# Patient Record
Sex: Female | Born: 1985
Health system: Southern US, Community
[De-identification: ages and names within clinical notes are randomized; demographics above are authoritative.]

## PROBLEM LIST (undated history)

## (undated) ENCOUNTER — Emergency Department (HOSPITAL_BASED_OUTPATIENT_CLINIC_OR_DEPARTMENT_OTHER): Admission: EM | Payer: Self-pay | Source: Home / Self Care

## (undated) DIAGNOSIS — R87619 Unspecified abnormal cytological findings in specimens from cervix uteri: Secondary | ICD-10-CM

## (undated) DIAGNOSIS — Z8759 Personal history of other complications of pregnancy, childbirth and the puerperium: Secondary | ICD-10-CM

## (undated) HISTORY — PX: DILATION AND CURETTAGE OF UTERUS: SHX78

## (undated) HISTORY — DX: Personal history of other complications of pregnancy, childbirth and the puerperium: Z87.59

## (undated) HISTORY — PX: COLPOSCOPY: SHX161

## (undated) HISTORY — PX: SIGMOIDOSCOPY: SUR1295

---

## 2011-10-29 ENCOUNTER — Encounter (HOSPITAL_COMMUNITY): Payer: Self-pay | Admitting: Emergency Medicine

## 2011-10-29 ENCOUNTER — Emergency Department (HOSPITAL_COMMUNITY)
Admission: EM | Admit: 2011-10-29 | Discharge: 2011-10-29 | Disposition: A | Discharge status: Home / Self Care | Payer: No Typology Code available for payment source | Attending: Emergency Medicine | Admitting: Emergency Medicine

## 2011-10-29 DIAGNOSIS — R51 Headache: Secondary | ICD-10-CM

## 2011-10-29 DIAGNOSIS — S335XXA Sprain of ligaments of lumbar spine, initial encounter: Secondary | ICD-10-CM

## 2011-10-29 MED ORDER — IBUPROFEN 600 MG PO TABS: 600.0000 mg | ORAL_TABLET | Freq: Four times a day (QID) | ORAL | Status: DC | PRN

## 2011-10-29 MED ORDER — CYCLOBENZAPRINE HCL 10 MG PO TABS: 10.0000 mg | ORAL_TABLET | Freq: Two times a day (BID) | ORAL | Status: DC | PRN
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
## 2011-10-29 NOTE — ED Notes (Signed)
Patient is alert and oriented x3.  She was given DC instructions and follow up visit instructions.  Patient gave verbal understanding. She was DC ambulatory under his own power to home.  V/S stable.  He was not showing any signs of distress on DC 

## 2011-10-29 NOTE — ED Notes (Signed)
Patient is alert and oriented x3.  She is complaining of headache rated 7 of 10 with  Dizziness and lower back pain.

## 2011-11-08 NOTE — ED Provider Notes (Signed)
Medical screening examination/treatment/procedure(s) were performed by non-physician practitioner and as supervising physician I was immediately available for consultation/collaboration.  John-Adam Harshita Bernales, M.D.     John-Adam Shayn Madole, MD 11/08/11 1422 

## 2013-09-01 DIAGNOSIS — K625 Hemorrhage of anus and rectum: Secondary | ICD-10-CM | POA: Insufficient documentation

## 2013-10-08 DIAGNOSIS — IMO0002 Reserved for concepts with insufficient information to code with codable children: Secondary | ICD-10-CM | POA: Insufficient documentation

## 2016-08-30 DIAGNOSIS — Z1321 Encounter for screening for nutritional disorder: Secondary | ICD-10-CM | POA: Diagnosis not present

## 2016-08-30 DIAGNOSIS — Z309 Encounter for contraceptive management, unspecified: Secondary | ICD-10-CM | POA: Diagnosis not present

## 2016-08-30 DIAGNOSIS — Z0000 Encounter for general adult medical examination without abnormal findings: Secondary | ICD-10-CM | POA: Diagnosis not present

## 2016-08-30 DIAGNOSIS — Z6820 Body mass index (BMI) 20.0-20.9, adult: Secondary | ICD-10-CM | POA: Diagnosis not present

## 2016-08-30 DIAGNOSIS — Z01419 Encounter for gynecological examination (general) (routine) without abnormal findings: Secondary | ICD-10-CM | POA: Diagnosis not present

## 2016-08-30 DIAGNOSIS — Z113 Encounter for screening for infections with a predominantly sexual mode of transmission: Secondary | ICD-10-CM | POA: Diagnosis not present

## 2016-09-26 ENCOUNTER — Ambulatory Visit (INDEPENDENT_AMBULATORY_CARE_PROVIDER_SITE_OTHER): Payer: BLUE CROSS/BLUE SHIELD

## 2016-09-26 DIAGNOSIS — N912 Amenorrhea, unspecified: Secondary | ICD-10-CM

## 2016-09-26 DIAGNOSIS — Z3201 Encounter for pregnancy test, result positive: Secondary | ICD-10-CM | POA: Diagnosis not present

## 2016-09-26 LAB — POCT URINE PREGNANCY: PREG TEST UR: POSITIVE — AB

## 2016-09-26 NOTE — Progress Notes (Signed)
Pt here for a pregnancy test. Test today is positive. Last LMP 08/24/16. Pt is [redacted]w[redacted]d. EDD 05/31/17

## 2016-10-16 ENCOUNTER — Encounter: Payer: Self-pay | Admitting: Obstetrics

## 2016-10-16 ENCOUNTER — Ambulatory Visit (INDEPENDENT_AMBULATORY_CARE_PROVIDER_SITE_OTHER): Payer: BLUE CROSS/BLUE SHIELD | Admitting: Obstetrics

## 2016-10-16 ENCOUNTER — Telehealth: Payer: Self-pay | Admitting: *Deleted

## 2016-10-16 VITALS — BP 115/76 | HR 80 | Ht 65.5 in | Wt 130.4 lb

## 2016-10-16 DIAGNOSIS — Z113 Encounter for screening for infections with a predominantly sexual mode of transmission: Secondary | ICD-10-CM | POA: Diagnosis not present

## 2016-10-16 DIAGNOSIS — N898 Other specified noninflammatory disorders of vagina: Secondary | ICD-10-CM

## 2016-10-16 NOTE — Progress Notes (Signed)
Patient ID: Kristi [name], female   DOB: 1985/09/05, 31 y.o.   MRN: [id number]  Chief Complaint  Patient presents with  . GYN    HPI Kristi [name] is a 31 y.o. female.  Vaginal discharge and irritation.  Denies vaginal odor.  Denies pelvic pain. HPI  History reviewed. No pertinent past medical history.  History reviewed. No pertinent surgical history.  History reviewed. No pertinent family history.  Social History Social History  Substance Use Topics  . Smoking status: Never Smoker  . Smokeless tobacco: Never Used  . Alcohol use No     Comment: Occasionally     No Known Allergies  Current Outpatient Prescriptions  Medication Sig Dispense Refill  . Prenatal Vit-Fe Fumarate-FA (PRENATAL MULTIVITAMIN) TABS tablet Take 1 tablet by mouth daily at 12 noon.    . cyclobenzaprine (FLEXERIL) 10 MG tablet Take 1 tablet (10 mg total) by mouth 2 (two) times daily as needed for muscle spasms. (Patient not taking: Reported on 10/16/2016) 20 tablet 0  . ibuprofen (ADVIL,MOTRIN) 600 MG tablet Take 1 tablet (600 mg total) by mouth every 6 (six) hours as needed for pain. (Patient not taking: Reported on 10/16/2016) 30 tablet 0   No current facility-administered medications for this visit.     Review of Systems Review of Systems Constitutional: negative for fatigue and weight loss Respiratory: negative for cough and wheezing Cardiovascular: negative for chest pain, fatigue and palpitations Gastrointestinal: negative for abdominal pain and change in bowel habits Genitourinary:positive for vaginal discharge and irritation Integument/breast: negative for nipple discharge Musculoskeletal:negative for myalgias Neurological: negative for gait problems and tremors Behavioral/Psych: negative for abusive relationship, depression Endocrine: negative for temperature intolerance      Blood pressure 115/76, pulse 80, height 5' 5.5" (1.664 m), weight 130 lb 6.4 oz (59.1 kg).  Physical Exam Physical  Exam           General:  Alert and no distress Abdomen:  normal findings: no organomegaly, soft, non-tender and no hernia  Pelvis:  External genitalia: normal general appearance Urinary system: urethral meatus normal and bladder without fullness, nontender Vaginal: normal without tenderness, induration or masses Cervix: normal appearance Adnexa: normal bimanual exam Uterus: anteverted and non-tender, normal size    50% of 15 min visit spent on counseling and coordination of care.    Data Reviewed Wet prep and Cultures  Assessment        1. Vaginal discharge Rx: - Cervicovaginal ancillary only  2. Vaginal irritation Rx: - Cervicovaginal ancillary only   Plan    No orders of the defined types were placed in this encounter.  Meds ordered this encounter  Medications  . Prenatal Vit-Fe Fumarate-FA (PRENATAL MULTIVITAMIN) TABS tablet    Sig: Take 1 tablet by mouth daily at 12 noon.

## 2016-10-16 NOTE — Telephone Encounter (Signed)
Patient is having BV/yeast- she has a NOB appointment on 10/10.   Appointment to address the discharge today.

## 2016-10-16 NOTE — Progress Notes (Signed)
Patient is in the office and reports vaginal discharge and discomfort. Pt has NOB appt scheduled for 11-08-16.

## 2016-10-17 ENCOUNTER — Other Ambulatory Visit: Payer: Self-pay | Admitting: Obstetrics

## 2016-10-17 DIAGNOSIS — B3731 Acute candidiasis of vulva and vagina: Secondary | ICD-10-CM

## 2016-10-17 DIAGNOSIS — B373 Candidiasis of vulva and vagina: Secondary | ICD-10-CM

## 2016-10-17 LAB — CERVICOVAGINAL ANCILLARY ONLY
BACTERIAL VAGINITIS: NEGATIVE
CANDIDA VAGINITIS: POSITIVE — AB
CHLAMYDIA, DNA PROBE: NEGATIVE
NEISSERIA GONORRHEA: NEGATIVE
TRICH (WINDOWPATH): NEGATIVE

## 2016-10-17 MED ORDER — FLUCONAZOLE 150 MG PO TABS: 150.0000 mg | ORAL_TABLET | Freq: Once | ORAL | 0 refills | Status: AC

## 2016-11-08 ENCOUNTER — Encounter: Payer: Self-pay | Admitting: Certified Nurse Midwife

## 2016-11-08 ENCOUNTER — Ambulatory Visit (INDEPENDENT_AMBULATORY_CARE_PROVIDER_SITE_OTHER): Payer: BLUE CROSS/BLUE SHIELD | Admitting: Certified Nurse Midwife

## 2016-11-08 VITALS — BP 111/74 | HR 97 | Wt 134.0 lb

## 2016-11-08 DIAGNOSIS — N760 Acute vaginitis: Secondary | ICD-10-CM

## 2016-11-08 DIAGNOSIS — Z3400 Encounter for supervision of normal first pregnancy, unspecified trimester: Secondary | ICD-10-CM | POA: Diagnosis not present

## 2016-11-08 DIAGNOSIS — Z124 Encounter for screening for malignant neoplasm of cervix: Secondary | ICD-10-CM

## 2016-11-08 DIAGNOSIS — Z3490 Encounter for supervision of normal pregnancy, unspecified, unspecified trimester: Secondary | ICD-10-CM

## 2016-11-08 DIAGNOSIS — Z113 Encounter for screening for infections with a predominantly sexual mode of transmission: Secondary | ICD-10-CM

## 2016-11-08 DIAGNOSIS — Z3401 Encounter for supervision of normal first pregnancy, first trimester: Secondary | ICD-10-CM | POA: Diagnosis not present

## 2016-11-08 DIAGNOSIS — B9689 Other specified bacterial agents as the cause of diseases classified elsewhere: Secondary | ICD-10-CM

## 2016-11-08 DIAGNOSIS — R87629 Unspecified abnormal cytological findings in specimens from vagina: Secondary | ICD-10-CM | POA: Insufficient documentation

## 2016-11-08 NOTE — Progress Notes (Signed)
Subjective:    Kristi [name] is being seen today for her first obstetrical visit.  This is a planned pregnancy. She is at [redacted]w[redacted]d gestation. Her obstetrical history is significant for none. Relationship with FOB: significant other, living together. Patient does intend to breast feed. Pregnancy history fully reviewed.  The information documented in the HPI was reviewed and verified.  Menstrual History: OB History    Gravida Para Term Preterm AB Living   1             SAB TAB Ectopic Multiple Live Births                   Patient's last menstrual period was 08/24/2016. Period Pattern: Regular  Past Medical History:  Diagnosis Date  . Vaginal Pap smear, abnormal     Past Surgical History:  Procedure Laterality Date  . COLPOSCOPY  2013-2014     (Not in a hospital admission) No Known Allergies  Social History  Substance Use Topics  . Smoking status: Never Smoker  . Smokeless tobacco: Never Used  . Alcohol use No     Comment: Occasionally     Family History  Problem Relation Age of Onset  . Hypertension Mother   . Stroke Mother   . Hypertension Father   . Cancer Maternal Grandfather   . Cancer Paternal Grandmother        ovarian  . Diabetes Paternal Grandmother      Review of Systems Constitutional: negative for weight loss Gastrointestinal: negative for vomiting Genitourinary:negative for genital lesions and vaginal discharge and dysuria Musculoskeletal:negative for back pain Behavioral/Psych: negative for abusive relationship, depression, illegal drug usage and tobacco use    Objective:    BP 111/74   Pulse 97   Wt 134 lb (60.8 kg)   LMP 08/24/2016   BMI 21.96 kg/m  General Appearance:    Alert, cooperative, no distress, appears stated age  Head:    Normocephalic, without obvious abnormality, atraumatic  Eyes:    PERRL, conjunctiva/corneas clear, EOM's intact, fundi    benign, both eyes  Ears:    Normal TM's and external ear canals, both ears  Nose:    Nares normal, septum midline, mucosa normal, no drainage    or sinus tenderness  Throat:   Lips, mucosa, and tongue normal; teeth and gums normal  Neck:   Supple, symmetrical, trachea midline, no adenopathy;    thyroid:  no enlargement/tenderness/nodules; no carotid   bruit or JVD  Back:     Symmetric, no curvature, ROM normal, no CVA tenderness  Lungs:     Clear to auscultation bilaterally, respirations unlabored  Chest Wall:    No tenderness or deformity   Heart:    Regular rate and rhythm, S1 and S2 normal, no murmur, rub   or gallop  Breast Exam:    No tenderness, masses, or nipple abnormality  Abdomen:     Soft, non-tender, bowel sounds active all four quadrants,    no masses, no organomegaly  Genitalia:    Normal female without lesion, discharge or tenderness  Extremities:   Extremities normal, atraumatic, no cyanosis or edema  Pulses:   2+ and symmetric all extremities  Skin:   Skin color, texture, turgor normal, no rashes or lesions  Lymph nodes:   Cervical, supraclavicular, and axillary nodes normal  Neurologic:   CNII-XII intact, normal strength, sensation and reflexes    throughout  Cervix:  Long, thick, closed and posterior.  FHR: 175 by doppler.  Size c/w LMP.     Lab Review Urine pregnancy test Labs reviewed yes Radiologic studies reviewed no  Assessment & Plan    Pregnancy at [redacted]w[redacted]d weeks    1. Supervision of normal first pregnancy, antepartum    - Hemoglobinopathy evaluation - Varicella zoster antibody, IgG - MaterniT21 PLUS Core+SCA - Hemoglobin A1c - Culture, OB Urine - Cytology - PAP - Cervicovaginal ancillary only  2. Prenatal care, antepartum    - Obstetric Panel, Including HIV - Inheritest Society Guided     Prenatal vitamins.  Counseling provided regarding continued use of seat belts, cessation of alcohol consumption, smoking or use of illicit drugs; infection precautions i.e., influenza/TDAP immunizations, toxoplasmosis,CMV,  parvovirus, listeria and varicella; workplace safety, exercise during pregnancy; routine dental care, safe medications, sexual activity, hot tubs, saunas, pools, travel, caffeine use, fish and methlymercury, potential toxins, hair treatments, varicose veins Weight gain recommendations per IOM guidelines reviewed: underweight/BMI< 18.5--> gain 28 - 40 lbs; normal weight/BMI 18.5 - 24.9--> gain 25 - 35 lbs; overweight/BMI 25 - 29.9--> gain 15 - 25 lbs; obese/BMI >30->gain  11 - 20 lbs Problem list reviewed and updated. FIRST/CF mutation testing/NIPT/QUAD SCREEN/fragile X/Ashkenazi Jewish population testing/Spinal muscular atrophy discussed: ordered. Role of ultrasound in pregnancy discussed; fetal survey: requested. Amniocentesis discussed: not indicated.   Orders Placed This Encounter  Procedures  . Culture, OB Urine  . Obstetric Panel, Including HIV  . Inheritest Society Guided  . Hemoglobinopathy evaluation  . Varicella zoster antibody, IgG  . MaterniT21 PLUS Core+SCA    Order Specific Question:   Is the patient insulin dependent?    Answer:   No    Order Specific Question:   Please enter gestational age. This should be expressed as weeks AND days, i.e. 16w 6d. Enter weeks here. Enter days in next question.    Answer:   20    Order Specific Question:   Please enter gestational age. This should be expressed as weeks AND days, i.e. 16w 6d. Enter days here. Enter weeks in previous question.    Answer:   6    Order Specific Question:   How was gestational age calculated?    Answer:   LMP    Order Specific Question:   Please give the date of LMP OR Ultrasound OR Estimated date of delivery.    Answer:   05/31/2017    Order Specific Question:   Number of Fetuses (Type of Pregnancy):    Answer:   1    Order Specific Question:   Indications for performing the test? (please choose all that apply):    Answer:   Routine screening    Order Specific Question:   Other Indications? (Y=Yes, N=No)     Answer:   N    Order Specific Question:   If this is a repeat specimen, please indicate the reason:    Answer:   Not indicated    Order Specific Question:   Please specify the patient's race: (C=White/Caucasion, B=Black, I=Native American, A=Asian, H=Hispanic, O=Other, U=Unknown)    Answer:   B    Order Specific Question:   Donor Egg - indicate if the egg was obtained from in vitro fertilization.    Answer:   N    Order Specific Question:   Age of Egg Donor.    Answer:   28    Order Specific Question:   Prior Down Syndrome/ONTD screening during current pregnancy.  Answer:   N    Order Specific Question:   Prior First Trimester Testing    Answer:   N    Order Specific Question:   Prior Second Trimester Testing    Answer:   N    Order Specific Question:   Family History of Neural Tube Defects    Answer:   N    Order Specific Question:   Prior Pregnancy with Down Syndrome    Answer:   N    Order Specific Question:   Please give the patient's weight (in pounds)    Answer:   134  . Hemoglobin A1c    Follow up in 4 weeks. 50% of 30 min visit spent on counseling and coordination of care.

## 2016-11-09 ENCOUNTER — Other Ambulatory Visit: Payer: Self-pay

## 2016-11-09 LAB — CERVICOVAGINAL ANCILLARY ONLY
Bacterial vaginitis: POSITIVE — AB
CANDIDA VAGINITIS: NEGATIVE
CHLAMYDIA, DNA PROBE: NEGATIVE
NEISSERIA GONORRHEA: NEGATIVE

## 2016-11-09 MED ORDER — METRONIDAZOLE 0.75 % VA GEL: 1.0000 | Freq: Two times a day (BID) | VAGINAL | 0 refills | Status: DC

## 2016-11-09 NOTE — Addendum Note (Signed)
Addended by: Orvilla Cornwall A on: 11/09/2016 01:17 PM   Modules accepted: Orders

## 2016-11-10 LAB — HEMOGLOBINOPATHY EVALUATION
HEMOGLOBIN A2 QUANTITATION: 2.3 % (ref 1.8–3.2)
HEMOGLOBIN F QUANTITATION: 0 % (ref 0.0–2.0)
HGB C: 0 %
HGB S: 0 %
HGB VARIANT: 0 %
Hgb A: 97.7 % (ref 96.4–98.8)

## 2016-11-10 LAB — CULTURE, OB URINE

## 2016-11-10 LAB — OBSTETRIC PANEL, INCLUDING HIV
ANTIBODY SCREEN: NEGATIVE
BASOS ABS: 0 10*3/uL (ref 0.0–0.2)
BASOS: 0 %
EOS (ABSOLUTE): 0.3 10*3/uL (ref 0.0–0.4)
Eos: 4 %
HEMATOCRIT: 33.5 % — AB (ref 34.0–46.6)
HIV SCREEN 4TH GENERATION: NONREACTIVE
Hemoglobin: 11.8 g/dL (ref 11.1–15.9)
Hepatitis B Surface Ag: NEGATIVE
Immature Grans (Abs): 0.1 10*3/uL (ref 0.0–0.1)
Immature Granulocytes: 1 %
LYMPHS ABS: 1.3 10*3/uL (ref 0.7–3.1)
Lymphs: 19 %
MCH: 31.4 pg (ref 26.6–33.0)
MCHC: 35.2 g/dL (ref 31.5–35.7)
MCV: 89 fL (ref 79–97)
MONOCYTES: 5 %
MONOS ABS: 0.3 10*3/uL (ref 0.1–0.9)
NEUTROS ABS: 5.2 10*3/uL (ref 1.4–7.0)
Neutrophils: 71 %
PLATELETS: 220 10*3/uL (ref 150–379)
RBC: 3.76 x10E6/uL — ABNORMAL LOW (ref 3.77–5.28)
RDW: 13.6 % (ref 12.3–15.4)
RPR Ser Ql: NONREACTIVE
RUBELLA: 1.89 {index} (ref >0.99)
Rh Factor: NEGATIVE
WBC: 7.2 10*3/uL (ref 3.4–10.8)

## 2016-11-10 LAB — URINE CULTURE, OB REFLEX

## 2016-11-10 LAB — CYTOLOGY - PAP: Diagnosis: NEGATIVE

## 2016-11-10 LAB — VARICELLA ZOSTER ANTIBODY, IGG: VARICELLA: 1498 {index} (ref >165)

## 2016-11-10 LAB — HEMOGLOBIN A1C
ESTIMATED AVERAGE GLUCOSE: 94 mg/dL
Hgb A1c MFr Bld: 4.9 % (ref 4.8–5.6)

## 2016-11-13 ENCOUNTER — Other Ambulatory Visit: Payer: Self-pay | Admitting: Certified Nurse Midwife

## 2016-11-13 DIAGNOSIS — Z3400 Encounter for supervision of normal first pregnancy, unspecified trimester: Secondary | ICD-10-CM

## 2016-11-13 DIAGNOSIS — O26899 Other specified pregnancy related conditions, unspecified trimester: Secondary | ICD-10-CM

## 2016-11-13 DIAGNOSIS — Z6791 Unspecified blood type, Rh negative: Secondary | ICD-10-CM | POA: Insufficient documentation

## 2016-11-13 LAB — MATERNIT21 PLUS CORE+SCA
CHROMOSOME 18: NEGATIVE
Chromosome 13: NEGATIVE
Chromosome 21: NEGATIVE
Y CHROMOSOME: DETECTED

## 2016-11-20 LAB — INHERITEST SOCIETY GUIDED

## 2016-11-21 ENCOUNTER — Other Ambulatory Visit: Payer: Self-pay | Admitting: Certified Nurse Midwife

## 2016-11-22 ENCOUNTER — Other Ambulatory Visit: Payer: Self-pay | Admitting: Certified Nurse Midwife

## 2016-11-22 DIAGNOSIS — Z3400 Encounter for supervision of normal first pregnancy, unspecified trimester: Secondary | ICD-10-CM

## 2016-11-28 ENCOUNTER — Telehealth: Payer: Self-pay | Admitting: Pediatrics

## 2016-11-28 MED ORDER — TERCONAZOLE 0.8 % VA CREA: 1.0000 | TOPICAL_CREAM | Freq: Every day | VAGINAL | 0 refills | Status: DC

## 2016-11-28 NOTE — Telephone Encounter (Signed)
Pt states she has yeast infection. Terazol sent in per standing order.

## 2016-12-05 ENCOUNTER — Encounter: Payer: Self-pay | Admitting: Certified Nurse Midwife

## 2016-12-05 ENCOUNTER — Ambulatory Visit (INDEPENDENT_AMBULATORY_CARE_PROVIDER_SITE_OTHER): Payer: BLUE CROSS/BLUE SHIELD | Admitting: Certified Nurse Midwife

## 2016-12-05 VITALS — BP 106/68 | HR 85 | Wt 136.4 lb

## 2016-12-05 DIAGNOSIS — O26899 Other specified pregnancy related conditions, unspecified trimester: Secondary | ICD-10-CM

## 2016-12-05 DIAGNOSIS — Z6791 Unspecified blood type, Rh negative: Secondary | ICD-10-CM

## 2016-12-05 DIAGNOSIS — O09899 Supervision of other high risk pregnancies, unspecified trimester: Secondary | ICD-10-CM

## 2016-12-05 DIAGNOSIS — Z3400 Encounter for supervision of normal first pregnancy, unspecified trimester: Secondary | ICD-10-CM

## 2016-12-05 NOTE — Progress Notes (Signed)
Patient is in the office for ob visit, denies pain.

## 2016-12-05 NOTE — Progress Notes (Signed)
   PRENATAL VISIT NOTE  Subjective:  Kristi [name] is a 31 y.o. G1P0 at 269w5d being seen today for ongoing prenatal care.  She is currently monitored for the following issues for this low-risk pregnancy and has Supervision of normal first pregnancy, antepartum and Rh negative state in antepartum period on their problem list.  Patient reports no complaints.  Contractions: Not present. Vag. Bleeding: None.   . Denies leaking of fluid.   The following portions of the patient's history were reviewed and updated as appropriate: allergies, current medications, past family history, past medical history, past social history, past surgical history and problem list. Problem list updated.  Objective:   Vitals:   12/05/16 1018  BP: 106/68  Pulse: 85  Weight: 136 lb 6.4 oz (61.9 kg)    Fetal Status: Fetal Heart Rate (bpm): 155; doppler         General:  Alert, oriented and cooperative. Patient is in no acute distress.  Skin: Skin is warm and dry. No rash noted.   Cardiovascular: Normal heart rate noted  Respiratory: Normal respiratory effort, no problems with respiration noted  Abdomen: Soft, gravid, appropriate for gestational age.  Pain/Pressure: Absent     Pelvic: Cervical exam deferred        Extremities: Normal range of motion.  Edema: None  Mental Status:  Normal mood and affect. Normal behavior. Normal judgment and thought content.   Assessment and Plan:  Pregnancy: G1P0 at 6369w5d  1. Supervision of normal first pregnancy, antepartum     Doing well.    2. Rh negative state in antepartum period     Rhogam at 28 wks.   Preterm labor symptoms and general obstetric precautions including but not limited to vaginal bleeding, contractions, leaking of fluid and fetal movement were reviewed in detail with the patient. Please refer to After Visit Summary for other counseling recommendations.  Return in about 4 weeks (around 01/02/2017) for ROB.   Roe Coombsachelle A Denney, CNM

## 2016-12-06 ENCOUNTER — Encounter: Payer: BLUE CROSS/BLUE SHIELD | Admitting: Certified Nurse Midwife

## 2016-12-26 ENCOUNTER — Other Ambulatory Visit: Payer: Self-pay | Admitting: Certified Nurse Midwife

## 2016-12-26 ENCOUNTER — Other Ambulatory Visit: Payer: Self-pay

## 2016-12-26 MED ORDER — TERCONAZOLE 0.4 % VA CREA: 1.0000 | TOPICAL_CREAM | Freq: Every day | VAGINAL | 0 refills | Status: DC

## 2016-12-27 ENCOUNTER — Other Ambulatory Visit: Payer: Self-pay | Admitting: Certified Nurse Midwife

## 2016-12-27 DIAGNOSIS — B9689 Other specified bacterial agents as the cause of diseases classified elsewhere: Secondary | ICD-10-CM

## 2016-12-27 DIAGNOSIS — N760 Acute vaginitis: Principal | ICD-10-CM

## 2016-12-27 MED ORDER — METRONIDAZOLE 0.75 % VA GEL: 1.0000 | Freq: Two times a day (BID) | VAGINAL | 0 refills | Status: DC

## 2016-12-28 ENCOUNTER — Encounter (HOSPITAL_COMMUNITY): Payer: Self-pay | Admitting: Certified Nurse Midwife

## 2017-01-04 ENCOUNTER — Encounter: Payer: Self-pay | Admitting: Certified Nurse Midwife

## 2017-01-04 ENCOUNTER — Ambulatory Visit (INDEPENDENT_AMBULATORY_CARE_PROVIDER_SITE_OTHER): Payer: BLUE CROSS/BLUE SHIELD | Admitting: Certified Nurse Midwife

## 2017-01-04 VITALS — BP 119/67 | HR 106 | Wt 144.0 lb

## 2017-01-04 DIAGNOSIS — O26899 Other specified pregnancy related conditions, unspecified trimester: Secondary | ICD-10-CM

## 2017-01-04 DIAGNOSIS — O09892 Supervision of other high risk pregnancies, second trimester: Secondary | ICD-10-CM

## 2017-01-04 DIAGNOSIS — Z6791 Unspecified blood type, Rh negative: Secondary | ICD-10-CM

## 2017-01-04 DIAGNOSIS — Z3402 Encounter for supervision of normal first pregnancy, second trimester: Secondary | ICD-10-CM

## 2017-01-04 DIAGNOSIS — Z3400 Encounter for supervision of normal first pregnancy, unspecified trimester: Secondary | ICD-10-CM

## 2017-01-04 NOTE — Progress Notes (Signed)
   PRENATAL VISIT NOTE  Subjective:  Kristi [name] is a 31 y.o. G1P0 at 2548w0d being seen today for ongoing prenatal care.  She is currently monitored for the following issues for this low-risk pregnancy and has Supervision of normal first pregnancy, antepartum and Rh negative state in antepartum period on their problem list.  Patient reports no complaints.  Contractions: Not present. Vag. Bleeding: None.  Movement: Present. Denies leaking of fluid.   The following portions of the patient's history were reviewed and updated as appropriate: allergies, current medications, past family history, past medical history, past social history, past surgical history and problem list. Problem list updated.  Objective:   Vitals:   01/04/17 1014  BP: 119/67  Pulse: (!) 106  Weight: 144 lb (65.3 kg)    Fetal Status: Fetal Heart Rate (bpm): 152; doppler Fundal Height: 19 cm Movement: Present     General:  Alert, oriented and cooperative. Patient is in no acute distress.  Skin: Skin is warm and dry. No rash noted.   Cardiovascular: Normal heart rate noted  Respiratory: Normal respiratory effort, no problems with respiration noted  Abdomen: Soft, gravid, appropriate for gestational age.  Pain/Pressure: Absent     Pelvic: Cervical exam deferred        Extremities: Normal range of motion.     Mental Status:  Normal mood and affect. Normal behavior. Normal judgment and thought content.   Assessment and Plan:  Pregnancy: G1P0 at 2948w0d  1. Supervision of normal first pregnancy, antepartum      Doing well  2. Rh negative state in antepartum period     Rhogam @28  wks  Preterm labor symptoms and general obstetric precautions including but not limited to vaginal bleeding, contractions, leaking of fluid and fetal movement were reviewed in detail with the patient. Please refer to After Visit Summary for other counseling recommendations.  Return in about 4 weeks (around 02/01/2017) for ROB.   Roe Coombsachelle A  Lurline Caver, CNM

## 2017-01-04 NOTE — Patient Instructions (Signed)
AREA PEDIATRIC/FAMILY PRACTICE PHYSICIANS  [facility] [address], [address] [place], [place]  [address] Phone - [phone]   Fax - [phone]  [facility] [address] [place], [place] [address] Phone - [phone]   Fax - [phone]  [facility] [address] [place], [place]  [address] Phone - [phone]   Fax - [phone]  [facility] [address] [place], [place]  [address] Phone - [phone]   Fax - [phone]  [facility] [address] [place], [place]  [address] Phone - [phone]   Fax - [phone]  [facility] [address], [address] [place], [place]  [address] Phone - [phone]   Fax - [phone]  [facility] [address] [place], [place]  [address] Phone - [phone]   Fax - [phone]  [facility] [address], [address] [place], [place]  [address] Phone - [phone]   Fax - [phone]  [facility] [address] [place], [place]  [address] Phone - [phone]   Fax - [phone] [facility] [address] [place], [place]  [address] Phone - [phone]   Fax - [phone]  [facility] [address] [place], [place]  [address] Phone - [phone]   Fax - [phone]  [facility] [address] [place], [place]  [address] Phone - [phone]   Fax - [phone]  [facility] [address], [address] [place], [place]  [address] Phone - [phone]   Fax - [phone]  [facility] [address] [place], [place]  [address] Phone - [phone]  [facility] [address] [place], [place]  [address] Phone - [phone]   Fax - [phone]  [facility] [address] [place], [place]  [address] Phone - [phone]   Fax - [phone]  [facility] [address] [place], [place]  [address] Phone - [phone]   Fax - [phone]  [facility] [address] [place], [place]  [address] Phone - [phone]   Fax - [phone]   [facility] [address] [place], [place]  [address] Phone - [phone]   Fax - [phone]  [facility] [address] [place], [place]  [address] Phone - [phone]   Fax - [phone]  [facility] [address] [place], [place]  [address] Phone - [phone]   Fax - [phone]  [facility] [address] [place], [place]  [address] Phone - [phone]   Fax - [phone]  [facility] [address] , [place]  [address] Phone - [phone]   Fax - [phone] [facility] [address] [place], [place]  [address] Phone - [phone]   Fax - [phone]  [facility] [address] [place], [place]  [address] Phone - [phone]   Fax - [phone]  [facility] [address] [place],   [address] Phone - [phone]   Fax - [phone]  [facility] [place] Charlene Flemming MD [address]   [address] Phone [phone]  Fax [phone]   

## 2017-01-05 ENCOUNTER — Other Ambulatory Visit: Payer: Self-pay | Admitting: Certified Nurse Midwife

## 2017-01-05 ENCOUNTER — Ambulatory Visit (HOSPITAL_COMMUNITY)
Admission: RE | Admit: 2017-01-05 | Discharge: 2017-01-05 | Disposition: A | Discharge status: Home / Self Care | Payer: BLUE CROSS/BLUE SHIELD | Source: Ambulatory Visit | Attending: Certified Nurse Midwife | Admitting: Certified Nurse Midwife

## 2017-01-05 DIAGNOSIS — O360120 Maternal care for anti-D [Rh] antibodies, second trimester, not applicable or unspecified: Secondary | ICD-10-CM

## 2017-01-05 DIAGNOSIS — Z3400 Encounter for supervision of normal first pregnancy, unspecified trimester: Secondary | ICD-10-CM

## 2017-01-05 DIAGNOSIS — Z369 Encounter for antenatal screening, unspecified: Secondary | ICD-10-CM

## 2017-01-05 DIAGNOSIS — Z363 Encounter for antenatal screening for malformations: Secondary | ICD-10-CM | POA: Insufficient documentation

## 2017-01-05 DIAGNOSIS — Z6791 Unspecified blood type, Rh negative: Secondary | ICD-10-CM | POA: Diagnosis not present

## 2017-01-05 DIAGNOSIS — Z3A19 19 weeks gestation of pregnancy: Secondary | ICD-10-CM | POA: Diagnosis not present

## 2017-01-05 DIAGNOSIS — O26892 Other specified pregnancy related conditions, second trimester: Secondary | ICD-10-CM | POA: Diagnosis not present

## 2017-01-05 DIAGNOSIS — Z3A39 39 weeks gestation of pregnancy: Secondary | ICD-10-CM | POA: Diagnosis not present

## 2017-01-05 DIAGNOSIS — Z3689 Encounter for other specified antenatal screening: Secondary | ICD-10-CM | POA: Diagnosis not present

## 2017-01-09 ENCOUNTER — Ambulatory Visit (HOSPITAL_COMMUNITY): Payer: No Typology Code available for payment source

## 2017-01-23 DIAGNOSIS — B373 Candidiasis of vulva and vagina: Secondary | ICD-10-CM | POA: Diagnosis not present

## 2017-01-23 DIAGNOSIS — Z3A21 21 weeks gestation of pregnancy: Secondary | ICD-10-CM | POA: Diagnosis not present

## 2017-01-23 DIAGNOSIS — N760 Acute vaginitis: Secondary | ICD-10-CM | POA: Diagnosis not present

## 2017-01-23 DIAGNOSIS — B9689 Other specified bacterial agents as the cause of diseases classified elsewhere: Secondary | ICD-10-CM | POA: Diagnosis not present

## 2017-01-30 NOTE — L&D Delivery Note (Signed)
32 y.o. G1P0 at 294w1d delivered a viable female infant in cephalic, ROA position. There was no nuchal cord. The anterior shoulder delivered with ease. A 3-4 minute delayed cord clamping was performed, per mother's wishes. Cord clamped x2 and cut. Placenta delivered spontaneously intact, with 3VC. Fundus firm on exam with massage and pitocin. Initially there was some brisk bleeding from a left sulcal tear, that resolved with suturing. Cytotec was also administered to help with uterine bleeding. Good hemostasis was then noted.  Anesthesia: Epidural Laceration: Left sulcal  Suture: 2-0 Vicryl Good hemostasis noted. EBL: 1200cc  Mom and baby recovering in LDR.    Apgars: APGAR (1 MIN): 9 APGAR (5 MINS): 9  Weight: Pending skin to skin  Gorden HarmsMegan Campbell, MD PGY-3 05/25/2017, 9:21 PM  OB FELLOW DELIVERY ATTESTATION  I was gloved and present for the delivery in its entirety, and I agree with the above resident's note.    -Mother with postpartum temperature shortly after delivery and baby warm. Will give single dose of antibiotics for prophylaxis.   Caryl AdaJazma Keyonda Bickle, DO OB Fellow 9:42 PM

## 2017-02-05 ENCOUNTER — Encounter: Payer: BLUE CROSS/BLUE SHIELD | Admitting: Certified Nurse Midwife

## 2017-02-07 ENCOUNTER — Encounter: Payer: Self-pay | Admitting: Certified Nurse Midwife

## 2017-02-07 ENCOUNTER — Ambulatory Visit (INDEPENDENT_AMBULATORY_CARE_PROVIDER_SITE_OTHER): Payer: BLUE CROSS/BLUE SHIELD | Admitting: Certified Nurse Midwife

## 2017-02-07 VITALS — BP 97/67 | HR 82 | Wt 153.4 lb

## 2017-02-07 DIAGNOSIS — Z3400 Encounter for supervision of normal first pregnancy, unspecified trimester: Secondary | ICD-10-CM

## 2017-02-07 DIAGNOSIS — B373 Candidiasis of vulva and vagina: Secondary | ICD-10-CM

## 2017-02-07 DIAGNOSIS — Z3402 Encounter for supervision of normal first pregnancy, second trimester: Secondary | ICD-10-CM

## 2017-02-07 DIAGNOSIS — B3731 Acute candidiasis of vulva and vagina: Secondary | ICD-10-CM

## 2017-02-07 MED ORDER — FLUCONAZOLE 150 MG PO TABS: 150.0000 mg | ORAL_TABLET | Freq: Once | ORAL | 0 refills | Status: AC

## 2017-02-07 MED ORDER — TERCONAZOLE 0.8 % VA CREA: 1.0000 | TOPICAL_CREAM | Freq: Every day | VAGINAL | 0 refills | Status: DC

## 2017-02-07 NOTE — Patient Instructions (Addendum)
Second Trimester of Pregnancy The second trimester is from week 13 through week 28, month 4 through 6. This is often the time in pregnancy that you feel your best. Often times, morning sickness has lessened or quit. You may have more energy, and you may get hungry more often. Your unborn baby (fetus) is growing rapidly. At the end of the sixth month, he or she is about 9 inches long and weighs about 1 pounds. You will likely feel the baby move (quickening) between 18 and 20 weeks of pregnancy. Follow these instructions at home:  Avoid all smoking, herbs, and alcohol. Avoid drugs not approved by your doctor.  Do not use any tobacco products, including cigarettes, chewing tobacco, and electronic cigarettes. If you need help quitting, ask your doctor. You may get counseling or other support to help you quit.  Only take medicine as told by your doctor. Some medicines are safe and some are not during pregnancy.  Exercise only as told by your doctor. Stop exercising if you start having cramps.  Eat regular, healthy meals.  Wear a good support bra if your breasts are tender.  Do not use hot tubs, steam rooms, or saunas.  Wear your seat belt when driving.  Avoid raw meat, uncooked cheese, and liter boxes and soil used by cats.  Take your prenatal vitamins.  Take 1500-2000 milligrams of calcium daily starting at the 20th week of pregnancy until you deliver your baby.  Try taking medicine that helps you poop (stool softener) as needed, and if your doctor approves. Eat more fiber by eating fresh fruit, vegetables, and whole grains. Drink enough fluids to keep your pee (urine) clear or pale yellow.  Take warm water baths (sitz baths) to soothe pain or discomfort caused by hemorrhoids. Use hemorrhoid cream if your doctor approves.  If you have puffy, bulging veins (varicose veins), wear support hose. Raise (elevate) your feet for 15 minutes, 3-4 times a day. Limit salt in your diet.  Avoid heavy  lifting, wear low heals, and sit up straight.  Rest with your legs raised if you have leg cramps or low back pain.  Visit your dentist if you have not gone during your pregnancy. Use a soft toothbrush to brush your teeth. Be gentle when you floss.  You can have sex (intercourse) unless your doctor tells you not to.  Go to your doctor visits. Get help if:  You feel dizzy.  You have mild cramps or pressure in your lower belly (abdomen).  You have a nagging pain in your belly area.  You continue to feel sick to your stomach (nauseous), throw up (vomit), or have watery poop (diarrhea).  You have bad smelling fluid coming from your vagina.  You have pain with peeing (urination). Get help right away if:  You have a fever.  You are leaking fluid from your vagina.  You have spotting or bleeding from your vagina.  You have severe belly cramping or pain.  You lose or gain weight rapidly.  You have trouble catching your breath and have chest pain.  You notice sudden or extreme puffiness (swelling) of your face, hands, ankles, feet, or legs.  You have not felt the baby move in over an hour.  You have severe headaches that do not go away with medicine.  You have vision changes. This information is not intended to replace advice given to you by your health care provider. Make sure you discuss any questions you have with your health care   provider. Document Released: 04/12/2009 Document Revised: 06/24/2015 Document Reviewed: 03/19/2012 Elsevier Interactive Patient Education  2017 Else[employer] Inc. Probiotics What are probiotics? Probiotics are the good bacteria and yeasts that live in your body and keep you and your digestive system healthy. Probiotics also help your body's defense (immune) system and protect your body against bad bacterial growth. Certain foods contain probiotics, such as yogurt. Probiotics can also be purchased as a supplement. As with any supplement or drug, it is  important to discuss its use with your health care provider. What affects the balance of bacteria in my body? The balance of bacteria in your body can be affected by:  Antibiotic medicines. Antibiotics are sometimes necessary to treat infection. Unfortunately, they may kill good or friendly bacteria in your body as well as the bad bacteria. This may lead to stomach problems like diarrhea, gas, and cramping.  Disease. Some conditions are the result of an overgrowth of bad bacteria, yeasts, parasites, or fungi. These conditions include: ? Infectious diarrhea. ? Stomach and respiratory infections. ? Skin infections. ? Irritable bowel syndrome (IBS). ? Inflammatory bowel diseases. ? Ulcer due to Helicobacter pylori (H. pylori) infection. ? Tooth decay and periodontal disease. ? Vaginal infections.  Stress and poor diet may also lower the good bacteria in your body. What type of probiotic is right for me? Probiotics are available over the counter at your local pharmacy, health food, or grocery store. They come in many different forms, combinations of strains, and dosing strengths. Some may need to be refrigerated. Always read the label for storage and usage instructions. Specific strains have been shown to be more effective for certain conditions. Ask your health care provider what option is best for you. Why would I need probiotics? There are many reasons your health care provider might recommend a probiotic supplement, including:  Diarrhea.  Constipation.  IBS.  Respiratory infections.  Yeast infections.  Acne, eczema, and other skin conditions.  Frequent urinary tract infections (UTIs).  Are there side effects of probiotics? Some people experience mild side effects when taking probiotics. Side effects are usually temporary and may include:  Gas.  Bloating.  Cramping.  Rarely, serious side effects, such as infection or immune system changes, may occur. What else do I need  to know about probiotics?  There are many different strains of probiotics. Certain strains may be more effective depending on your condition. Probiotics are available in varying doses. Ask your health care provider which probiotic you should use and how often.  If you are taking probiotics along with antibiotics, it is generally recommended to wait at least 2 hours between taking the antibiotic and taking the probiotic. For more information: [facility] http://potts.com/ This information is not intended to replace advice given to you by your health care provider. Make sure you discuss any questions you have with your health care provider. Document Released: 08/13/2013 Document Revised: 12/14/2015 Document Reviewed: 04/15/2013 Elsevier Interactive Patient Education  2017 [employer].  Third Trimester of Pregnancy The third trimester is from week 29 through week 42, months 7 through 9. This trimester is when your unborn baby (fetus) is growing very fast. At the end of the ninth month, the unborn baby is about 20 inches in length. It weighs about 6-10 pounds. Follow these instructions at home:  Avoid all smoking, herbs, and alcohol. Avoid drugs not approved by your doctor.  Do not use any tobacco products, including cigarettes, chewing tobacco, and electronic cigarettes. If  you need help quitting, ask your doctor. You may get counseling or other support to help you quit.  Only take medicine as told by your doctor. Some medicines are safe and some are not during pregnancy.  Exercise only as told by your doctor. Stop exercising if you start having cramps.  Eat regular, healthy meals.  Wear a good support bra if your breasts are tender.  Do not use hot tubs, steam rooms, or saunas.  Wear your seat belt when driving.  Avoid raw meat, uncooked cheese, and liter boxes and soil used by cats.  Take your prenatal vitamins.  Take 1500-2000  milligrams of calcium daily starting at the 20th week of pregnancy until you deliver your baby.  Try taking medicine that helps you poop (stool softener) as needed, and if your doctor approves. Eat more fiber by eating fresh fruit, vegetables, and whole grains. Drink enough fluids to keep your pee (urine) clear or pale yellow.  Take warm water baths (sitz baths) to soothe pain or discomfort caused by hemorrhoids. Use hemorrhoid cream if your doctor approves.  If you have puffy, bulging veins (varicose veins), wear support hose. Raise (elevate) your feet for 15 minutes, 3-4 times a day. Limit salt in your diet.  Avoid heavy lifting, wear low heels, and sit up straight.  Rest with your legs raised if you have leg cramps or low back pain.  Visit your dentist if you have not gone during your pregnancy. Use a soft toothbrush to brush your teeth. Be gentle when you floss.  You can have sex (intercourse) unless your doctor tells you not to.  Do not travel far distances unless you must. Only do so with your doctor's approval.  Take prenatal classes.  Practice driving to the hospital.  Pack your hospital bag.  Prepare the baby's room.  Go to your doctor visits. Get help if:  You are not sure if you are in labor or if your water has broken.  You are dizzy.  You have mild cramps or pressure in your lower belly (abdominal).  You have a nagging pain in your belly area.  You continue to feel sick to your stomach (nauseous), throw up (vomit), or have watery poop (diarrhea).  You have bad smelling fluid coming from your vagina.  You have pain with peeing (urination). Get help right away if:  You have a fever.  You are leaking fluid from your vagina.  You are spotting or bleeding from your vagina.  You have severe belly cramping or pain.  You lose or gain weight rapidly.  You have trouble catching your breath and have chest pain.  You notice sudden or extreme puffiness  (swelling) of your face, hands, ankles, feet, or legs.  You have not felt the baby move in over an hour.  You have severe headaches that do not go away with medicine.  You have vision changes. This information is not intended to replace advice given to you by your health care provider. Make sure you discuss any questions you have with your health care provider. Document Released: 04/12/2009 Document Revised: 06/24/2015 Document Reviewed: 03/19/2012 Elsevier Interactive Patient Education  2017 Elsevier Inc.  Vaginal Yeast infection, Adult Vaginal yeast infection is a condition that causes soreness, swelling, and redness (inflammation) of the vagina. It also causes vaginal discharge. This is a common condition. Some women get this infection frequently. What are the causes? This condition is caused by a change in the normal balance of the yeast (  candida) and bacteria that live in the vagina. This change causes an overgrowth of yeast, which causes the inflammation. What increases the risk? This condition is more likely to develop in:  Women who take antibiotic medicines.  Women who have diabetes.  Women who take birth control pills.  Women who are pregnant.  Women who douche often.  Women who have a weak defense (immune) system.  Women who have been taking steroid medicines for a long time.  Women who frequently wear tight clothing.  What are the signs or symptoms? Symptoms of this condition include:  White, thick vaginal discharge.  Swelling, itching, redness, and irritation of the vagina. The lips of the vagina (vulva) may be affected as well.  Pain or a burning feeling while urinating.  Pain during sex.  How is this diagnosed? This condition is diagnosed with a medical history and physical exam. This will include a pelvic exam. Your health care provider will examine a sample of your vaginal discharge under a microscope. Your health care provider may send this sample for  testing to confirm the diagnosis. How is this treated? This condition is treated with medicine. Medicines may be over-the-counter or prescription. You may be told to use one or more of the following:  Medicine that is taken orally.  Medicine that is applied as a cream.  Medicine that is inserted directly into the vagina (suppository).  Follow these instructions at home:  Take or apply over-the-counter and prescription medicines only as told by your health care provider.  Do not have sex until your health care provider has approved. Tell your sex partner that you have a yeast infection. That person should go to his or her health care provider if he or she develops symptoms.  Do not wear tight clothes, such as pantyhose or tight pants.  Avoid using tampons until your health care provider approves.  Eat more yogurt. This may help to keep your yeast infection from returning.  Try taking a sitz bath to help with discomfort. This is a warm water bath that is taken while you are sitting down. The water should only come up to your hips and should cover your buttocks. Do this 3-4 times per day or as told by your health care provider.  Do not douche.  Wear breathable, cotton underwear.  If you have diabetes, keep your blood sugar levels under control. Contact a health care provider if:  You have a fever.  Your symptoms go away and then return.  Your symptoms do not get better with treatment.  Your symptoms get worse.  You have new symptoms.  You develop blisters in or around your vagina.  You have blood coming from your vagina and it is not your menstrual period.  You develop pain in your abdomen. This information is not intended to replace advice given to you by your health care provider. Make sure you discuss any questions you have with your health care provider. Document Released: 10/26/2004 Document Revised: 06/30/2015 Document Reviewed: 07/20/2014 Elsevier Interactive  Patient Education  2018 Elsevier Inc. Glucose Tolerance Test During Pregnancy The glucose tolerance test (GTT) is a blood test used to determine if you have developed a type of diabetes during pregnancy (gestational diabetes). This is when your body does not properly process sugar (glucose) in the food you eat, resulting in high blood glucose levels. Typically, a GTT is done after you have had a 1-hour glucose test with results that indicate you possibly have gestational diabetes. It  may also be done if:  You have a history of giving birth to very large babies or have experienced repeated fetal loss (stillbirth).  You have signs and symptoms of diabetes, such as: ? Changes in your vision. ? Tingling or numbness in your hands or feet. ? Changes in hunger, thirst, and urination not otherwise explained by your pregnancy.  The GTT lasts about 3 hours. You will be given a sugar-water solution to drink at the beginning of the test. You will have blood drawn before you drink the solution and then again 1, 2, and 3 hours after you drink it. You will not be allowed to eat or drink anything else during the test. You must remain at the testing location to make sure that your blood is drawn on time. You should also avoid exercising during the test, because exercise can alter test results. How do I prepare for this test? Eat normally for 3 days prior to the GTT test, including having plenty of carbohydrate-rich foods. Do not eat or drink anything except water during the final 12 hours before the test. In addition, your health care provider may ask you to stop taking certain medicines before the test. What do the results mean? It is your responsibility to obtain your test results. Ask the lab or department performing the test when and how you will get your results. Contact your health care provider to discuss any questions you have about your results. Range of Normal Values Ranges for normal values may vary  among different labs and hospitals. You should always check with your health care provider after having lab work or other tests done to discuss whether your values are considered within normal limits. Normal levels of blood glucose are as follows:  Fasting: less than 105 mg/dL.  1 hour after drinking the solution: less than 190 mg/dL.  2 hours after drinking the solution: less than 165 mg/dL.  3 hours after drinking the solution: less than 145 mg/dL.  Some substances can interfere with GTT results. These may include:  Blood pressure and heart failure medicines, including beta blockers, furosemide, and thiazides.  Anti-inflammatory medicines, including aspirin.  Nicotine.  Some psychiatric medicines.  Meaning of Results Outside Normal Value Ranges GTT test results that are above normal values may indicate a number of health problems, such as:  Gestational diabetes.  Acute stress response.  Cushing syndrome.  Tumors such as pheochromocytoma or glucagonoma.  Long-term kidney problems.  Pancreatitis.  Hyperthyroidism.  Current infection.  Discuss your test results with your health care provider. He or she will use the results to make a diagnosis and determine a treatment plan that is right for you. This information is not intended to replace advice given to you by your health care provider. Make sure you discuss any questions you have with your health care provider. Document Released: 07/18/2011 Document Revised: 06/24/2015 Document Reviewed: 05/23/2013 Elsevier Interactive Patient Education  [employer]2018 Elsevier Inc.

## 2017-02-07 NOTE — Progress Notes (Signed)
Patient reports fetal movement, denies pain.Patient complains of vaginal irritation.

## 2017-02-07 NOTE — Progress Notes (Signed)
   PRENATAL VISIT NOTE  Subjective:  Kristi [name] is a 32 y.o. G1P0 at 3655w6d being seen today for ongoing prenatal care.  She is currently monitored for the following issues for this low-risk pregnancy and has Supervision of normal first pregnancy, antepartum and Rh negative state in antepartum period on their problem list.  Patient reports no bleeding, no contractions, no cramping, no leaking, vaginal irritation and round ligament type pains, homeopathic remedies discussed.  Contractions: Not present. Vag. Bleeding: None.  Movement: Present. Denies leaking of fluid.   The following portions of the patient's history were reviewed and updated as appropriate: allergies, current medications, past family history, past medical history, past social history, past surgical history and problem list. Problem list updated.  Objective:   Vitals:   02/07/17 0945  BP: 97/67  Pulse: 82  Weight: 153 lb 6.4 oz (69.6 kg)    Fetal Status: Fetal Heart Rate (bpm): 155; doppler Fundal Height: 24 cm Movement: Present     General:  Alert, oriented and cooperative. Patient is in no acute distress.  Skin: Skin is warm and dry. No rash noted.   Cardiovascular: Normal heart rate noted  Respiratory: Normal respiratory effort, no problems with respiration noted  Abdomen: Soft, gravid, appropriate for gestational age.  Pain/Pressure: Absent     Pelvic: Cervical exam deferred        Extremities: Normal range of motion.  Edema: None  Mental Status:  Normal mood and affect. Normal behavior. Normal judgment and thought content.   Assessment and Plan:  Pregnancy: G1P0 at 2455w6d  1. Supervision of normal first pregnancy, antepartum     Doing well  2. Yeast vaginitis      - fluconazole (DIFLUCAN) 150 MG tablet; Take 1 tablet (150 mg total) by mouth once for 1 dose.  Dispense: 1 tablet; Refill: 0 - terconazole (TERAZOL 3) 0.8 % vaginal cream; Place 1 applicator vaginally at bedtime.  Dispense: 20 g; Refill:  0  Preterm labor symptoms and general obstetric precautions including but not limited to vaginal bleeding, contractions, leaking of fluid and fetal movement were reviewed in detail with the patient. Please refer to After Visit Summary for other counseling recommendations.  Return in about 4 weeks (around 03/07/2017) for ROB, 2 hr OGTT/Rhogam.   Roe Coombsachelle A Petra Dumler, CNM

## 2017-02-07 NOTE — Addendum Note (Signed)
Addended by: Natale MilchSTALLING, Grayton Lobo D on: 02/07/2017 10:31 AM   Modules accepted: Orders

## 2017-02-08 LAB — CERVICOVAGINAL ANCILLARY ONLY
BACTERIAL VAGINITIS: NEGATIVE
Candida vaginitis: POSITIVE — AB

## 2017-02-12 ENCOUNTER — Other Ambulatory Visit: Payer: Self-pay | Admitting: Certified Nurse Midwife

## 2017-02-19 ENCOUNTER — Ambulatory Visit (HOSPITAL_COMMUNITY)
Admission: RE | Admit: 2017-02-19 | Discharge: 2017-02-19 | Disposition: A | Discharge status: Home / Self Care | Payer: BLUE CROSS/BLUE SHIELD | Source: Ambulatory Visit | Attending: Certified Nurse Midwife | Admitting: Certified Nurse Midwife

## 2017-02-19 ENCOUNTER — Other Ambulatory Visit: Payer: Self-pay | Admitting: Certified Nurse Midwife

## 2017-02-19 ENCOUNTER — Ambulatory Visit (HOSPITAL_COMMUNITY): Payer: BLUE CROSS/BLUE SHIELD

## 2017-02-19 DIAGNOSIS — O359XX0 Maternal care for (suspected) fetal abnormality and damage, unspecified, not applicable or unspecified: Secondary | ICD-10-CM

## 2017-02-19 DIAGNOSIS — O358XX0 Maternal care for other (suspected) fetal abnormality and damage, not applicable or unspecified: Secondary | ICD-10-CM | POA: Diagnosis not present

## 2017-02-19 DIAGNOSIS — Z3400 Encounter for supervision of normal first pregnancy, unspecified trimester: Secondary | ICD-10-CM

## 2017-02-19 DIAGNOSIS — O402XX0 Polyhydramnios, second trimester, not applicable or unspecified: Secondary | ICD-10-CM | POA: Diagnosis not present

## 2017-02-19 DIAGNOSIS — Z3A25 25 weeks gestation of pregnancy: Secondary | ICD-10-CM

## 2017-02-19 DIAGNOSIS — Z362 Encounter for other antenatal screening follow-up: Secondary | ICD-10-CM | POA: Insufficient documentation

## 2017-02-21 ENCOUNTER — Other Ambulatory Visit: Payer: Self-pay | Admitting: Certified Nurse Midwife

## 2017-02-21 DIAGNOSIS — Z3400 Encounter for supervision of normal first pregnancy, unspecified trimester: Secondary | ICD-10-CM

## 2017-02-27 ENCOUNTER — Telehealth: Payer: Self-pay | Admitting: Pediatrics

## 2017-02-27 NOTE — Telephone Encounter (Signed)
Pt called with questions about US results. And sch f/u scan.  She requested to see provider at 2/5 2 hr gtt appt.  I made appt w/ Dr Clearance CootsHarper and assured her the f/u scan would be ordered a that time. She voiced understanding and gratitude.

## 2017-03-06 ENCOUNTER — Ambulatory Visit (INDEPENDENT_AMBULATORY_CARE_PROVIDER_SITE_OTHER): Payer: BLUE CROSS/BLUE SHIELD | Admitting: Obstetrics

## 2017-03-06 ENCOUNTER — Encounter: Payer: Self-pay | Admitting: Obstetrics

## 2017-03-06 ENCOUNTER — Other Ambulatory Visit: Payer: BLUE CROSS/BLUE SHIELD

## 2017-03-06 VITALS — BP 110/73 | HR 88 | Wt 160.4 lb

## 2017-03-06 DIAGNOSIS — Z3400 Encounter for supervision of normal first pregnancy, unspecified trimester: Secondary | ICD-10-CM | POA: Diagnosis not present

## 2017-03-06 DIAGNOSIS — Z6791 Unspecified blood type, Rh negative: Secondary | ICD-10-CM

## 2017-03-06 DIAGNOSIS — Z3402 Encounter for supervision of normal first pregnancy, second trimester: Secondary | ICD-10-CM

## 2017-03-06 DIAGNOSIS — O26899 Other specified pregnancy related conditions, unspecified trimester: Secondary | ICD-10-CM

## 2017-03-06 MED ORDER — RHO D IMMUNE GLOBULIN 1500 UNIT/2ML IJ SOSY: 300.0000 ug | PREFILLED_SYRINGE | Freq: Once | INTRAMUSCULAR | 0 refills | Status: DC

## 2017-03-06 MED ORDER — RHO D IMMUNE GLOBULIN 1500 UNIT/2ML IJ SOSY
300.0000 ug | PREFILLED_SYRINGE | Freq: Once | INTRAMUSCULAR | Status: AC
  Administered 2017-03-06: 300 ug via INTRAMUSCULAR

## 2017-03-06 NOTE — Addendum Note (Signed)
Addended by: Natale MilchSTALLING, BRITTANY D on: 03/06/2017 11:36 AM   Modules accepted: Orders

## 2017-03-06 NOTE — Addendum Note (Signed)
Addended by: Natale MilchSTALLING, Atul Delucia D on: 03/06/2017 10:57 AM   Modules accepted: Orders

## 2017-03-06 NOTE — Progress Notes (Signed)
Subjective:  Kristi [name] is a 32 y.o. G1P0 at 519w5d being seen today for ongoing prenatal care.  She is currently monitored for the following issues for this low-risk pregnancy and has Supervision of normal first pregnancy, antepartum and Rh negative state in antepartum period on their problem list.  Patient reports no complaints.  Contractions: Not present. Vag. Bleeding: None.  Movement: Present. Denies leaking of fluid.   The following portions of the patient's history were reviewed and updated as appropriate: allergies, current medications, past family history, past medical history, past social history, past surgical history and problem list. Problem list updated.  Objective:   Vitals:   03/06/17 0903  BP: 110/73  Pulse: 88  Weight: 160 lb 6.4 oz (72.8 kg)    Fetal Status: Fetal Heart Rate (bpm): 150   Movement: Present     General:  Alert, oriented and cooperative. Patient is in no acute distress.  Skin: Skin is warm and dry. No rash noted.   Cardiovascular: Normal heart rate noted  Respiratory: Normal respiratory effort, no problems with respiration noted  Abdomen: Soft, gravid, appropriate for gestational age. Pain/Pressure: Present     Pelvic:  Cervical exam deferred        Extremities: Normal range of motion.  Edema: None  Mental Status: Normal mood and affect. Normal behavior. Normal judgment and thought content.   Urinalysis:      Assessment and Plan:  Pregnancy: G1P0 at 509w5d  1. Supervision of normal first pregnancy, antepartum Rx: - Glucose Tolerance, 2 Hours w/1 Hour - CBC - RPR - HIV antibody  2. Rh negative state in antepartum period - Rhogam at 28 weeks and postpartum  Preterm labor symptoms and general obstetric precautions including but not limited to vaginal bleeding, contractions, leaking of fluid and fetal movement were reviewed in detail with the patient. Please refer to After Visit Summary for other counseling recommendations.  Return in about 2  weeks (around 03/20/2017) for ROB.   Brock BadHarper, Charles A, MD

## 2017-03-07 LAB — CBC
HEMATOCRIT: 32.7 % — AB (ref 34.0–46.6)
Hemoglobin: 10.7 g/dL — ABNORMAL LOW (ref 11.1–15.9)
MCH: 31.4 pg (ref 26.6–33.0)
MCHC: 32.7 g/dL (ref 31.5–35.7)
MCV: 96 fL (ref 79–97)
PLATELETS: 187 10*3/uL (ref 150–379)
RBC: 3.41 x10E6/uL — AB (ref 3.77–5.28)
RDW: 14.3 % (ref 12.3–15.4)
WBC: 8.6 10*3/uL (ref 3.4–10.8)

## 2017-03-07 LAB — HIV ANTIBODY (ROUTINE TESTING W REFLEX): HIV Screen 4th Generation wRfx: NONREACTIVE

## 2017-03-07 LAB — RPR: RPR Ser Ql: NONREACTIVE

## 2017-03-07 LAB — GLUCOSE TOLERANCE, 2 HOURS W/ 1HR
GLUCOSE, 2 HOUR: 78 mg/dL (ref 65–152)
GLUCOSE, FASTING: 76 mg/dL (ref 65–91)
Glucose, 1 hour: 117 mg/dL (ref 65–179)

## 2017-03-20 ENCOUNTER — Encounter: Payer: Self-pay | Admitting: Obstetrics

## 2017-03-20 ENCOUNTER — Ambulatory Visit (INDEPENDENT_AMBULATORY_CARE_PROVIDER_SITE_OTHER): Payer: BLUE CROSS/BLUE SHIELD | Admitting: Obstetrics

## 2017-03-20 VITALS — BP 109/71 | HR 79 | Wt 166.5 lb

## 2017-03-20 DIAGNOSIS — Z6791 Unspecified blood type, Rh negative: Secondary | ICD-10-CM

## 2017-03-20 DIAGNOSIS — Z3403 Encounter for supervision of normal first pregnancy, third trimester: Secondary | ICD-10-CM

## 2017-03-20 DIAGNOSIS — Z3400 Encounter for supervision of normal first pregnancy, unspecified trimester: Secondary | ICD-10-CM

## 2017-03-20 DIAGNOSIS — O26899 Other specified pregnancy related conditions, unspecified trimester: Secondary | ICD-10-CM

## 2017-03-20 NOTE — Progress Notes (Signed)
Subjective:  Kristi [name] is a 32 y.o. G1P0 at 9340w5d being seen today for ongoing prenatal care.  She is currently monitored for the following issues for this low-risk pregnancy and has Supervision of normal first pregnancy, antepartum and Rh negative state in antepartum period on their problem list.  Patient reports no complaints.  Contractions: Not present. Vag. Bleeding: None.  Movement: Present. Denies leaking of fluid.   The following portions of the patient's history were reviewed and updated as appropriate: allergies, current medications, past family history, past medical history, past social history, past surgical history and problem list. Problem list updated.  Objective:   Vitals:   03/20/17 1009  BP: 109/71  Pulse: 79  Weight: 166 lb 8 oz (75.5 kg)    Fetal Status: Fetal Heart Rate (bpm): 150   Movement: Present     General:  Alert, oriented and cooperative. Patient is in no acute distress.  Skin: Skin is warm and dry. No rash noted.   Cardiovascular: Normal heart rate noted  Respiratory: Normal respiratory effort, no problems with respiration noted  Abdomen: Soft, gravid, appropriate for gestational age. Pain/Pressure: Absent     Pelvic:  Cervical exam deferred        Extremities: Normal range of motion.  Edema: Trace  Mental Status: Normal mood and affect. Normal behavior. Normal judgment and thought content.   Urinalysis:      Assessment and Plan:  Pregnancy: G1P0 at 540w5d  1. Supervision of normal first pregnancy, antepartum   2. Rh negative state in antepartum period - received Rhogam at 28 weeks - Rhogam to be given postpartum  Preterm labor symptoms and general obstetric precautions including but not limited to vaginal bleeding, contractions, leaking of fluid and fetal movement were reviewed in detail with the patient. Please refer to After Visit Summary for other counseling recommendations.  Return in about 2 weeks (around 04/03/2017) for ROB.   Brock BadHarper,  Charles A, MD

## 2017-03-20 NOTE — Progress Notes (Signed)
Patient reports good fetal movement, denies pain. 

## 2017-03-23 ENCOUNTER — Ambulatory Visit (HOSPITAL_COMMUNITY): Payer: BLUE CROSS/BLUE SHIELD

## 2017-03-27 ENCOUNTER — Ambulatory Visit (HOSPITAL_COMMUNITY)
Admission: RE | Admit: 2017-03-27 | Discharge: 2017-03-27 | Disposition: A | Discharge status: Home / Self Care | Payer: BLUE CROSS/BLUE SHIELD | Source: Ambulatory Visit | Attending: Certified Nurse Midwife | Admitting: Certified Nurse Midwife

## 2017-03-27 ENCOUNTER — Other Ambulatory Visit: Payer: Self-pay | Admitting: Certified Nurse Midwife

## 2017-03-27 DIAGNOSIS — Z3A30 30 weeks gestation of pregnancy: Secondary | ICD-10-CM

## 2017-03-27 DIAGNOSIS — O09893 Supervision of other high risk pregnancies, third trimester: Secondary | ICD-10-CM | POA: Diagnosis not present

## 2017-03-27 DIAGNOSIS — O403XX0 Polyhydramnios, third trimester, not applicable or unspecified: Secondary | ICD-10-CM | POA: Insufficient documentation

## 2017-03-27 DIAGNOSIS — Z3689 Encounter for other specified antenatal screening: Secondary | ICD-10-CM | POA: Diagnosis not present

## 2017-03-27 DIAGNOSIS — Z3400 Encounter for supervision of normal first pregnancy, unspecified trimester: Secondary | ICD-10-CM

## 2017-03-29 ENCOUNTER — Other Ambulatory Visit: Payer: Self-pay | Admitting: Certified Nurse Midwife

## 2017-03-29 ENCOUNTER — Telehealth: Payer: Self-pay

## 2017-03-29 NOTE — Telephone Encounter (Signed)
Left VM message to call office.

## 2017-03-29 NOTE — Telephone Encounter (Signed)
-----   Message from Roe Coombsachelle A Denney, CNM sent at 03/29/2017 10:20 AM EST ----- Patient needs bi-weekly NSTs with AFI weekly.  Starting today.  Thank you. Rachelle

## 2017-03-30 ENCOUNTER — Encounter: Payer: Self-pay | Admitting: Certified Nurse Midwife

## 2017-03-30 ENCOUNTER — Ambulatory Visit (INDEPENDENT_AMBULATORY_CARE_PROVIDER_SITE_OTHER): Payer: BLUE CROSS/BLUE SHIELD | Admitting: Certified Nurse Midwife

## 2017-03-30 VITALS — BP 116/75 | HR 92 | Wt 165.0 lb

## 2017-03-30 DIAGNOSIS — O09893 Supervision of other high risk pregnancies, third trimester: Secondary | ICD-10-CM

## 2017-03-30 DIAGNOSIS — O26899 Other specified pregnancy related conditions, unspecified trimester: Secondary | ICD-10-CM

## 2017-03-30 DIAGNOSIS — O403XX0 Polyhydramnios, third trimester, not applicable or unspecified: Secondary | ICD-10-CM

## 2017-03-30 DIAGNOSIS — Z23 Encounter for immunization: Secondary | ICD-10-CM

## 2017-03-30 DIAGNOSIS — Z6791 Unspecified blood type, Rh negative: Secondary | ICD-10-CM

## 2017-03-30 DIAGNOSIS — Z3403 Encounter for supervision of normal first pregnancy, third trimester: Secondary | ICD-10-CM

## 2017-03-30 DIAGNOSIS — Z3400 Encounter for supervision of normal first pregnancy, unspecified trimester: Secondary | ICD-10-CM

## 2017-03-30 NOTE — Progress Notes (Signed)
   PRENATAL VISIT NOTE  Subjective:  Kristi [name] is a 32 y.o. G1P0 at 1643w1d being seen today for ongoing prenatal care.  She is currently monitored for the following issues for this low-risk pregnancy and has Supervision of normal first pregnancy, antepartum; Rh negative state in antepartum period; and Polyhydramnios affecting pregnancy in third trimester on their problem list.  Patient reports no complaints.  Contractions: Not present. Vag. Bleeding: None.  Movement: Present. Denies leaking of fluid.   The following portions of the patient's history were reviewed and updated as appropriate: allergies, current medications, past family history, past medical history, past social history, past surgical history and problem list. Problem list updated.  Objective:   Vitals:   03/30/17 0847  BP: 116/75  Pulse: 92  Weight: 165 lb (74.8 kg)    Fetal Status: Fetal Heart Rate (bpm): NST; reactive Fundal Height: 32 cm Movement: Present     General:  Alert, oriented and cooperative. Patient is in no acute distress.  Skin: Skin is warm and dry. No rash noted.   Cardiovascular: Normal heart rate noted  Respiratory: Normal respiratory effort, no problems with respiration noted  Abdomen: Soft, gravid, appropriate for gestational age.  Pain/Pressure: Present     Pelvic: Cervical exam deferred        Extremities: Normal range of motion.  Edema: Trace  Mental Status:  Normal mood and affect. Normal behavior. Normal judgment and thought content.  NST: + 10X10 accels, no decels, moderate variability, Cat. 1 tracing. No contractions on toco.   Assessment and Plan:  Pregnancy: G1P0 at 7343w1d  1. Supervision of normal first pregnancy, antepartum     Doing well    - Fetal nonstress test; Future  2. Rh negative state in antepartum period     Rhogam given 03/06/17  3. Polyhydramnios affecting pregnancy in third trimester     Reactive NST, no further NSTs recommended for mild polyhydramnios.  Discussed  POC with Dr. Alysia PennaErvin.  F/U US for growth and AFI in one month.   - US MFM OB FOLLOW UP; Future  Preterm labor symptoms and general obstetric precautions including but not limited to vaginal bleeding, contractions, leaking of fluid and fetal movement were reviewed in detail with the patient. Please refer to After Visit Summary for other counseling recommendations.  Return in about 2 weeks (around 04/13/2017) for ROB.   Roe Coombsachelle A Denney, CNM

## 2017-04-02 ENCOUNTER — Encounter: Payer: BLUE CROSS/BLUE SHIELD | Admitting: Certified Nurse Midwife

## 2017-04-02 NOTE — Addendum Note (Signed)
Addended by: Hamilton CapriBURCH, ARIEL J on: 04/02/2017 08:05 AM   Modules accepted: Orders

## 2017-04-09 ENCOUNTER — Encounter: Payer: Self-pay | Admitting: Certified Nurse Midwife

## 2017-04-09 ENCOUNTER — Ambulatory Visit (INDEPENDENT_AMBULATORY_CARE_PROVIDER_SITE_OTHER): Payer: BLUE CROSS/BLUE SHIELD | Admitting: Certified Nurse Midwife

## 2017-04-09 VITALS — BP 108/72 | HR 58 | Wt 168.8 lb

## 2017-04-09 DIAGNOSIS — O4693 Antepartum hemorrhage, unspecified, third trimester: Secondary | ICD-10-CM | POA: Diagnosis not present

## 2017-04-09 DIAGNOSIS — Z3400 Encounter for supervision of normal first pregnancy, unspecified trimester: Secondary | ICD-10-CM

## 2017-04-09 DIAGNOSIS — O09899 Supervision of other high risk pregnancies, unspecified trimester: Secondary | ICD-10-CM

## 2017-04-09 DIAGNOSIS — Z6791 Unspecified blood type, Rh negative: Secondary | ICD-10-CM

## 2017-04-09 DIAGNOSIS — O403XX0 Polyhydramnios, third trimester, not applicable or unspecified: Secondary | ICD-10-CM

## 2017-04-09 DIAGNOSIS — Z113 Encounter for screening for infections with a predominantly sexual mode of transmission: Secondary | ICD-10-CM | POA: Diagnosis not present

## 2017-04-09 DIAGNOSIS — O26899 Other specified pregnancy related conditions, unspecified trimester: Secondary | ICD-10-CM

## 2017-04-09 NOTE — Addendum Note (Signed)
Addended by: Natale MilchSTALLING, BRITTANY D on: 04/09/2017 04:45 PM   Modules accepted: Orders

## 2017-04-09 NOTE — Progress Notes (Signed)
Patient reports good fetal movement, complains of some vaginal bleeding Sunday morning after intercourse. Pt stated that she noticed light blood 3 times when she went to urinate, bleeding has since stopped.

## 2017-04-09 NOTE — Progress Notes (Signed)
   PRENATAL VISIT NOTE  Subjective:  Kristi [name] is a 32 y.o. G1P0 at 4146w4d being seen today for ongoing prenatal care.  She is currently monitored for the following issues for this low-risk pregnancy and has Supervision of normal first pregnancy, antepartum; Rh negative state in antepartum period; and Polyhydramnios affecting pregnancy in third trimester on their problem list.  Patient reports no leaking, occasional contractions and reports vaginal bleeding after intercourse yesturday, denies any strong contractions.  Contractions: Not present. Vag. Bleeding: None.  Movement: Present. Denies leaking of fluid.   The following portions of the patient's history were reviewed and updated as appropriate: allergies, current medications, past family history, past medical history, past social history, past surgical history and problem list. Problem list updated.  Objective:   Vitals:   04/09/17 1543  BP: 108/72  Pulse: (!) 58  Weight: 168 lb 12.8 oz (76.6 kg)    Fetal Status: Fetal Heart Rate (bpm): 158; doppler Fundal Height: 31 cm Movement: Present     General:  Alert, oriented and cooperative. Patient is in no acute distress.  Skin: Skin is warm and dry. No rash noted.   Cardiovascular: Normal heart rate noted  Respiratory: Normal respiratory effort, no problems with respiration noted  Abdomen: Soft, gravid, appropriate for gestational age.  Pain/Pressure: Present     Pelvic: Cervical exam performed Dilation: Closed Effacement (%): 0 Station: Ballotable, outer os 1 cm, soft, posterior  Extremities: Normal range of motion.  Edema: Trace  Mental Status:  Normal mood and affect. Normal behavior. Normal judgment and thought content.   Assessment and Plan:  Pregnancy: G1P0 at 3546w4d  1. Supervision of normal first pregnancy, antepartum       2. Rh negative state in antepartum period     Rhogam given 03/06/17  3. Polyhydramnios affecting pregnancy in third trimester     S=D today, f/u US  scheduled 04/30/17  4. Vaginal bleeding in pregnancy, third trimester    - Fetal fibronectin  Preterm labor symptoms and general obstetric precautions including but not limited to vaginal bleeding, contractions, leaking of fluid and fetal movement were reviewed in detail with the patient. Please refer to After Visit Summary for other counseling recommendations.  Return in about 1 week (around 04/16/2017) for ROB, GBS.   Roe Coombsachelle A Stela Iwasaki, CNM

## 2017-04-10 LAB — CERVICOVAGINAL ANCILLARY ONLY
BACTERIAL VAGINITIS: NEGATIVE
CANDIDA VAGINITIS: NEGATIVE
CHLAMYDIA, DNA PROBE: NEGATIVE
NEISSERIA GONORRHEA: NEGATIVE
Trichomonas: NEGATIVE

## 2017-04-10 LAB — FETAL FIBRONECTIN: Fetal Fibronectin: NEGATIVE

## 2017-04-16 ENCOUNTER — Encounter: Payer: Self-pay | Admitting: Certified Nurse Midwife

## 2017-04-16 ENCOUNTER — Ambulatory Visit (INDEPENDENT_AMBULATORY_CARE_PROVIDER_SITE_OTHER): Payer: BLUE CROSS/BLUE SHIELD | Admitting: Certified Nurse Midwife

## 2017-04-16 VITALS — BP 112/69 | HR 69 | Wt 171.2 lb

## 2017-04-16 DIAGNOSIS — Z6791 Unspecified blood type, Rh negative: Secondary | ICD-10-CM

## 2017-04-16 DIAGNOSIS — O403XX0 Polyhydramnios, third trimester, not applicable or unspecified: Secondary | ICD-10-CM

## 2017-04-16 DIAGNOSIS — Z3400 Encounter for supervision of normal first pregnancy, unspecified trimester: Secondary | ICD-10-CM

## 2017-04-16 DIAGNOSIS — O26899 Other specified pregnancy related conditions, unspecified trimester: Secondary | ICD-10-CM

## 2017-04-16 DIAGNOSIS — O09899 Supervision of other high risk pregnancies, unspecified trimester: Secondary | ICD-10-CM

## 2017-04-16 NOTE — Progress Notes (Signed)
   PRENATAL VISIT NOTE  Subjective:  Kristi [name] is a 32 y.o. G1P0 at 619w4d being seen today for ongoing prenatal care.  She is currently monitored for the following issues for this low-risk pregnancy and has Supervision of normal first pregnancy, antepartum; Rh negative state in antepartum period; and Polyhydramnios affecting pregnancy in third trimester on their problem list.  Patient reports no complaints.  Contractions: Not present. Vag. Bleeding: None.  Movement: Present. Denies leaking of fluid.   The following portions of the patient's history were reviewed and updated as appropriate: allergies, current medications, past family history, past medical history, past social history, past surgical history and problem list. Problem list updated.  Objective:   Vitals:   04/16/17 0907  BP: 112/69  Pulse: 69  Weight: 171 lb 3.2 oz (77.7 kg)    Fetal Status: Fetal Heart Rate (bpm): 150; doppler Fundal Height: 34 cm Movement: Present     General:  Alert, oriented and cooperative. Patient is in no acute distress.  Skin: Skin is warm and dry. No rash noted.   Cardiovascular: Normal heart rate noted  Respiratory: Normal respiratory effort, no problems with respiration noted  Abdomen: Soft, gravid, appropriate for gestational age.  Pain/Pressure: Absent     Pelvic: Cervical exam deferred        Extremities: Normal range of motion.  Edema: Trace  Mental Status:  Normal mood and affect. Normal behavior. Normal judgment and thought content.   Assessment and Plan:  Pregnancy: G1P0 at 809w4d  1. Supervision of normal first pregnancy, antepartum     Doing well  2. Rh negative state in antepartum period     Rhogam given previously  3. Polyhydramnios affecting pregnancy in third trimester     Has f/u US scheduled for 05/02/17, negative fFN last week.   Preterm labor symptoms and general obstetric precautions including but not limited to vaginal bleeding, contractions, leaking of fluid and  fetal movement were reviewed in detail with the patient. Please refer to After Visit Summary for other counseling recommendations.  Return in about 2 weeks (around 04/30/2017) for ROB, GBS.   Roe Coombsachelle A Samariah Hokenson, CNM

## 2017-04-24 ENCOUNTER — Encounter (HOSPITAL_COMMUNITY): Payer: Self-pay

## 2017-04-24 ENCOUNTER — Other Ambulatory Visit: Payer: Self-pay | Admitting: Certified Nurse Midwife

## 2017-04-24 ENCOUNTER — Ambulatory Visit (HOSPITAL_COMMUNITY)
Admission: RE | Admit: 2017-04-24 | Discharge: 2017-04-24 | Disposition: A | Discharge status: Home / Self Care | Payer: BLUE CROSS/BLUE SHIELD | Source: Ambulatory Visit | Attending: Certified Nurse Midwife | Admitting: Certified Nurse Midwife

## 2017-04-24 DIAGNOSIS — O403XX0 Polyhydramnios, third trimester, not applicable or unspecified: Secondary | ICD-10-CM | POA: Diagnosis not present

## 2017-04-24 DIAGNOSIS — Z362 Encounter for other antenatal screening follow-up: Secondary | ICD-10-CM | POA: Insufficient documentation

## 2017-04-24 DIAGNOSIS — Z3A34 34 weeks gestation of pregnancy: Secondary | ICD-10-CM | POA: Diagnosis not present

## 2017-04-30 ENCOUNTER — Ambulatory Visit (INDEPENDENT_AMBULATORY_CARE_PROVIDER_SITE_OTHER): Payer: BLUE CROSS/BLUE SHIELD | Admitting: Obstetrics

## 2017-04-30 ENCOUNTER — Encounter: Payer: Self-pay | Admitting: Obstetrics

## 2017-04-30 VITALS — BP 123/74 | HR 109 | Wt 177.2 lb

## 2017-04-30 DIAGNOSIS — O26893 Other specified pregnancy related conditions, third trimester: Secondary | ICD-10-CM

## 2017-04-30 DIAGNOSIS — O3663X0 Maternal care for excessive fetal growth, third trimester, not applicable or unspecified: Secondary | ICD-10-CM

## 2017-04-30 DIAGNOSIS — O26899 Other specified pregnancy related conditions, unspecified trimester: Secondary | ICD-10-CM

## 2017-04-30 DIAGNOSIS — Z3400 Encounter for supervision of normal first pregnancy, unspecified trimester: Secondary | ICD-10-CM

## 2017-04-30 DIAGNOSIS — O403XX0 Polyhydramnios, third trimester, not applicable or unspecified: Secondary | ICD-10-CM

## 2017-04-30 DIAGNOSIS — Z6791 Unspecified blood type, Rh negative: Secondary | ICD-10-CM

## 2017-04-30 DIAGNOSIS — Z3403 Encounter for supervision of normal first pregnancy, third trimester: Secondary | ICD-10-CM

## 2017-04-30 NOTE — Progress Notes (Signed)
Subjective:  Kristi [name] is a 32 y.o. G1P0 at 1918w4d being seen today for ongoing prenatal care.  She is currently monitored for the following issues for this high-risk pregnancy and has Supervision of normal first pregnancy, antepartum; Rh negative state in antepartum period; and Polyhydramnios affecting pregnancy in third trimester on their problem list.  Patient reports no complaints.  Contractions: Not present. Vag. Bleeding: None.  Movement: Present. Denies leaking of fluid.   The following portions of the patient's history were reviewed and updated as appropriate: allergies, current medications, past family history, past medical history, past social history, past surgical history and problem list. Problem list updated.  Objective:   Vitals:   04/30/17 1052  BP: 123/74  Pulse: (!) 109  Weight: 177 lb 3.2 oz (80.4 kg)    Fetal Status: Fetal Heart Rate (bpm): 150   Movement: Present     General:  Alert, oriented and cooperative. Patient is in no acute distress.  Skin: Skin is warm and dry. No rash noted.   Cardiovascular: Normal heart rate noted  Respiratory: Normal respiratory effort, no problems with respiration noted  Abdomen: Soft, gravid, appropriate for gestational age. Pain/Pressure: Absent     Pelvic:  Cervical exam deferred        Extremities: Normal range of motion.  Edema: Trace  Mental Status: Normal mood and affect. Normal behavior. Normal judgment and thought content.   Urinalysis:      Assessment and Plan:  Pregnancy: G1P0 at 7318w4d  1. Supervision of normal first pregnancy, antepartum   2. Rh negative state in antepartum period - Rhogam postpartum  3. Excessive fetal growth affecting management of pregnancy in third trimester, single or unspecified fetus - repeat ultrasound for growth at 38 weeks  4. Mild polyhydramnios - twice a  week fetal testing - consider delivery at 39 weeks  Preterm labor symptoms and general obstetric precautions including but  not limited to vaginal bleeding, contractions, leaking of fluid and fetal movement were reviewed in detail with the patient. Please refer to After Visit Summary for other counseling recommendations.  Return in about 1 week (around 05/07/2017) for ROB.   Brock BadHarper, Charles A, MD

## 2017-05-01 ENCOUNTER — Other Ambulatory Visit: Payer: Self-pay | Admitting: Certified Nurse Midwife

## 2017-05-01 ENCOUNTER — Ambulatory Visit (INDEPENDENT_AMBULATORY_CARE_PROVIDER_SITE_OTHER): Payer: BLUE CROSS/BLUE SHIELD

## 2017-05-01 ENCOUNTER — Encounter: Payer: Self-pay | Admitting: *Deleted

## 2017-05-01 DIAGNOSIS — O403XX0 Polyhydramnios, third trimester, not applicable or unspecified: Secondary | ICD-10-CM | POA: Diagnosis not present

## 2017-05-01 NOTE — Progress Notes (Signed)
Pt presents for NST today for polyhydramnios. Reactive per Dr.Constant. Pt has additional questions for provider.

## 2017-05-01 NOTE — Progress Notes (Signed)
Patient with mild polyhydramnios here for NST NST reviewed and reactive with baseline 140, mod variability, +accels, no decels

## 2017-05-02 ENCOUNTER — Ambulatory Visit (HOSPITAL_COMMUNITY): Payer: BLUE CROSS/BLUE SHIELD

## 2017-05-03 ENCOUNTER — Ambulatory Visit (HOSPITAL_COMMUNITY)
Admission: RE | Admit: 2017-05-03 | Discharge: 2017-05-03 | Disposition: A | Discharge status: Home / Self Care | Payer: BLUE CROSS/BLUE SHIELD | Source: Ambulatory Visit | Attending: Obstetrics | Admitting: Obstetrics

## 2017-05-03 ENCOUNTER — Other Ambulatory Visit: Payer: Self-pay | Admitting: Obstetrics

## 2017-05-03 ENCOUNTER — Other Ambulatory Visit: Payer: BLUE CROSS/BLUE SHIELD

## 2017-05-03 DIAGNOSIS — Z3A36 36 weeks gestation of pregnancy: Secondary | ICD-10-CM

## 2017-05-03 DIAGNOSIS — O3663X0 Maternal care for excessive fetal growth, third trimester, not applicable or unspecified: Secondary | ICD-10-CM

## 2017-05-03 DIAGNOSIS — O403XX0 Polyhydramnios, third trimester, not applicable or unspecified: Secondary | ICD-10-CM | POA: Diagnosis not present

## 2017-05-07 ENCOUNTER — Encounter: Payer: Self-pay | Admitting: Obstetrics

## 2017-05-07 ENCOUNTER — Ambulatory Visit (INDEPENDENT_AMBULATORY_CARE_PROVIDER_SITE_OTHER): Payer: BLUE CROSS/BLUE SHIELD | Admitting: Obstetrics

## 2017-05-07 ENCOUNTER — Ambulatory Visit: Payer: BLUE CROSS/BLUE SHIELD

## 2017-05-07 VITALS — BP 116/72 | HR 89 | Wt 182.3 lb

## 2017-05-07 DIAGNOSIS — Z113 Encounter for screening for infections with a predominantly sexual mode of transmission: Secondary | ICD-10-CM

## 2017-05-07 DIAGNOSIS — Z3400 Encounter for supervision of normal first pregnancy, unspecified trimester: Secondary | ICD-10-CM

## 2017-05-07 DIAGNOSIS — O26893 Other specified pregnancy related conditions, third trimester: Secondary | ICD-10-CM

## 2017-05-07 DIAGNOSIS — O26899 Other specified pregnancy related conditions, unspecified trimester: Secondary | ICD-10-CM

## 2017-05-07 DIAGNOSIS — O403XX0 Polyhydramnios, third trimester, not applicable or unspecified: Secondary | ICD-10-CM

## 2017-05-07 DIAGNOSIS — Z3483 Encounter for supervision of other normal pregnancy, third trimester: Secondary | ICD-10-CM

## 2017-05-07 DIAGNOSIS — Z3403 Encounter for supervision of normal first pregnancy, third trimester: Secondary | ICD-10-CM

## 2017-05-07 DIAGNOSIS — Z6791 Unspecified blood type, Rh negative: Secondary | ICD-10-CM

## 2017-05-07 LAB — OB RESULTS CONSOLE GC/CHLAMYDIA: GC PROBE AMP, GENITAL: NEGATIVE

## 2017-05-07 LAB — OB RESULTS CONSOLE GBS: STREP GROUP B AG: NEGATIVE

## 2017-05-07 NOTE — Addendum Note (Signed)
Addended by: Tim LairLARK, Jayr Lupercio on: 05/07/2017 04:57 PM   Modules accepted: Orders

## 2017-05-07 NOTE — Progress Notes (Addendum)
.   Subjective:  Kristi Kristi [name] is Kristi [name] 32 y.o. G1P0 at 3697w4d being seen today for ongoing prenatal care.  She is currently monitored for the following issues for this high-risk pregnancy and has Supervision of normal first pregnancy, antepartum; Rh negative state in antepartum period; and Polyhydramnios affecting pregnancy in third trimester on their problem list.  Patient reports no complaints.  Contractions: Irregular. Vag. Bleeding: None.  Movement: Present. Denies leaking of fluid.   The following portions of the patient's history were reviewed and updated as appropriate: allergies, current medications, past family history, past medical history, past social history, past surgical history and problem list. Problem list updated.  Objective:   Vitals:   05/07/17 1057  BP: 116/72  Pulse: 89  Weight: 182 lb 4.8 oz (82.7 kg)    Fetal Status: Fetal Heart Rate (bpm): 150   Movement: Present     General:  Alert, oriented and cooperative. Patient is in no acute distress.  Skin: Skin is warm and dry. No rash noted.   Cardiovascular: Normal heart rate noted  Respiratory: Normal respiratory effort, no problems with respiration noted  Abdomen: Soft, gravid, appropriate for gestational age. Pain/Pressure: Present     Pelvic:  Cervical exam deferred        Extremities: Normal range of motion.  Edema: Trace  Mental Status: Normal mood and affect. Normal behavior. Normal judgment and thought content.   Urinalysis:      Assessment and Plan:  Pregnancy: G1P0 at 2997w4d  1. Supervision of normal first pregnancy, antepartum Rx: - Culture, beta strep (group b only) - Fetal nonstress test  2. Polyhydramnios in third trimester complication, single or unspecified fetus Rx: - US MFM OB FOLLOW UP; Future  3. Rh negative state in antepartum period - Rhogam postpartum   Preterm labor symptoms and general obstetric precautions including but not limited to vaginal bleeding, contractions, leaking of fluid  and fetal movement were reviewed in detail with the patient. Please refer to After Visit Summary for other counseling recommendations.  Return in about 1 week (around 05/14/2017) for ROB.   Kristi Kristi [name], Kristi Shivley A, MD  05-07-2016   I reviewed the NST and agree with the nursing assessment.  EFM: Baseline: 150's bpm, Variability: Good {> 6 bpm), Accelerations: Reactive and Decelerations: Absent Toco: none  Kristi Kristi [name], Kristi Tallman A, MD 05/07/2017 4:37 PM

## 2017-05-08 ENCOUNTER — Encounter: Payer: BLUE CROSS/BLUE SHIELD | Admitting: Certified Nurse Midwife

## 2017-05-08 LAB — CERVICOVAGINAL ANCILLARY ONLY
Bacterial vaginitis: NEGATIVE
CANDIDA VAGINITIS: POSITIVE — AB
CHLAMYDIA, DNA PROBE: NEGATIVE
NEISSERIA GONORRHEA: NEGATIVE
TRICH (WINDOWPATH): NEGATIVE

## 2017-05-08 NOTE — Progress Notes (Signed)
errror

## 2017-05-09 ENCOUNTER — Other Ambulatory Visit: Payer: Self-pay | Admitting: Obstetrics

## 2017-05-09 ENCOUNTER — Telehealth: Payer: Self-pay

## 2017-05-09 DIAGNOSIS — B373 Candidiasis of vulva and vagina: Secondary | ICD-10-CM

## 2017-05-09 DIAGNOSIS — B3731 Acute candidiasis of vulva and vagina: Secondary | ICD-10-CM

## 2017-05-09 MED ORDER — FLUCONAZOLE 150 MG PO TABS: 150.0000 mg | ORAL_TABLET | Freq: Once | ORAL | 0 refills | Status: AC

## 2017-05-09 NOTE — Telephone Encounter (Signed)
-----   Message from Brock Badharles A Harper, MD sent at 05/09/2017  4:45 AM EDT ----- Diflucan Rx for yeast

## 2017-05-09 NOTE — Telephone Encounter (Signed)
Patient notified

## 2017-05-10 ENCOUNTER — Other Ambulatory Visit: Payer: Self-pay | Admitting: Obstetrics

## 2017-05-10 ENCOUNTER — Telehealth: Payer: Self-pay

## 2017-05-10 ENCOUNTER — Ambulatory Visit (HOSPITAL_COMMUNITY)
Admission: RE | Admit: 2017-05-10 | Discharge: 2017-05-10 | Disposition: A | Discharge status: Home / Self Care | Payer: BLUE CROSS/BLUE SHIELD | Source: Ambulatory Visit | Attending: Obstetrics | Admitting: Obstetrics

## 2017-05-10 DIAGNOSIS — Z3A37 37 weeks gestation of pregnancy: Secondary | ICD-10-CM | POA: Diagnosis not present

## 2017-05-10 DIAGNOSIS — Z362 Encounter for other antenatal screening follow-up: Secondary | ICD-10-CM

## 2017-05-10 DIAGNOSIS — O3663X0 Maternal care for excessive fetal growth, third trimester, not applicable or unspecified: Secondary | ICD-10-CM

## 2017-05-10 DIAGNOSIS — O403XX0 Polyhydramnios, third trimester, not applicable or unspecified: Secondary | ICD-10-CM

## 2017-05-10 NOTE — Telephone Encounter (Signed)
Patient notified, she verbalized understanding.

## 2017-05-10 NOTE — Telephone Encounter (Signed)
-----   Message from Brock Badharles A Harper, MD sent at 05/10/2017  1:39 PM EDT ----- Regarding: RE: refills Cannot refill Diflucan.  She is pregnant. ----- Message ----- From: Maretta BeesMcGlashan, Tyerra Loretto J, RMA Sent: 05/09/2017   9:51 AM To: Brock Badharles A Harper, MD Subject: refills                                        Patient is requesting 2 refills as back up.

## 2017-05-11 LAB — CULTURE, BETA STREP (GROUP B ONLY): STREP GP B CULTURE: NEGATIVE

## 2017-05-14 ENCOUNTER — Other Ambulatory Visit: Payer: BLUE CROSS/BLUE SHIELD

## 2017-05-14 ENCOUNTER — Ambulatory Visit (INDEPENDENT_AMBULATORY_CARE_PROVIDER_SITE_OTHER): Payer: BLUE CROSS/BLUE SHIELD | Admitting: Obstetrics

## 2017-05-14 ENCOUNTER — Encounter: Payer: Self-pay | Admitting: Obstetrics

## 2017-05-14 VITALS — BP 118/71 | HR 81 | Temp 98.4°F | Wt 180.2 lb

## 2017-05-14 DIAGNOSIS — O403XX0 Polyhydramnios, third trimester, not applicable or unspecified: Secondary | ICD-10-CM

## 2017-05-14 DIAGNOSIS — O0990 Supervision of high risk pregnancy, unspecified, unspecified trimester: Secondary | ICD-10-CM

## 2017-05-14 DIAGNOSIS — O3663X0 Maternal care for excessive fetal growth, third trimester, not applicable or unspecified: Secondary | ICD-10-CM

## 2017-05-14 DIAGNOSIS — O0993 Supervision of high risk pregnancy, unspecified, third trimester: Secondary | ICD-10-CM

## 2017-05-14 DIAGNOSIS — O26899 Other specified pregnancy related conditions, unspecified trimester: Secondary | ICD-10-CM

## 2017-05-14 DIAGNOSIS — Z6791 Unspecified blood type, Rh negative: Secondary | ICD-10-CM

## 2017-05-14 NOTE — Progress Notes (Signed)
Subjective:  Kristi [name] is a 32 y.o. G1P0 at 432w4d being seen today for ongoing prenatal care.  She is currently monitored for the following issues for this high-risk pregnancy and has Supervision of normal first pregnancy, antepartum; Rh negative state in antepartum period; and Polyhydramnios affecting pregnancy in third trimester on their problem list.  Patient reports no complaints.  Contractions: Irregular. Vag. Bleeding: None.  Movement: Present. Denies leaking of fluid.   The following portions of the patient's history were reviewed and updated as appropriate: allergies, current medications, past family history, past medical history, past social history, past surgical history and problem list. Problem list updated.  Objective:   Vitals:   05/14/17 1110  BP: 118/71  Pulse: 81  Temp: 98.4 F (36.9 C)  Weight: 180 lb 3.2 oz (81.7 kg)    Fetal Status: Fetal Heart Rate (bpm): NST-R   Movement: Present     General:  Alert, oriented and cooperative. Patient is in no acute distress.  Skin: Skin is warm and dry. No rash noted.   Cardiovascular: Normal heart rate noted  Respiratory: Normal respiratory effort, no problems with respiration noted  Abdomen: Soft, gravid, appropriate for gestational age. Pain/Pressure: Present     Pelvic:  Cervical exam deferred        Extremities: Normal range of motion.  Edema: Trace  Mental Status: Normal mood and affect. Normal behavior. Normal judgment and thought content.   Urinalysis:      Assessment and Plan:  Pregnancy: G1P0 at 1002w4d  1. Supervision of high risk pregnancy, antepartum  2. Polyhydramnios in third trimester complication, single or unspecified fetus - weekly antenatal fetal BPP / NST - delivery at 39 weeks  3. Excessive fetal growth affecting management of pregnancy in third trimester, single or unspecified fetus - serial ultrasounds monthly for interval growth  4. Rh negative state in antepartum period - Rhogam  postpartum  5. NST: I reviewed the NST and agree with the nursing assessment.  EFM: Baseline: 150's bpm, Variability: Good {> 6 bpm), Accelerations: Reactive and Decelerations: Absent Toco: none  Brock BadHarper, Charles A, MD 05/14/2017 12:16 PM  Term labor symptoms and general obstetric precautions including but not limited to vaginal bleeding, contractions, leaking of fluid and fetal movement were reviewed in detail with the patient. Please refer to After Visit Summary for other counseling recommendations.  Return in about 1 week (around 05/21/2017) for ROB.  NST.   Brock BadHarper, Charles A, MD  05-14-2017

## 2017-05-15 ENCOUNTER — Telehealth (HOSPITAL_COMMUNITY): Payer: Self-pay | Admitting: *Deleted

## 2017-05-15 ENCOUNTER — Encounter (HOSPITAL_COMMUNITY): Payer: Self-pay | Admitting: *Deleted

## 2017-05-15 NOTE — Telephone Encounter (Signed)
Preadmission screen  

## 2017-05-17 ENCOUNTER — Other Ambulatory Visit: Payer: Self-pay | Admitting: Obstetrics

## 2017-05-17 ENCOUNTER — Ambulatory Visit (HOSPITAL_COMMUNITY)
Admission: RE | Admit: 2017-05-17 | Discharge: 2017-05-17 | Disposition: A | Discharge status: Home / Self Care | Payer: BLUE CROSS/BLUE SHIELD | Source: Ambulatory Visit | Attending: Obstetrics | Admitting: Obstetrics

## 2017-05-17 DIAGNOSIS — O09893 Supervision of other high risk pregnancies, third trimester: Secondary | ICD-10-CM | POA: Diagnosis not present

## 2017-05-17 DIAGNOSIS — Z362 Encounter for other antenatal screening follow-up: Secondary | ICD-10-CM | POA: Insufficient documentation

## 2017-05-17 DIAGNOSIS — Z3A38 38 weeks gestation of pregnancy: Secondary | ICD-10-CM | POA: Insufficient documentation

## 2017-05-17 DIAGNOSIS — O403XX0 Polyhydramnios, third trimester, not applicable or unspecified: Secondary | ICD-10-CM | POA: Insufficient documentation

## 2017-05-17 DIAGNOSIS — O3663X0 Maternal care for excessive fetal growth, third trimester, not applicable or unspecified: Secondary | ICD-10-CM

## 2017-05-21 ENCOUNTER — Other Ambulatory Visit: Payer: BLUE CROSS/BLUE SHIELD

## 2017-05-21 ENCOUNTER — Ambulatory Visit (INDEPENDENT_AMBULATORY_CARE_PROVIDER_SITE_OTHER): Payer: BLUE CROSS/BLUE SHIELD | Admitting: Obstetrics

## 2017-05-21 ENCOUNTER — Encounter: Payer: BLUE CROSS/BLUE SHIELD | Admitting: Obstetrics

## 2017-05-21 VITALS — BP 111/77 | HR 92 | Wt 185.3 lb

## 2017-05-21 DIAGNOSIS — O0993 Supervision of high risk pregnancy, unspecified, third trimester: Secondary | ICD-10-CM

## 2017-05-21 DIAGNOSIS — O0990 Supervision of high risk pregnancy, unspecified, unspecified trimester: Secondary | ICD-10-CM

## 2017-05-21 DIAGNOSIS — N898 Other specified noninflammatory disorders of vagina: Secondary | ICD-10-CM

## 2017-05-21 DIAGNOSIS — O403XX0 Polyhydramnios, third trimester, not applicable or unspecified: Secondary | ICD-10-CM | POA: Diagnosis not present

## 2017-05-21 DIAGNOSIS — O3663X0 Maternal care for excessive fetal growth, third trimester, not applicable or unspecified: Secondary | ICD-10-CM

## 2017-05-21 MED ORDER — METRONIDAZOLE 500 MG PO TABS: 500.0000 mg | ORAL_TABLET | Freq: Two times a day (BID) | ORAL | 2 refills | Status: DC

## 2017-05-21 NOTE — Progress Notes (Signed)
Subjective:  Kristi [name] is a 32 y.o. G1P0 at 7644w4d being seen today for ongoing prenatal care.  She is currently monitored for the following issues for this high-risk pregnancy and has Supervision of normal first pregnancy, antepartum; Rh negative state in antepartum period; and Polyhydramnios affecting pregnancy in third trimester on their problem list.  Patient reports occasional contractions.  Contractions: Irregular. Vag. Bleeding: None.  Movement: Present. Denies leaking of fluid.   The following portions of the patient's history were reviewed and updated as appropriate: allergies, current medications, past family history, past medical history, past social history, past surgical history and problem list. Problem list updated.  Objective:   Vitals:   05/21/17 1049  BP: 111/77  Pulse: 92  Weight: 185 lb 4.8 oz (84.1 kg)    Fetal Status: Fetal Heart Rate (bpm): NST   Movement: Present     General:  Alert, oriented and cooperative. Patient is in no acute distress.  Skin: Skin is warm and dry. No rash noted.   Cardiovascular: Normal heart rate noted  Respiratory: Normal respiratory effort, no problems with respiration noted  Abdomen: Soft, gravid, appropriate for gestational age. Pain/Pressure: Present     Pelvic:  Cervical exam deferred        Extremities: Normal range of motion.  Edema: Trace  Mental Status: Normal mood and affect. Normal behavior. Normal judgment and thought content.   Urinalysis:      Assessment and Plan:  Pregnancy: G1P0 at 7144w4d  1. Supervision of high risk pregnancy, antepartum   2. Polyhydramnios in third trimester complication, single or unspecified fetus - IOL at 39 weeks  3. Excessive fetal growth affecting management of pregnancy in third trimester, single or unspecified fetus  4. Vaginal discharge Rx: - metroNIDAZOLE (FLAGYL) 500 MG tablet; Take 1 tablet (500 mg total) by mouth 2 (two) times daily.  Dispense: 14 tablet; Refill: 2  5.  Vaginal odor   Term labor symptoms and general obstetric precautions including but not limited to vaginal bleeding, contractions, leaking of fluid and fetal movement were reviewed in detail with the patient. Please refer to After Visit Summary for other counseling recommendations.  Return in about 1 month (around 06/18/2017) for Postpartum.   Brock BadHarper, Charles A, MD

## 2017-05-21 NOTE — Progress Notes (Signed)
Pt c/o  Vaginal odor and discharge wants eval after nst.

## 2017-05-24 ENCOUNTER — Ambulatory Visit (HOSPITAL_COMMUNITY): Payer: BLUE CROSS/BLUE SHIELD

## 2017-05-24 ENCOUNTER — Other Ambulatory Visit: Payer: Self-pay

## 2017-05-24 ENCOUNTER — Inpatient Hospital Stay (HOSPITAL_COMMUNITY)
Admission: RE | Admit: 2017-05-24 | Discharge: 2017-05-27 | DRG: 805 | Disposition: A | Discharge status: Home / Self Care | Payer: BLUE CROSS/BLUE SHIELD | Source: Ambulatory Visit | Attending: Obstetrics and Gynecology | Admitting: Obstetrics and Gynecology

## 2017-05-24 ENCOUNTER — Encounter (HOSPITAL_COMMUNITY): Payer: Self-pay

## 2017-05-24 DIAGNOSIS — O3663X0 Maternal care for excessive fetal growth, third trimester, not applicable or unspecified: Secondary | ICD-10-CM | POA: Diagnosis present

## 2017-05-24 DIAGNOSIS — O403XX0 Polyhydramnios, third trimester, not applicable or unspecified: Principal | ICD-10-CM | POA: Diagnosis present

## 2017-05-24 DIAGNOSIS — Z6791 Unspecified blood type, Rh negative: Secondary | ICD-10-CM | POA: Diagnosis not present

## 2017-05-24 DIAGNOSIS — O411230 Chorioamnionitis, third trimester, not applicable or unspecified: Secondary | ICD-10-CM | POA: Diagnosis not present

## 2017-05-24 DIAGNOSIS — O26893 Other specified pregnancy related conditions, third trimester: Secondary | ICD-10-CM | POA: Diagnosis present

## 2017-05-24 DIAGNOSIS — Z2882 Immunization not carried out because of caregiver refusal: Secondary | ICD-10-CM | POA: Diagnosis not present

## 2017-05-24 DIAGNOSIS — O409XX0 Polyhydramnios, unspecified trimester, not applicable or unspecified: Secondary | ICD-10-CM | POA: Diagnosis present

## 2017-05-24 DIAGNOSIS — O411290 Chorioamnionitis, unspecified trimester, not applicable or unspecified: Secondary | ICD-10-CM

## 2017-05-24 DIAGNOSIS — O26899 Other specified pregnancy related conditions, unspecified trimester: Secondary | ICD-10-CM

## 2017-05-24 DIAGNOSIS — Z3A39 39 weeks gestation of pregnancy: Secondary | ICD-10-CM

## 2017-05-24 LAB — CBC
HCT: 35.4 % — ABNORMAL LOW (ref 36.0–46.0)
Hemoglobin: 11.9 g/dL — ABNORMAL LOW (ref 12.0–15.0)
MCH: 31.5 pg (ref 26.0–34.0)
MCHC: 33.6 g/dL (ref 30.0–36.0)
MCV: 93.7 fL (ref 78.0–100.0)
PLATELETS: 136 10*3/uL — AB (ref 150–400)
RBC: 3.78 MIL/uL — AB (ref 3.87–5.11)
RDW: 14.6 % (ref 11.5–15.5)
WBC: 7 10*3/uL (ref 4.0–10.5)

## 2017-05-24 LAB — RPR: RPR: NONREACTIVE

## 2017-05-24 MED ORDER — ONDANSETRON HCL 4 MG/2ML IJ SOLN
4.0000 mg | Freq: Four times a day (QID) | INTRAMUSCULAR | Status: DC | PRN
  Administered 2017-05-24 – 2017-05-25 (×3): 4 mg via INTRAVENOUS
  Filled 2017-05-24 (×3): qty 2

## 2017-05-24 MED ORDER — OXYCODONE-ACETAMINOPHEN 5-325 MG PO TABS: 1.0000 | ORAL_TABLET | ORAL | Status: DC | PRN

## 2017-05-24 MED ORDER — ACETAMINOPHEN 325 MG PO TABS
650.0000 mg | ORAL_TABLET | ORAL | Status: DC | PRN
  Administered 2017-05-25: 650 mg via ORAL
  Filled 2017-05-24: qty 2

## 2017-05-24 MED ORDER — FENTANYL CITRATE (PF) 100 MCG/2ML IJ SOLN
50.0000 ug | INTRAMUSCULAR | Status: DC | PRN
  Administered 2017-05-24 – 2017-05-25 (×3): 100 ug via INTRAVENOUS
  Filled 2017-05-24 (×3): qty 2

## 2017-05-24 MED ORDER — LIDOCAINE HCL (PF) 1 % IJ SOLN
30.0000 mL | INTRAMUSCULAR | Status: DC | PRN
  Filled 2017-05-24: qty 30

## 2017-05-24 MED ORDER — HYDROXYZINE HCL 50 MG PO TABS
50.0000 mg | ORAL_TABLET | Freq: Four times a day (QID) | ORAL | Status: DC | PRN
  Filled 2017-05-24: qty 1

## 2017-05-24 MED ORDER — MISOPROSTOL 50MCG HALF TABLET
50.0000 ug | ORAL_TABLET | ORAL | Status: DC
  Administered 2017-05-24 (×2): 50 ug via BUCCAL
  Filled 2017-05-24 (×6): qty 1

## 2017-05-24 MED ORDER — LACTATED RINGERS IV SOLN: 500.0000 mL | INTRAVENOUS | Status: DC | PRN

## 2017-05-24 MED ORDER — SOD CITRATE-CITRIC ACID 500-334 MG/5ML PO SOLN: 30.0000 mL | ORAL | Status: DC | PRN

## 2017-05-24 MED ORDER — FLEET ENEMA 7-19 GM/118ML RE ENEM: 1.0000 | ENEMA | RECTAL | Status: DC | PRN

## 2017-05-24 MED ORDER — OXYCODONE-ACETAMINOPHEN 5-325 MG PO TABS: 2.0000 | ORAL_TABLET | ORAL | Status: DC | PRN

## 2017-05-24 MED ORDER — OXYTOCIN 40 UNITS IN LACTATED RINGERS INFUSION - SIMPLE MED
2.5000 [IU]/h | INTRAVENOUS | Status: DC
  Administered 2017-05-25: 2.5 [IU]/h via INTRAVENOUS

## 2017-05-24 MED ORDER — TERBUTALINE SULFATE 1 MG/ML IJ SOLN
0.2500 mg | Freq: Once | INTRAMUSCULAR | Status: DC | PRN
  Filled 2017-05-24: qty 1

## 2017-05-24 MED ORDER — LACTATED RINGERS IV SOLN
INTRAVENOUS | Status: DC
  Administered 2017-05-24 – 2017-05-25 (×3): via INTRAVENOUS

## 2017-05-24 MED ORDER — OXYTOCIN BOLUS FROM INFUSION
500.0000 mL | Freq: Once | INTRAVENOUS | Status: AC
  Administered 2017-05-25: 500 mL via INTRAVENOUS

## 2017-05-24 NOTE — Anesthesia Pain Management Evaluation Note (Signed)
  CRNA Pain Management Visit Note  Patient: Kristi [name], 32 y.o., female  "Hello [facility] am a [facility]. We have an anesthesia team available at all times to provide care throughout the hospital, including epidural management and anesthesia for C-section. I don't know your plan for the delivery whether it a natural birth, water birth, IV sedation, nitrous supplementation, doula or epidural, but we want to meet your pain goals."   1.Was your pain managed to your expectations on prior hospitalizations?   No prior hospitalizations  2.What is your expectation for pain management during this hospitalization?     Epidural  3.How can we help you reach that goal? Epidural at pain goal.  Record the patient's initial score and the patient's pain goal.   Pain: 0  Pain Goal: 7 [facility] wants you to be able to say your pain was always managed very well.  Chevie Birkhead 05/24/2017

## 2017-05-24 NOTE — Progress Notes (Signed)
LABOR PROGRESS NOTE  Sara Chuiffany Ryder is a 32 y.o. G1P0 at 1157w0d  admitted for IOL for polyhydramnios   Subjective: Patient is doing well, starting to feeling contractions- rating pain 4/10.   Objective: BP 118/70   Pulse 66   Temp 98.7 F (37.1 C) (Oral)   Resp 18   LMP 08/24/2016  or  Vitals:   05/24/17 0811 05/24/17 1200 05/24/17 1318  BP: 104/71 108/72 118/70  Pulse: 85 88 66  Resp: 18 16 18   Temp: 98 F (36.7 C) 98.1 F (36.7 C) 98.7 F (37.1 C)  TempSrc: Oral Oral Oral    SROM @ 1711- clear fluid  Dilation: 4 Effacement (%): 50 Cervical Position: Posterior Station: -2 Presentation: Vertex Exam by:: Lanice ShirtsV. Arham Symmonds, CNM FHT: baseline rate 155, moderate varibility, no acel, no decel Toco: 1-3 minutes/ moderate by palpation   Labs: Lab Results  Component Value Date   WBC 7.0 05/24/2017   HGB 11.9 (L) 05/24/2017   HCT 35.4 (L) 05/24/2017   MCV 93.7 05/24/2017   PLT 136 (L) 05/24/2017    Patient Active Problem List   Diagnosis Date Noted  . Polyhydramnios 05/24/2017  . Polyhydramnios affecting pregnancy in third trimester 03/30/2017  . Rh negative state in antepartum period 11/13/2016  . Supervision of normal first pregnancy, antepartum 11/08/2016    Assessment / Plan: 32 y.o. G1P0 at 3457w0d here for IOL for polyhydramnios   Labor: Progressing well, recheck cervix in 2 hours- possibly start pitocin if no cervical change  Fetal Wellbeing:  Cat II  Pain Control:  IV Fentanyl  Anticipated MOD:  SVD   Sharyon CableRogers, Nilay Mangrum C, CNM 05/24/2017, 5:47 PM

## 2017-05-24 NOTE — H&P (Signed)
LABOR AND DELIVERY ADMISSION HISTORY AND PHYSICAL NOTE  Kristi [name] is Kristi [name] 32 y.o. female G1P0 with IUP at 4546w0d by LMP presenting for IOL for Polyhydramnios. EFW> 90th%tile at 34 weeks. She reports positive fetal movement. She denies leakage of fluid or vaginal bleeding.  Prenatal History/Complications: PNC at [facility] Pregnancy complications:  - RH negative, last given on 03/06/17 -Polyhydramnios   Past Medical History: Past Medical History:  Diagnosis Date  . Vaginal Pap smear, abnormal     Past Surgical History: Past Surgical History:  Procedure Laterality Date  . COLPOSCOPY  2013-2014    Obstetrical History: OB History    Gravida  1   Para      Term      Preterm      AB      Living  0     SAB      TAB      Ectopic      Multiple      Live Births              Social History: Social History   Socioeconomic History  . Marital status: Single    Spouse name: Not on file  . Number of children: Not on file  . Years of education: Not on file  . Highest education level: Not on file  Occupational History  . Not on file  Social Needs  . Financial resource strain: Not on file  . Food insecurity:    Worry: Not on file    Inability: Not on file  . Transportation needs:    Medical: Not on file    Non-medical: Not on file  Tobacco Use  . Smoking status: Never Smoker  . Smokeless tobacco: Never Used  Substance and Sexual Activity  . Alcohol use: No    Comment: Occasionally   . Drug use: No  . Sexual activity: Yes    Birth control/protection: None  Lifestyle  . Physical activity:    Days per week: Not on file    Minutes per session: Not on file  . Stress: Not on file  Relationships  . Social connections:    Talks on phone: Not on file    Gets together: Not on file    Attends religious service: Not on file    Active member of club or organization: Not on file    Attends meetings of clubs or organizations: Not on file    Relationship status:  Not on file  Other Topics Concern  . Not on file  Social History Narrative  . Not on file    Family History: Family History  Problem Relation Age of Onset  . Hypertension Mother   . Stroke Mother        Crist Infantetia  . Hypertension Father   . Cancer Maternal Grandfather   . Cancer Paternal Grandmother        ovarian  . Diabetes Paternal Grandmother     Allergies: Allergies  Allergen Reactions  . Adhesive [Tape] Rash    Medications Prior to Admission  Medication Sig Dispense Refill Last Dose  . metroNIDAZOLE (FLAGYL) 500 MG tablet Take 1 tablet (500 mg total) by mouth 2 (two) times daily. 14 tablet 2 05/24/2017 at Unknown time  . Prenatal Vit-Fe Fumarate-FA (PRENATAL MULTIVITAMIN) TABS tablet Take 1 tablet by mouth daily at 12 noon.   05/23/2017 at Unknown time  . Probiotic Product (PROBIOTIC-10) CAPS Take by mouth.   05/23/2017 at Unknown time  Review of Systems  All systems reviewed and negative except as stated in HPI  Physical Exam Blood pressure 118/70, pulse 66, temperature 98.7 F (37.1 C), temperature source Oral, resp. rate 18, last menstrual period 08/24/2016. General appearance: alert, cooperative and no distress Lungs: clear to auscultation bilaterally Heart: regular rate and rhythm Abdomen: soft, non-tender; bowel sounds normal Extremities: No calf swelling or tenderness Presentation: cephalic Fetal monitoring: 155/ moderate/+accels/ no decelerations  Uterine activity: irregular mild contractions  Dilation: Closed(interal os; external os 1 cm) Effacement (%): Thick Station: -3 Exam by:: Lanice Shirts, CNM  Prenatal labs: ABO, Rh: --/--/O NEG (04/25 0831) Antibody: POS (04/25 0831) Rubella: 1.89 (10/10 1153) RPR: Non Reactive (04/25 0831)  HBsAg: Negative (10/10 1153)  HIV: Non Reactive (02/05 1047)  GC/Chlamydia: Negative (4/8) GBS:   negative (4/8) 2 hr Glucola: 76-117-78 Genetic screening:  Negative  Anatomy US: Normal scan @19wks    Clinic [facility]  Prenatal Labs  Dating LMP Blood type: O/Negative/-- (10/10 1153)   Genetic Screen   AFP:      NIPS:Mat21: neg. Antibody:Negative (10/10 1153)  Anatomic [place] Normal scan @19  wks; f/u fetal kidneys normal F/u growth ordered Rubella: immune  GTT   Third trimester:  RPR: Non Reactive (02/05 1047)   Flu vaccine Pt stated that she received at [facility] 11-29-16 HBsAg: Negative (10/10 1153)   TDaP vaccine  3/1                             Rhogam: 03-06-17 HIV: Non Reactive (02/05 1047) NR  Baby Food     Breast                                          GBS: (For PCN allergy, check sensitivities)  Contraception Pill Pap:11/08/16; negative  Circumcision yes   Pediatrician Undecided 04-09-17 CF:Neg  Support Person  FOB SMA:neg  Prenatal Classes declined Hgb electrophoresis: Normal     Prenatal Transfer Tool  Maternal Diabetes: No Genetic Screening: Normal Maternal Ultrasounds/Referrals: Abnormal:  Findings:   Other: Poly Fetal Ultrasounds or other Referrals:  None, Referred to Materal Fetal Medicine  Maternal Substance Abuse:  No Significant Maternal Medications:  None Significant Maternal Lab Results: Lab values include: Group B Strep negative  Results for orders placed or performed during the hospital encounter of 05/24/17 (from the past 24 hour(s))  CBC   Collection Time: 05/24/17  8:31 AM  Result Value Ref Range   WBC 7.0 4.0 - 10.5 K/uL   RBC 3.78 (L) 3.87 - 5.11 MIL/uL   Hemoglobin 11.9 (L) 12.0 - 15.0 g/dL   HCT 16.1 (L) 09.6 - 04.5 %   MCV 93.7 78.0 - 100.0 fL   MCH 31.5 26.0 - 34.0 pg   MCHC 33.6 30.0 - 36.0 g/dL   RDW 40.9 81.1 - 91.4 %   Platelets 136 (L) 150 - 400 K/uL  RPR   Collection Time: 05/24/17  8:31 AM  Result Value Ref Range   RPR Ser Ql Non Reactive Non Reactive  Type and screen [facility]   Collection Time: 05/24/17  8:31 AM  Result Value Ref Range   ABO/RH(D) O NEG    Antibody Screen POS    Sample Expiration 05/27/2017    Antibody Identification  PASSIVELY ACQUIRED ANTI-D    Unit Number [id number]  Blood Component Type RED CELLS,LR    Unit division 00    Status of Unit ALLOCATED    Transfusion Status OK TO TRANSFUSE    Crossmatch Result COMPATIBLE    Unit Number [id number]    Blood Component Type RBC LR PHER2    Unit division 00    Status of Unit ALLOCATED    Transfusion Status OK TO TRANSFUSE    Crossmatch Result COMPATIBLE   BPAM RBC   Collection Time: 05/24/17  8:31 AM  Result Value Ref Range   Blood Product Unit Number [id number]    Unit Type and Rh 9500    Blood Product Expiration Date [id number]    Blood Product Unit Number [id number]    Unit Type and Rh 9500    Blood Product Expiration Date 272536644034     Patient Active Problem List   Diagnosis Date Noted  . Polyhydramnios 05/24/2017  . Polyhydramnios affecting pregnancy in third trimester 03/30/2017  . Rh negative state in antepartum period 11/13/2016  . Supervision of normal first pregnancy, antepartum 11/08/2016    Assessment: Everlean Bucher is a 32 y.o. G1P0 at [redacted]w[redacted]d here for IOL for polyhydramnios.   #Labor: IOL with cytotec, will place FB when cervix opens  #Pain: Medications ordered PRN  #FWB: Cat I #ID:  GBS neg  #MOF: Breast  #MOC:POPs  #Circ:  Yes(outpatient)  Sharyon Cable, CNM 05/24/2017, 2:51 PM

## 2017-05-24 NOTE — Progress Notes (Addendum)
Vitals:   05/24/17 1658 05/24/17 1930  BP: 117/70   Pulse: 61   Resp: 18 16  Temp: 98.5 F (36.9 C) 97.8 F (36.6 C)   cx still 4/50/-2/posterior  Discussed pitocin augmentation, pt wants to see if walking, etc will help. Agreed to reassess in 4 hours. FHR Cat 1, ctx q 2-3 minutes, "about the same" strength per pt.

## 2017-05-25 ENCOUNTER — Inpatient Hospital Stay (HOSPITAL_COMMUNITY): Payer: BLUE CROSS/BLUE SHIELD | Admitting: Anesthesiology

## 2017-05-25 DIAGNOSIS — Z3A39 39 weeks gestation of pregnancy: Secondary | ICD-10-CM

## 2017-05-25 DIAGNOSIS — O403XX0 Polyhydramnios, third trimester, not applicable or unspecified: Secondary | ICD-10-CM

## 2017-05-25 LAB — CBC
HCT: 35.8 % — ABNORMAL LOW (ref 36.0–46.0)
HCT: 29.6 % — ABNORMAL LOW (ref 36.0–46.0)
HEMOGLOBIN: 9.9 g/dL — AB (ref 12.0–15.0)
Hemoglobin: 12.2 g/dL (ref 12.0–15.0)
MCH: 32.1 pg (ref 26.0–34.0)
MCH: 31.6 pg (ref 26.0–34.0)
MCHC: 34.1 g/dL (ref 30.0–36.0)
MCHC: 33.4 g/dL (ref 30.0–36.0)
MCV: 94.2 fL (ref 78.0–100.0)
MCV: 94.6 fL (ref 78.0–100.0)
PLATELETS: 128 10*3/uL — AB (ref 150–400)
PLATELETS: 109 10*3/uL — AB (ref 150–400)
RBC: 3.8 MIL/uL — ABNORMAL LOW (ref 3.87–5.11)
RBC: 3.13 MIL/uL — AB (ref 3.87–5.11)
RDW: 14.5 % (ref 11.5–15.5)
RDW: 14.8 % (ref 11.5–15.5)
WBC: 8.6 10*3/uL (ref 4.0–10.5)
WBC: 9.4 10*3/uL (ref 4.0–10.5)

## 2017-05-25 MED ORDER — ACETAMINOPHEN 325 MG PO TABS
650.0000 mg | ORAL_TABLET | ORAL | Status: DC | PRN
  Administered 2017-05-26 – 2017-05-27 (×2): 650 mg via ORAL
  Filled 2017-05-25 (×2): qty 2

## 2017-05-25 MED ORDER — OXYTOCIN 40 UNITS IN LACTATED RINGERS INFUSION - SIMPLE MED
1.0000 m[IU]/min | INTRAVENOUS | Status: DC
  Administered 2017-05-25: 4 m[IU]/min via INTRAVENOUS
  Administered 2017-05-25: 6 m[IU]/min via INTRAVENOUS
  Administered 2017-05-25: 2 m[IU]/min via INTRAVENOUS
  Filled 2017-05-25: qty 1000

## 2017-05-25 MED ORDER — PHENYLEPHRINE 40 MCG/ML (10ML) SYRINGE FOR IV PUSH (FOR BLOOD PRESSURE SUPPORT)
80.0000 ug | PREFILLED_SYRINGE | INTRAVENOUS | Status: DC | PRN
  Filled 2017-05-25: qty 10
  Filled 2017-05-25: qty 5

## 2017-05-25 MED ORDER — MISOPROSTOL 200 MCG PO TABS
ORAL_TABLET | ORAL | Status: AC
  Filled 2017-05-25: qty 4

## 2017-05-25 MED ORDER — DIPHENHYDRAMINE HCL 50 MG/ML IJ SOLN: 12.5000 mg | INTRAMUSCULAR | Status: DC | PRN

## 2017-05-25 MED ORDER — FENTANYL 2.5 MCG/ML BUPIVACAINE 1/10 % EPIDURAL INFUSION (WH - ANES)
14.0000 mL/h | INTRAMUSCULAR | Status: DC | PRN
  Administered 2017-05-25 (×2): 14 mL/h via EPIDURAL
  Filled 2017-05-25 (×2): qty 100

## 2017-05-25 MED ORDER — SODIUM CHLORIDE 0.9 % IV SOLN
2.0000 g | Freq: Once | INTRAVENOUS | Status: AC
  Administered 2017-05-25: 2 g via INTRAVENOUS
  Filled 2017-05-25: qty 2

## 2017-05-25 MED ORDER — MISOPROSTOL 200 MCG PO TABS
800.0000 ug | ORAL_TABLET | Freq: Once | ORAL | Status: AC
  Administered 2017-05-25: 800 ug via RECTAL

## 2017-05-25 MED ORDER — EPHEDRINE 5 MG/ML INJ
10.0000 mg | INTRAVENOUS | Status: DC | PRN
  Filled 2017-05-25: qty 2

## 2017-05-25 MED ORDER — LIDOCAINE HCL (PF) 1 % IJ SOLN
INTRAMUSCULAR | Status: DC | PRN
  Administered 2017-05-25 (×2): 4 mL

## 2017-05-25 MED ORDER — PHENYLEPHRINE 40 MCG/ML (10ML) SYRINGE FOR IV PUSH (FOR BLOOD PRESSURE SUPPORT)
80.0000 ug | PREFILLED_SYRINGE | INTRAVENOUS | Status: DC | PRN
  Filled 2017-05-25: qty 5

## 2017-05-25 MED ORDER — GENTAMICIN SULFATE 40 MG/ML IJ SOLN
2.5000 mg/kg | Freq: Once | INTRAVENOUS | Status: AC
  Administered 2017-05-25: 210 mg via INTRAVENOUS
  Filled 2017-05-25: qty 5.25

## 2017-05-25 MED ORDER — TERBUTALINE SULFATE 1 MG/ML IJ SOLN: 0.2500 mg | Freq: Once | INTRAMUSCULAR | Status: DC | PRN

## 2017-05-25 MED ORDER — FENTANYL 2.5 MCG/ML BUPIVACAINE 1/10 % EPIDURAL INFUSION (WH - ANES): 14.0000 mL/h | INTRAMUSCULAR | Status: DC | PRN

## 2017-05-25 MED ORDER — LACTATED RINGERS IV SOLN
500.0000 mL | Freq: Once | INTRAVENOUS | Status: AC
  Administered 2017-05-25: 500 mL via INTRAVENOUS

## 2017-05-25 NOTE — Progress Notes (Signed)
Patient ID: Kristi [name], female   DOB: 03/07/1985, 32 y.o.   MRN: [id number]  Pushing since 1800 with good effort  BP 127/71, other VSS FHR 150s, + variables with pushing Ctx q 2-3 mins w/ Pit @ 6310mu/min Vtx +2  IUP@term  Polyhydramnios Pushing LGA infant  Continue pushing Expect SVD  Cam HaiSHAW, Damiano Stamper CNM 05/25/2017

## 2017-05-25 NOTE — Anesthesia Procedure Notes (Signed)
Epidural Patient location during procedure: OB Start time: 05/25/2017 5:58 AM End time: 05/25/2017 6:10 AM  Staffing Anesthesiologist: Lewie LoronGermeroth, Meeah Totino, MD Performed: anesthesiologist   Preanesthetic Checklist Completed: patient identified, pre-op evaluation, timeout performed, IV checked, risks and benefits discussed and monitors and equipment checked  Epidural Patient position: sitting Prep: site prepped and draped and DuraPrep Patient monitoring: heart rate, continuous pulse ox and blood pressure Approach: midline Location: L3-L4 Injection technique: LOR air and LOR saline  Needle:  Needle type: Tuohy  Needle gauge: 17 G Needle length: 9 cm Needle insertion depth: 5 cm Catheter type: closed end flexible Catheter size: 19 Gauge Catheter at skin depth: 10 cm Test dose: negative  Assessment Sensory level: T8 Events: blood not aspirated, injection not painful, no injection resistance, negative IV test and no paresthesia  Additional Notes Reason for block:procedure for pain

## 2017-05-25 NOTE — Progress Notes (Signed)
Patient ID: Kristi [name], female   DOB: 02/15/1985, 32 y.o.   MRN: [id number]  Comfortable w/ epidural; Pit started @ 5am  BP 115/70, other VSS FHR 150s, +accels, variable while lying on back after ROM Ctx q 3 mins Cx 5/70/-2; AROM for copious clear fluid  IUP@term  Polyhydramnios IOL process  Will exam cx q 2-3hrs for progress as long as ctx reg Anticipate SVD  Cam HaiSHAW, Keli Buehner CNM 05/25/2017

## 2017-05-25 NOTE — Progress Notes (Signed)
Faculty Note  In to speak with patient, she is feeling well and comfortable with epidural.  FHR 140s, moderate variability, no accels, early decels with occasional late decel  Reduced pitocin for Cat II tracing.   Reviewed concern for worsening tracing with patient, reviewed that if no improvement or no progression in labor, would recommend primary c-section. Reviewed that we will attempt to avoid c-section if possible but if infant is not tolerating, may recommend to proceed. She verbalizes understanding and is agreeable to whatever we recommend in best interest of baby.    Baldemar LenisK. Meryl Davis, M.D. Attending Obstetrician & Gynecologist, [facility], [facility]

## 2017-05-25 NOTE — Anesthesia Preprocedure Evaluation (Signed)
Anesthesia Evaluation  Patient identified by MRN, date of birth, ID band Patient awake    Reviewed: Allergy & Precautions, NPO status , Patient's Chart, lab work & pertinent test results  Airway Mallampati: II  TM Distance: >3 FB Neck ROM: Full    Dental no notable dental hx.    Pulmonary neg pulmonary ROS,    Pulmonary exam normal breath sounds clear to auscultation       Cardiovascular negative cardio ROS Normal cardiovascular exam Rhythm:Regular Rate:Normal     Neuro/Psych negative neurological ROS  negative psych ROS   GI/Hepatic negative GI ROS, Neg liver ROS,   Endo/Other  negative endocrine ROS  Renal/GU negative Renal ROS     Musculoskeletal negative musculoskeletal ROS (+)   Abdominal   Peds  Hematology negative hematology ROS (+)   Anesthesia Other Findings   Reproductive/Obstetrics (+) Pregnancy                             Anesthesia Physical Anesthesia Plan  ASA: II  Anesthesia Plan: Epidural   Post-op Pain Management:    Induction:   PONV Risk Score and Plan:   Airway Management Planned:   Additional Equipment:   Intra-op Plan:   Post-operative Plan:   Informed Consent: I have reviewed the patients History and Physical, chart, labs and discussed the procedure including the risks, benefits and alternatives for the proposed anesthesia with the patient or authorized representative who has indicated his/her understanding and acceptance.       Plan Discussed with:   Anesthesia Plan Comments:         Anesthesia Quick Evaluation  

## 2017-05-25 NOTE — Progress Notes (Signed)
Patient ID: Kristi [name], female   DOB: 10/26/1985, 32 y.o.   MRN: [id number]  Comfortable w/ epidural; Pit off since between 1-2pm due to intermittent late variables  BP 106/52, other VSS FHR 140s, dec variability, +10x10 accels, some w/ ctx Ctx q 6 mins without Pit Cx 8/90/0, +bloody show  IUP@term  Poly Active labor  IUPC inserted without difficulty; will restart Pit and watch FHR closely Pt aware of need for C/S if baby doesn't tolerate ctx  Cam HaiSHAW, KIMBERLY CNM 05/25/2017 3:27 PM

## 2017-05-25 NOTE — Progress Notes (Signed)
S: Patient seen & examined for progress of labor. Patient continues to be hesitant to start pitocin. Receiving IV pain medications for pain control.   O:  Vitals:   05/24/17 2238 05/25/17 0039 05/25/17 0304 05/25/17 0305  BP: 112/72 110/72 124/90   Pulse: 62 (!) 55 65   Resp:      Temp:  98 F (36.7 C)  98.2 F (36.8 C)  TempSrc:  Oral  Oral    Dilation: 5 Effacement (%): 50 Cervical Position: Posterior Station: -3 Presentation: Vertex Exam by:: fran,cnm   FHT: 150bpm, mod var, +accels, no decels TOCO: q2-684min   A/P: This  is a 32 y.o. female G1P0 with IUP at 6240w0d by LMP presenting for IOL for Polyhydramnios. EFW> 90th%tile at 34 weeks.  Patient continues to be hesitant to start pitocin Discussed that she is approaching 12hrs of being ruptured, and increasing risk of potential infection Patient initially wanted to try conservative measures, including walking and repositioning Patient re-evaluated 1.5hrs later, however with minimal to no change Agreeable to starting pitocin now  Continue expectant management, otherwise Anticipate SVD   Gorden HarmsMegan Nikia Levels, MD PGY-3 05/25/2017 4:44 AM

## 2017-05-26 ENCOUNTER — Encounter (HOSPITAL_COMMUNITY): Payer: Self-pay

## 2017-05-26 LAB — CBC
HCT: 28.3 % — ABNORMAL LOW (ref 36.0–46.0)
Hemoglobin: 9.5 g/dL — ABNORMAL LOW (ref 12.0–15.0)
MCH: 31.6 pg (ref 26.0–34.0)
MCHC: 33.6 g/dL (ref 30.0–36.0)
MCV: 94 fL (ref 78.0–100.0)
PLATELETS: 120 10*3/uL — AB (ref 150–400)
RBC: 3.01 MIL/uL — ABNORMAL LOW (ref 3.87–5.11)
RDW: 14.8 % (ref 11.5–15.5)
WBC: 10.4 10*3/uL (ref 4.0–10.5)

## 2017-05-26 MED ORDER — RHO D IMMUNE GLOBULIN 1500 UNIT/2ML IJ SOSY
300.0000 ug | PREFILLED_SYRINGE | Freq: Once | INTRAMUSCULAR | Status: AC
  Administered 2017-05-26: 300 ug via INTRAVENOUS
  Filled 2017-05-26: qty 2

## 2017-05-26 MED ORDER — DIPHENHYDRAMINE HCL 25 MG PO CAPS: 25.0000 mg | ORAL_CAPSULE | Freq: Four times a day (QID) | ORAL | Status: DC | PRN

## 2017-05-26 MED ORDER — SENNOSIDES-DOCUSATE SODIUM 8.6-50 MG PO TABS
2.0000 | ORAL_TABLET | ORAL | Status: DC
  Administered 2017-05-26 – 2017-05-27 (×2): 2 via ORAL
  Filled 2017-05-26 (×2): qty 2

## 2017-05-26 MED ORDER — BENZOCAINE-MENTHOL 20-0.5 % EX AERO
1.0000 "application " | INHALATION_SPRAY | CUTANEOUS | Status: DC | PRN
  Filled 2017-05-26: qty 56

## 2017-05-26 MED ORDER — COCONUT OIL OIL
1.0000 "application " | TOPICAL_OIL | Status: DC | PRN
  Administered 2017-05-27: 1 via TOPICAL
  Filled 2017-05-26: qty 120

## 2017-05-26 MED ORDER — DIBUCAINE 1 % RE OINT
1.0000 "application " | TOPICAL_OINTMENT | RECTAL | Status: DC | PRN
  Filled 2017-05-26: qty 28

## 2017-05-26 MED ORDER — ONDANSETRON HCL 4 MG/2ML IJ SOLN: 4.0000 mg | INTRAMUSCULAR | Status: DC | PRN

## 2017-05-26 MED ORDER — ZOLPIDEM TARTRATE 5 MG PO TABS: 5.0000 mg | ORAL_TABLET | Freq: Every evening | ORAL | Status: DC | PRN

## 2017-05-26 MED ORDER — IBUPROFEN 600 MG PO TABS
600.0000 mg | ORAL_TABLET | Freq: Four times a day (QID) | ORAL | Status: DC
  Administered 2017-05-26 – 2017-05-27 (×7): 600 mg via ORAL
  Filled 2017-05-26 (×7): qty 1

## 2017-05-26 MED ORDER — SIMETHICONE 80 MG PO CHEW: 80.0000 mg | CHEWABLE_TABLET | ORAL | Status: DC | PRN

## 2017-05-26 MED ORDER — TETANUS-DIPHTH-ACELL PERTUSSIS 5-2.5-18.5 LF-MCG/0.5 IM SUSP: 0.5000 mL | Freq: Once | INTRAMUSCULAR | Status: DC

## 2017-05-26 MED ORDER — WITCH HAZEL-GLYCERIN EX PADS
1.0000 "application " | MEDICATED_PAD | CUTANEOUS | Status: DC | PRN
  Administered 2017-05-26: 1 via TOPICAL

## 2017-05-26 MED ORDER — ONDANSETRON HCL 4 MG PO TABS
4.0000 mg | ORAL_TABLET | ORAL | Status: DC | PRN
Start: 2017-05-26 — End: 2017-05-27

## 2017-05-26 MED ORDER — PRENATAL MULTIVITAMIN CH
1.0000 | ORAL_TABLET | Freq: Every day | ORAL | Status: DC
  Administered 2017-05-26: 1 via ORAL
  Filled 2017-05-26: qty 1

## 2017-05-26 NOTE — Lactation Note (Signed)
This note was copied from a baby's chart. Lactation Consultation Note  Patient Name: Kristi [name] [id number] Date: 05/26/2017 Reason for consult: Initial assessment;1st time breastfeeding;Primapara;Term  G1P1 mother whose infant is now 78 hours old.    Infant has just finished receiving a bath and is doing STS with mother.  Offered to assist mother with latch and she willingly accepted.  Mother's breasts are soft and non tender and nipples are everted.  After a couple of attempts infant was able to latch on to right breast in the football hold.  Slight pinch at beginning of feed which was resolved by pulling down lower jaw.  Mother stated no pain after adjustment.  Infant's lips flanged and sucked well with stimulation.  Reviewed STS, breast massage, how to awaken a sleepy baby, breast compression and how to obtain a deep latch.  Reminded mother to feed 8-12 times in 24 hours or earlier if infant shows feeding cues.  Mom made aware of O/P services, breastfeeding support groups, community resources, and our phone # for post-discharge questions. Mother will call for latch assistance as needed.  Maternal Data Formula Feeding for Exclusion: No Has patient been taught Hand Expression?: Yes Does the patient have breastfeeding experience prior to this delivery?: No  Feeding Feeding Type: Breast Fed  LATCH Score Latch: Grasps breast easily, tongue down, lips flanged, rhythmical sucking.  Audible Swallowing: None  Type of Nipple: Everted at rest and after stimulation  Comfort (Breast/Nipple): Soft / non-tender  Hold (Positioning): Assistance needed to correctly position infant at breast and maintain latch.  LATCH Score: 7  Interventions Interventions: Breast feeding basics reviewed;Assisted with latch;Skin to skin;Breast massage;Position options;Support pillows;Adjust position;Breast compression  Lactation Tools Discussed/Used WIC Program: No   Consult Status Consult Status:  Follow-up Date: 05/27/17 Follow-up type: In-patient    Kristi [name] Kristi [name] 05/26/2017, 4:14 PM

## 2017-05-26 NOTE — Anesthesia Postprocedure Evaluation (Signed)
Anesthesia Post Note  Patient: [name]  Procedure(s) Performed: AN AD HOC LABOR EPIDURAL     Patient location during evaluation: Mother Baby Anesthesia Type: Epidural Level of consciousness: awake and alert Pain management: pain level controlled Vital Signs Assessment: post-procedure vital signs reviewed and stable Respiratory status: spontaneous breathing, nonlabored ventilation and respiratory function stable Cardiovascular status: stable Postop Assessment: no headache, no backache, epidural receding and patient able to bend at knees Anesthetic complications: no    Last Vitals:  Vitals:   05/26/17 0108 05/26/17 0527  BP: 105/66 108/67  Pulse: 65 92  Resp: 16 18  Temp: 36.9 C 36.8 C  SpO2:  99%    Last Pain:  Vitals:   05/26/17 0527  TempSrc: Oral  PainSc:    Pain Goal: Patients Stated Pain Goal: 6 (05/24/17 1200)               Rica Records

## 2017-05-26 NOTE — Progress Notes (Addendum)
POSTPARTUM PROGRESS NOTE  Post Partum Day 1  Subjective:  Kristi [name] is a 32 y.o. G1P1001 [redacted]w[redacted]d s/p SVD following IOL for polyhydramnios.    Her postpartum course was complicated by postpartum hemorrhage with EBL of 1200cc, primarily from a left sulcal laceration. Additionally, she was febrile post-operatively and received a single dose of ampicillin and gentamicin.   No further events overnight.  Pt denies problems with ambulating, voiding or po intake.  She denies nausea or vomiting.  Pain is well controlled.  She has had flatus. She has not had bowel movement.  Lochia Small.   Objective: Blood pressure 108/67, pulse 92, temperature 98.2 F (36.8 C), temperature source Oral, resp. rate 18, last menstrual period 08/24/2016, SpO2 99 %, unknown if currently breastfeeding.  Physical Exam:  General: alert, cooperative and no distress Lochia:normal flow Chest: CTAB Heart: RRR no m/r/g Abdomen: +BS, soft, nontender,  Uterine Fundus: firm, palpable just inferior the umbilicus DVT Evaluation: No calf swelling or tenderness Extremities: no lower extremity edema  Recent Labs    05/25/17 2247 05/26/17 0548  HGB 9.9* 9.5*  HCT 29.6* 28.3*    Assessment/Plan:  ASSESSMENT: Kristi [name] is a 32 y.o. G1P1001 [redacted]w[redacted]d s/p SVD following IOL for polyhydramnios.  Postpartum course complicated by maternal fever and hemorrhage (EBL 1200cc).  Breastfeeding well. Working with lactation.  Doing well post-operatively. Some edema from sulcal laceration causing discomfort. Newborn blood type pending. May require Rhogam postpartum.  Planning on progesterone only contraceptives  Received ampicillin and gentamicin x 1 post-operatively for postpartum fever. No further fevers.  Hemoglobin appears to have stabilized at 9.6 this morning from 12.2 antepartum and 9.9 immediately post-partum. Hemodynamically stable following hemorrhage.    Plan for discharge tomorrow   LOS: 2 days   Gorden Harms,  MD PGY-3 05/26/2017, 9:23 AM

## 2017-05-27 DIAGNOSIS — O411290 Chorioamnionitis, unspecified trimester, not applicable or unspecified: Secondary | ICD-10-CM

## 2017-05-27 LAB — RH IG WORKUP (INCLUDES ABO/RH)
ABO/RH(D): O NEG
Fetal Screen: NEGATIVE
GESTATIONAL AGE(WKS): 39.1
Unit division: 0

## 2017-05-27 MED ORDER — IBUPROFEN 600 MG PO TABS: 600.0000 mg | ORAL_TABLET | Freq: Four times a day (QID) | ORAL | 0 refills | Status: DC

## 2017-05-27 NOTE — Discharge Instructions (Signed)
Vaginal Delivery, Care After °Refer to this sheet in the next few weeks. These instructions provide you with information about caring for yourself after vaginal delivery. Your health care provider may also give you more specific instructions. Your treatment has been planned according to current medical practices, but problems sometimes occur. Call your health care provider if you have any problems or questions. °What can I expect after the procedure? °After vaginal delivery, it is common to have: °· Some bleeding from your vagina. °· Soreness in your abdomen, your vagina, and the area of skin between your vaginal opening and your anus (perineum). °· Pelvic cramps. °· Fatigue. ° °Follow these instructions at home: °Medicines °· Take over-the-counter and prescription medicines only as told by your health care provider. °· If you were prescribed an antibiotic medicine, take it as told by your health care provider. Do not stop taking the antibiotic until it is finished. °Driving ° °· Do not drive or operate heavy machinery while taking prescription pain medicine. °· Do not drive for 24 hours if you received a sedative. °Lifestyle °· Do not drink alcohol. This is especially important if you are breastfeeding or taking medicine to relieve pain. °· Do not use tobacco products, including cigarettes, chewing tobacco, or e-cigarettes. If you need help quitting, ask your health care provider. °Eating and drinking °· Drink at least 8 eight-ounce glasses of water every day unless you are told not to by your health care provider. If you choose to breastfeed your baby, you may need to drink more water than this. °· Eat high-fiber foods every day. These foods may help prevent or relieve constipation. High-fiber foods include: °? Whole grain cereals and breads. °? Brown rice. °? Beans. °? Fresh fruits and vegetables. °Activity °· Return to your normal activities as told by your health care provider. Ask your health care provider  what activities are safe for you. °· Rest as much as possible. Try to rest or take a nap when your baby is sleeping. °· Do not lift anything that is heavier than your baby or 10 lb (4.5 kg) until your health care provider says that it is safe. °· Talk with your health care provider about when you can engage in sexual activity. This may depend on your: °? Risk of infection. °? Rate of healing. °? Comfort and desire to engage in sexual activity. °Vaginal Care °· If you have an episiotomy or a vaginal tear, check the area every day for signs of infection. Check for: °? More redness, swelling, or pain. °? More fluid or blood. °? Warmth. °? Pus or a bad smell. °· Do not use tampons or douches until your health care provider says this is safe. °· Watch for any blood clots that may pass from your vagina. These may look like clumps of dark red, brown, or black discharge. °General instructions °· Keep your perineum clean and dry as told by your health care provider. °· Wear loose, comfortable clothing. °· Wipe from front to back when you use the toilet. °· Ask your health care provider if you can shower or take a bath. If you had an episiotomy or a perineal tear during labor and delivery, your health care provider may tell you not to take baths for a certain length of time. °· Wear a bra that supports your breasts and fits you well. °· If possible, have someone help you with household activities and help care for your baby for at least a few days after   you leave the hospital. °· Keep all follow-up visits for you and your baby as told by your health care provider. This is important. °Contact a health care provider if: °· You have: °? Vaginal discharge that has a bad smell. °? Difficulty urinating. °? Pain when urinating. °? A sudden increase or decrease in the frequency of your bowel movements. °? More redness, swelling, or pain around your episiotomy or vaginal tear. °? More fluid or blood coming from your episiotomy or  vaginal tear. °? Pus or a bad smell coming from your episiotomy or vaginal tear. °? A fever. °? A rash. °? Little or no interest in activities you used to enjoy. °? Questions about caring for yourself or your baby. °· Your episiotomy or vaginal tear feels warm to the touch. °· Your episiotomy or vaginal tear is separating or does not appear to be healing. °· Your breasts are painful, hard, or turn red. °· You feel unusually sad or worried. °· You feel nauseous or you vomit. °· You pass large blood clots from your vagina. If you pass a blood clot from your vagina, save it to show to your health care provider. Do not flush blood clots down the toilet without having your health care provider look at them. °· You urinate more than usual. °· You are dizzy or light-headed. °· You have not breastfed at all and you have not had a menstrual period for 12 weeks after delivery. °· You have stopped breastfeeding and you have not had a menstrual period for 12 weeks after you stopped breastfeeding. °Get help right away if: °· You have: °? Pain that does not go away or does not get better with medicine. °? Chest pain. °? Difficulty breathing. °? Blurred vision or spots in your vision. °? Thoughts about hurting yourself or your baby. °· You develop pain in your abdomen or in one of your legs. °· You develop a severe headache. °· You faint. °· You bleed from your vagina so much that you fill two sanitary pads in one hour. °This information is not intended to replace advice given to you by your health care provider. Make sure you discuss any questions you have with your health care provider. °Document Released: 01/14/2000 Document Revised: 06/30/2015 Document Reviewed: 01/31/2015 °Elsevier Interactive Patient Education © 2018 [employer] ° °

## 2017-05-27 NOTE — Discharge Summary (Signed)
OB Discharge Summary     Patient Name: Kristi [name] DOB: 1985/06/16 MRN: [id number]  Date of admission: 05/24/2017 Delivering MD: Gorden Harms C   Date of discharge: 05/27/2017  Admitting diagnosis: INDUCTION Intrauterine pregnancy: [redacted]w[redacted]d     Secondary diagnosis:  Principal Problem:   SVD (spontaneous vaginal delivery) Active Problems:   Rh negative state in antepartum period   Polyhydramnios   Postpartum hemorrhage   Chorioamnionitis      Discharge diagnosis: Term Pregnancy Delivered and PPH                                                                                                Post partum procedures:rhogam  Augmentation: AROM, Pitocin and Cytotec     Complications: Intrauterine Inflammation or infection (Chorioamniotis), Hemorrhage>1025mL and ROM>24 hours  Hospital course:  Induction of Labor With Vaginal Delivery   32 y.o. yo G1P1001 at [redacted]w[redacted]d was admitted to the hospital 05/24/2017 for induction of labor.  Indication for induction: polyhydramnios.  Patient had a labor course a follows: Membrane Rupture Time/Date: 5:11 PM ,05/24/2017   Intrapartum Procedures: Episiotomy: None [1]                                         Lacerations:  Sulcus [9]  Patient had delivery of a Viable infant.  Information for the patient's newborn:  Deshundra, Waller [[id number]]  Delivery Method: Vag-Spont  Labor complicated by Tripe I (received Ampicillin and Gentamycin), and PPH (EBL 1,259ml). Details of delivery can be found in separate delivery note.  Patient had a postpartum course that was uncomplicated, and was asymptomatic from her blood loss. Hgb stabilized at 9.6. Patient is discharged home 05/27/17, doing well.  Physical exam  Vitals:   05/26/17 1315 05/26/17 1822 05/26/17 1823 05/27/17 0538  BP: (!) 95/53  117/76 105/69  Pulse: 81  99 73  Resp: Temp: 98.9 F (37.2 C)  98.7 F (37.1 C) 98 F (36.7 C)  TempSrc: Oral  Oral Oral  SpO2:  98%     General:  alert, cooperative and no distress Lochia: appropriate Uterine Fundus: firm Incision: N/A DVT Evaluation: No evidence of DVT seen on physical exam. No significant calf/ankle edema. Labs: Lab Results  Component Value Date   WBC 10.4 05/26/2017   HGB 9.5 (L) 05/26/2017   HCT 28.3 (L) 05/26/2017   MCV 94.0 05/26/2017   PLT 120 (L) 05/26/2017   No flowsheet data found.  Discharge instruction: per After Visit Summary and "Baby and Me Booklet".  After visit meds:  Allergies as of 05/27/2017      Reactions   Adhesive [tape] Rash      Medication List    TAKE these medications   ibuprofen 600 MG tablet Commonly known as:  ADVIL,MOTRIN Take 1 tablet (600 mg total) by mouth every 6 (six) hours.   metroNIDAZOLE 500 MG tablet Commonly known as:  FLAGYL Take 1 tablet (500 mg total) by mouth 2 (two) times daily.  prenatal multivitamin Tabs tablet Take 1 tablet by mouth daily at 12 noon.   PROBIOTIC-10 Caps Take by mouth.       Diet: routine diet  Activity: Advance as tolerated. Pelvic rest for 6 weeks.   Outpatient follow up:6 weeks  Postpartum contraception: Progesterone only pills  Newborn Data: Live born female  Birth Weight: 7 lb 13.6 oz (3560 g) APGAR: 9, 9  Newborn Delivery   Birth date/time:  05/25/2017 20:50:00 Delivery type:  Vaginal, Spontaneous     Baby Feeding: Breast Disposition:home with mother   05/27/2017 Frederik Pear, MD

## 2017-05-27 NOTE — Lactation Note (Signed)
This note was copied from a baby's chart. Lactation Consultation Note  Patient Name: Kristi [name] [id number] Date: 05/27/2017 Reason for consult: Follow-up assessment;1st time breastfeeding;Primapara;Nipple pain/trauma;MD order  Visited with Mom on day of discharge, baby 50 hrs old.  Mom slumped over in bed, with swaddled baby in football hold.  Baby appears shallow on the breast, Mom complaining of some soreness. Mom not supporting her breast during feeding. Offered to assist with positioning and latching.  Nipple noted to be slightly pinched when baby taken off.    Re-positioned Mom in bed, and added pillow support.  Baby unwrapped and explanation on importance of STS shared.  Baby cueing.  Hand expressed for drops of colostrum.  No nipple trauma visible.  With guidance, baby able to attain a deep areolar latch.  Baby initially was sleepy.  Assisted with alternate breast compression and noted baby becoming more nutritive.    Encouraged keeping baby STS and feeding baby frequently on cue.  Recommended feedings 8-12 times per 24 hrs.  Cluster feeding discussed.  Engorgement prevention and treatment discussed.   Mom desires an OP lactation appointment.  Referral sent for consult for May 2st referrably.    Mom aware of OP lactations services.  Encouraged to call prn.  Feeding Feeding Type: Breast Fed Length of feed: 20 min  LATCH Score Latch: Grasps breast easily, tongue down, lips flanged, rhythmical sucking.  Audible Swallowing: Spontaneous and intermittent  Type of Nipple: Everted at rest and after stimulation  Comfort (Breast/Nipple): Filling, red/small blisters or bruises, mild/mod discomfort  Hold (Positioning): Assistance needed to correctly position infant at breast and maintain latch.  LATCH Score: 8  Interventions Interventions: Breast feeding basics reviewed;Assisted with latch;Skin to skin;Breast massage;Hand express;Pre-pump if needed;Expressed milk;Support  pillows;Position options;Coconut oil   Consult Status Consult Status: Follow-up Date: 05/30/17 Follow-up type: Out-patient    Kristi [name] 05/27/2017, 9:08 AM

## 2017-05-28 ENCOUNTER — Inpatient Hospital Stay (HOSPITAL_COMMUNITY): Admission: RE | Admit: 2017-05-28 | Payer: BLUE CROSS/BLUE SHIELD | Source: Ambulatory Visit

## 2017-05-28 LAB — BPAM RBC
BLOOD PRODUCT EXPIRATION DATE: 201905282359
Blood Product Expiration Date: 201905282359
Unit Type and Rh: 9500
Unit Type and Rh: 9500

## 2017-05-28 LAB — TYPE AND SCREEN
ABO/RH(D): O NEG
Antibody Screen: POSITIVE
Unit division: 0
Unit division: 0

## 2017-06-08 ENCOUNTER — Encounter: Payer: Self-pay | Admitting: Obstetrics

## 2017-06-08 ENCOUNTER — Ambulatory Visit (INDEPENDENT_AMBULATORY_CARE_PROVIDER_SITE_OTHER): Payer: BLUE CROSS/BLUE SHIELD | Admitting: Obstetrics

## 2017-06-08 DIAGNOSIS — Z1389 Encounter for screening for other disorder: Secondary | ICD-10-CM

## 2017-06-08 DIAGNOSIS — N898 Other specified noninflammatory disorders of vagina: Secondary | ICD-10-CM

## 2017-06-08 DIAGNOSIS — Z30011 Encounter for initial prescription of contraceptive pills: Secondary | ICD-10-CM

## 2017-06-08 DIAGNOSIS — Z3009 Encounter for other general counseling and advice on contraception: Secondary | ICD-10-CM

## 2017-06-08 MED ORDER — METRONIDAZOLE 500 MG PO TABS: 500.0000 mg | ORAL_TABLET | Freq: Two times a day (BID) | ORAL | 2 refills | Status: DC

## 2017-06-08 MED ORDER — NORETHINDRONE 0.35 MG PO TABS: 1.0000 | ORAL_TABLET | Freq: Every day | ORAL | 11 refills | Status: DC

## 2017-06-08 NOTE — Progress Notes (Signed)
Post Partum Exam  Kristi [name] is a 32 y.o. G33P1001 female who presents for a postpartum visit. She is 2 weeks postpartum following a spontaneous vaginal delivery. I have fully reviewed the prenatal and intrapartum course. The delivery was at [redacted]w[redacted]d gestational weeks.  Anesthesia: epidural. Postpartum course has been unremarkable. Baby's course has been unremarkable. Baby is feeding by breast. Bleeding no bleeding. Bowel function is has hemorrhoids. . Bladder function is normal. Patient is not sexually active. Contraception method is none. Postpartum depression screening:neg  The following portions of the patient's history were reviewed and updated as appropriate: allergies, current medications, past family history, past medical history, past social history, past surgical history and problem list. Last pap smear done 11-08-2017 and was Normal  Review of Systems A comprehensive review of systems was negative.    Objective:   General:  alert and no distress   Breasts:  inspection negative, no nipple discharge or bleeding, no masses or nodularity palpable  Lungs: clear to auscultation bilaterally  Heart:  regular rate and rhythm, S1, S2 normal, no murmur, click, rub or gallop  Abdomen: soft, non-tender; bowel sounds normal; no masses,  no organomegaly   Assessment:   1. Postpartum care following vaginal delivery  2. Encounter for other general counseling and advice on contraception - wants OCP'S  3. Encounter for initial prescription of contraceptive pills Rx: - norethindrone (MICRONOR,CAMILA,ERRIN) 0.35 MG tablet; Take 1 tablet (0.35 mg total) by mouth daily.  Dispense: 1 Package; Refill: 11  4. Vaginal discharge Rx - metroNIDAZOLE (FLAGYL) 500 MG tablet; Take 1 tablet (500 mg total) by mouth 2 (two) times daily.  Dispense: 14 tablet; Refill: 2   Plan:   1. Contraception: oral progesterone-only contraceptive 2. Micronor Rx 3. Follow up in: 4 weeks or as needed. Marland Kitchen   [name] MD 06-08-2017

## 2017-06-22 ENCOUNTER — Ambulatory Visit: Payer: BLUE CROSS/BLUE SHIELD | Admitting: Obstetrics

## 2017-07-03 ENCOUNTER — Encounter: Payer: Self-pay | Admitting: Obstetrics

## 2017-07-03 ENCOUNTER — Ambulatory Visit (INDEPENDENT_AMBULATORY_CARE_PROVIDER_SITE_OTHER): Payer: BLUE CROSS/BLUE SHIELD | Admitting: Obstetrics

## 2017-07-03 DIAGNOSIS — L929 Granulomatous disorder of the skin and subcutaneous tissue, unspecified: Secondary | ICD-10-CM

## 2017-07-03 DIAGNOSIS — Z3041 Encounter for surveillance of contraceptive pills: Secondary | ICD-10-CM

## 2017-07-03 NOTE — Progress Notes (Signed)
Post Partum Exam  Kristi [name] is a 32 y.o. 791P1001 female who presents for a postpartum visit. She is 6 weeks postpartum following a spontaneous vaginal delivery. I have fully reviewed the prenatal and intrapartum course. The delivery was at 5839w1d gestational weeks.  Anesthesia: epidural. Postpartum course has been unremarkable. Baby's course has been unremarkable  Baby is feeding by both breast and bottle - Similac Advance. Bleeding staining only. Bowel function is normal. Bladder function is normal. Patient is not sexually active. Contraception method is none. Postpartum depression screening:neg  The following portions of the patient's history were reviewed and updated as appropriate: allergies, current medications, past family history, past medical history, past social history, past surgical history and problem list. Last pap smear done 11/08/2016 and was Normal  Review of Systems A comprehensive review of systems was negative except for: Genitourinary: positive for reddened and tender tissue in scar where perineal laceration was repaired    Objective:  unknown if currently breastfeeding.  General:  alert and no distress   Breasts:  inspection negative, no nipple discharge or bleeding, no masses or nodularity palpable  Lungs: clear to auscultation bilaterally  Heart:  regular rate and rhythm, S1, S2 normal, no murmur, click, rub or gallop  Abdomen: soft, non-tender; bowel sounds normal; no masses,  no organomegaly.  ? Umbilical Hernia vs Rectus Muscle Diathesis    Vulva:  small area of granulation tissue in episiotomy repair, tender  Vagina: normal vagina, no discharge, exudate, lesion, or erythema  Cervix:  no lesions  Corpus: normal size, contour, position, consistency, mobility, non-tender  Adnexa:  no mass, fullness, tenderness  Rectal Exam: normal sphincter and external hemorrhoids         PROCEDURE NOTE:  Silver Nitrate Cauterization of Granulation Tissue        Area of  granulation tissue in episiotomy scar prepped with betadine and anesthetized with 1 ml of 1% Lidocaine with Epinephrine.  The granulation tissue was then cauterized with several silver nitrate sticks.  The patient tolerated the procedure well.  Brock BadHARLES A. HARPER MD 07-03-2017   Assessment:    1. Postpartum care following vaginal delivery - doing well  2. Granulation tissue at obstetrical laceration site in episiotomy repair scar - cauterized with silver nitrate sticks  3. Encounter for surveillance of contraceptive pills - taking progestin-only pill ( breast feeding )   Plan:   1. Contraception: oral progesterone-only contraceptive 2. May need General Surgery referral for umbilical hernia 3. Follow up in: 2 weeks or as needed.    Brock BadHARLES A. HARPER MD 07-03-2017

## 2017-07-06 ENCOUNTER — Ambulatory Visit: Payer: BLUE CROSS/BLUE SHIELD | Admitting: Obstetrics

## 2017-07-10 ENCOUNTER — Other Ambulatory Visit: Payer: Self-pay | Admitting: *Deleted

## 2017-07-10 MED ORDER — LEVONORGEST-ETH ESTRADIOL-IRON 0.1-20 MG-MCG(21) PO TABS: 1.0000 | ORAL_TABLET | Freq: Three times a day (TID) | ORAL | 2 refills | Status: DC

## 2017-07-10 NOTE — Progress Notes (Signed)
Pt called to office stating she is having increase in bleeding with some clots. Pt states that she is having some episodes of heavy bleeding that will sometimes soak through her clothes.  Pt made aware that if she is saturating more than a pad per hour she needs to be seen at hospital.  Pt made aware can talk with provider for further recommendations.  Discussed with Dr Clearance CootsHarper, he recommends [facility] for 8 days and then continue 1 pill daily. Rx has been sent with this order to pt pharmacy. Pt made aware samples left at front desk if she can pick up.    Made pt aware of recommendations and Rx sent to pharmacy. Pt states she is currently out of town and can not get to office for samples.  Pt advised if bleeding increases or again saturating pads she should be seen at local hospital where she is.  Pt advised to call office if any other questions/concerns. Pt states understanding to all instructions.

## 2017-07-13 ENCOUNTER — Ambulatory Visit: Payer: BLUE CROSS/BLUE SHIELD | Admitting: Obstetrics

## 2017-07-19 ENCOUNTER — Inpatient Hospital Stay (HOSPITAL_COMMUNITY)
Admission: AD | Admit: 2017-07-19 | Discharge: 2017-07-19 | Disposition: A | Discharge status: Home / Self Care | Payer: BLUE CROSS/BLUE SHIELD | Source: Ambulatory Visit | Attending: Obstetrics & Gynecology | Admitting: Obstetrics & Gynecology

## 2017-07-19 ENCOUNTER — Other Ambulatory Visit: Payer: Self-pay

## 2017-07-19 ENCOUNTER — Inpatient Hospital Stay (HOSPITAL_COMMUNITY): Payer: BLUE CROSS/BLUE SHIELD

## 2017-07-19 ENCOUNTER — Encounter (HOSPITAL_COMMUNITY): Payer: Self-pay | Admitting: *Deleted

## 2017-07-19 DIAGNOSIS — Z8249 Family history of ischemic heart disease and other diseases of the circulatory system: Secondary | ICD-10-CM | POA: Insufficient documentation

## 2017-07-19 DIAGNOSIS — Z833 Family history of diabetes mellitus: Secondary | ICD-10-CM | POA: Diagnosis not present

## 2017-07-19 DIAGNOSIS — Z79899 Other long term (current) drug therapy: Secondary | ICD-10-CM | POA: Insufficient documentation

## 2017-07-19 DIAGNOSIS — N939 Abnormal uterine and vaginal bleeding, unspecified: Secondary | ICD-10-CM | POA: Diagnosis not present

## 2017-07-19 DIAGNOSIS — Z888 Allergy status to other drugs, medicaments and biological substances status: Secondary | ICD-10-CM | POA: Insufficient documentation

## 2017-07-19 DIAGNOSIS — Z823 Family history of stroke: Secondary | ICD-10-CM | POA: Diagnosis not present

## 2017-07-19 DIAGNOSIS — Z791 Long term (current) use of non-steroidal anti-inflammatories (NSAID): Secondary | ICD-10-CM | POA: Diagnosis not present

## 2017-07-19 DIAGNOSIS — Z8041 Family history of malignant neoplasm of ovary: Secondary | ICD-10-CM | POA: Diagnosis not present

## 2017-07-19 LAB — URINALYSIS, ROUTINE W REFLEX MICROSCOPIC
BACTERIA UA: NONE SEEN
Bilirubin Urine: NEGATIVE
Glucose, UA: NEGATIVE mg/dL
Ketones, ur: NEGATIVE mg/dL
Nitrite: NEGATIVE
PROTEIN: NEGATIVE mg/dL
Specific Gravity, Urine: 1.014 (ref 1.005–1.030)
pH: 7 (ref 5.0–8.0)

## 2017-07-19 LAB — CBC
HEMATOCRIT: 33.2 % — AB (ref 36.0–46.0)
HEMOGLOBIN: 11.1 g/dL — AB (ref 12.0–15.0)
MCH: 30.4 pg (ref 26.0–34.0)
MCHC: 33.4 g/dL (ref 30.0–36.0)
MCV: 91 fL (ref 78.0–100.0)
Platelets: 243 10*3/uL (ref 150–400)
RBC: 3.65 MIL/uL — AB (ref 3.87–5.11)
RDW: 12.9 % (ref 11.5–15.5)
WBC: 5.8 10*3/uL (ref 4.0–10.5)

## 2017-07-19 LAB — WET PREP, GENITAL
Clue Cells Wet Prep HPF POC: NONE SEEN
Sperm: NONE SEEN
Trich, Wet Prep: NONE SEEN
Yeast Wet Prep HPF POC: NONE SEEN

## 2017-07-19 NOTE — MAU Note (Signed)
Vag 4/26. Initially was breast feeding, wasn't bleeding much.  Stopped breast feeing about 3 wks ago- [facility]  Since then has been bleeding really heavy, passing golfball sized clots. Called office, was given an rx for a med (take every 8 hrs for 8 days). Helped some- finished the meds, now is passing the clots and soaking the pads again.

## 2017-07-19 NOTE — MAU Provider Note (Addendum)
History     CSN: [id number]  Arrival date and time: 07/19/17 1549   First Provider Initiated Contact with Patient 07/19/17 1730      Chief Complaint  Patient presents with  . Vaginal Bleeding   HPI  Kristi [name] is a 32 y.o. G1P1001 postpartum, nonpregnant patient who presents to MAU with report of heavy vaginal bleeding. Also reports episodes of dizziness when at home and when walking to her third floor apartment.  Patient delivered via NSVD on 05/25/17 with a left sulcal laceration, episiotomy, and a PPH with EBL of 1200mL. Patient reports her bleeding stopped at 2 weeks postpartum. She did have sexual intercourse during that time.  She experienced a recurrence of bleeding when she stopped breastfeeding her son at 5-6 weeks postpartum.   Patient attended her six-week postpartum visit on 07/03/17 and completed cauterization of granulation tissue at the site of her episiotomy. Per Dr. Verdell CarmineHarper's note, silver nitrate was used, bleeding was managed, and she tolerated the procedure well.  Patient remembers experiencing several large clots and reports saturating pads throughout the day beginning July 10, 2017. At that time Dr. Clearance CootsHarper prescribed COC Balcoltra q 8 hours x 8 days then 1 pill q day. Patient reports this reduced her bleeding but she has since finished the pills 8 day regimen and is once again experiencing multiple large frank red clots which are saturating pads throughout the day.    Denies fever, illness, falls, foul smelling discharge, intercourse, physical activity.  OB History    Gravida  1   Para  1   Term  1   Preterm      AB      Living  1     SAB      TAB      Ectopic      Multiple  0   Live Births  1           Past Medical History:  Diagnosis Date  . Vaginal Pap smear, abnormal     Past Surgical History:  Procedure Laterality Date  . COLPOSCOPY  2013-2014    Family History  Problem Relation Age of Onset  . Hypertension Mother   .  Stroke Mother        Crist Infantetia  . Hypertension Father   . Cancer Maternal Grandfather   . Cancer Paternal Grandmother        ovarian  . Diabetes Paternal Grandmother     Social History   Tobacco Use  . Smoking status: Never Smoker  . Smokeless tobacco: Never Used  Substance Use Topics  . Alcohol use: No    Comment: Occasionally   . Drug use: No    Allergies:  Allergies  Allergen Reactions  . Adhesive [Tape] Rash    Medications Prior to Admission  Medication Sig Dispense Refill Last Dose  . ibuprofen (ADVIL,MOTRIN) 600 MG tablet Take 1 tablet (600 mg total) by mouth every 6 (six) hours. 30 tablet 0   . Levonorgest-Eth Estrad-Fe Bisg (BALCOLTRA) 0.1-20 MG-MCG(21) TABS Take 1 tablet by mouth every 8 (eight) hours. Take 1 tablet every 8 hours for 8 days. Skip non active pills and start new pack. 28 tablet 2   . metroNIDAZOLE (FLAGYL) 500 MG tablet Take 1 tablet (500 mg total) by mouth 2 (two) times daily. 14 tablet 2   . norethindrone (MICRONOR,CAMILA,ERRIN) 0.35 MG tablet Take 1 tablet (0.35 mg total) by mouth daily. 1 Package 11   . Prenatal Vit-Fe Fumarate-FA (PRENATAL MULTIVITAMIN)  TABS tablet Take 1 tablet by mouth daily at 12 noon.   05/23/2017 at Unknown time  . Probiotic Product (PROBIOTIC-10) CAPS Take by mouth.   05/23/2017 at Unknown time    Review of Systems  Constitutional: Negative for fatigue and fever.  Gastrointestinal: Negative for abdominal distention, abdominal pain, constipation, diarrhea and vomiting.  Genitourinary: Positive for vaginal bleeding. Negative for decreased urine volume, difficulty urinating, dyspareunia, dysuria, flank pain, urgency, vaginal discharge and vaginal pain.  Neurological: Positive for dizziness.  Psychiatric/Behavioral: Negative for confusion.   Physical Exam   Blood pressure 100/79, pulse (!) 55, temperature 98.6 F (37 C), temperature source Oral, resp. rate 17, weight 156 lb 12 oz (71.1 kg), SpO2 100 %, not currently  breastfeeding.  Physical Exam  Nursing note and vitals reviewed. Constitutional: She is oriented to person, place, and time. She appears well-developed and well-nourished.  Cardiovascular: Normal rate, regular rhythm, normal heart sounds and intact distal pulses.  Respiratory: Effort normal.  GI: Soft. She exhibits no distension and no mass. There is no tenderness. There is no rebound and no guarding.  Genitourinary: Vagina normal and uterus normal. Cervix exhibits motion tenderness. Right adnexum displays no mass, no tenderness and no fullness. Left adnexum displays no mass, no tenderness and no fullness.  Musculoskeletal: Normal range of motion.  Neurological: She is alert and oriented to person, place, and time. She has normal reflexes.  Skin: Skin is warm and dry.  Psychiatric: She has a normal mood and affect. Her behavior is normal. Judgment and thought content normal.   Multiple large clots visible on speculum exam. Minor cervical motion tenderness elicited by bimanual exam.  Patient Vitals for the past 24 hrs:  BP Temp Temp src Pulse Resp SpO2 Weight  07/19/17 1640 100/79 98.6 F (37 C) Oral (!) 55 17 100 % 156 lb 12 oz (71.1 kg)    MAU Course  Procedures Pelvic ultrasound MDM Orders Placed This Encounter  Procedures  . Wet prep, genital    Standing Status:   Standing    Number of Occurrences:   1  . US PELVIS (TRANSABDOMINAL ONLY)    Standing Status:   Standing    Number of Occurrences:   1    Order Specific Question:   Symptom/Reason for Exam    Answer:   Excessive postpartum bleeding [161096]  . CBC    Standing Status:   Standing    Number of Occurrences:   1  . Urinalysis, Routine w reflex microscopic    Standing Status:   Standing    Number of Occurrences:   1  . Discharge patient    Order Specific Question:   Discharge disposition    Answer:   01-Home or Self Care [1]    Order Specific Question:   Discharge patient date    Answer:   07/19/2017   Results  for orders placed or performed during the hospital encounter of 07/19/17 (from the past 24 hour(s))  Wet prep, genital     Status: Abnormal   Collection Time: 07/19/17  6:04 PM  Result Value Ref Range   Yeast Wet Prep HPF POC NONE SEEN NONE SEEN   Trich, Wet Prep NONE SEEN NONE SEEN   Clue Cells Wet Prep HPF POC NONE SEEN NONE SEEN   WBC, Wet Prep HPF POC FEW (A) NONE SEEN   Sperm NONE SEEN   Urinalysis, Routine w reflex microscopic     Status: Abnormal   Collection Time: 07/19/17  6:33  PM  Result Value Ref Range   Color, Urine YELLOW YELLOW   APPearance HAZY (A) CLEAR   Specific Gravity, Urine 1.014 1.005 - 1.030   pH 7.0 5.0 - 8.0   Glucose, UA NEGATIVE NEGATIVE mg/dL   Hgb urine dipstick LARGE (A) NEGATIVE   Bilirubin Urine NEGATIVE NEGATIVE   Ketones, ur NEGATIVE NEGATIVE mg/dL   Protein, ur NEGATIVE NEGATIVE mg/dL   Nitrite NEGATIVE NEGATIVE   Leukocytes, UA TRACE (A) NEGATIVE   RBC / HPF >50 (H) 0 - 5 RBC/hpf   WBC, UA 0-5 0 - 5 WBC/hpf   Bacteria, UA NONE SEEN NONE SEEN   Squamous Epithelial / LPF 0-5 0 - 5  CBC     Status: Abnormal   Collection Time: 07/19/17  6:37 PM  Result Value Ref Range   WBC 5.8 4.0 - 10.5 K/uL   RBC 3.65 (L) 3.87 - 5.11 MIL/uL   Hemoglobin 11.1 (L) 12.0 - 15.0 g/dL   HCT 16.1 (L) 09.6 - 04.5 %   MCV 91.0 78.0 - 100.0 fL   MCH 30.4 26.0 - 34.0 pg   MCHC 33.4 30.0 - 36.0 g/dL   RDW 40.9 81.1 - 91.4 %   Platelets 243 150 - 400 K/uL    US Pelvis (transabdominal Only)  Result Date: 07/19/2017 CLINICAL DATA:  Postpartum bleeding. Spontaneous vaginal delivery 05/25/2017 EXAM: TRANSABDOMINAL ULTRASOUND OF PELVIS TECHNIQUE: Transabdominal ultrasound examination of the pelvis was performed including evaluation of the uterus, ovaries, adnexal regions, and pelvic cul-de-sac. COMPARISON:  Ultrasound pelvis 05/17/2017 FINDINGS: Uterus Measurements: 10.1 x 5.9 x 6.6 cm. No fibroids or other mass visualized. Endometrium Thickness: 33 mm. Large amount of  echogenic material within the uterine cavity, some which is hypervascular. This is most consistent with retained products of conception probable residual placenta. Right ovary Measurements: 3.0 x 1.3 x 2.4 cm. Normal appearance/no adnexal mass. Left ovary Measurements: 3.8 x 1.3 x 2.6 cm. Normal appearance/no adnexal mass. Other findings:  No abnormal free fluid. IMPRESSION: Uterus is filled with a large amount of echogenic material some which is hypervascular on Doppler. Most likely retained products of conception. Electronically Signed   By: Marlan Palau M.D.   On: 07/19/2017 18:32     Assessment and Plan  -32 y.o. G39P1001 postpartum female -Retained products of conception identified on pelvic ultrasound -HPI, physical exam and lab results discussed with Dr. Earlene Plater -Patient to have dilation and curettage tomorrow, NPO after midnight -OR will call tomorrow morning for scheduling -Bleeding precautions reviewed with patient. Patient's boyfriend/FOB present at home to assist with care tonight and recovery tomorrow -Discharged home in stable condition.   Calvert Cantor, CNM 07/19/2017, 6:57 PM

## 2017-07-19 NOTE — Discharge Instructions (Signed)
Dilation and Curettage or Vacuum Curettage Dilation and curettage (D&C) and vacuum curettage are minor procedures. A D&C involves stretching (dilation) the cervix and scraping (curettage) the inside lining of the uterus (endometrium). During a D&C, tissue is gently scraped from the endometrium, starting from the top portion of the uterus down to the lowest part of the uterus (cervix). During a vacuum curettage, the lining and tissue in the uterus are removed with the use of gentle suction. Curettage may be performed to either diagnose or treat a problem. As a diagnostic procedure, curettage is performed to examine tissues from the uterus. A diagnostic curettage may be done if you have:  Irregular bleeding in the uterus.  Bleeding with the development of clots.  Spotting between menstrual periods.  Prolonged menstrual periods or other abnormal bleeding.  Bleeding after menopause.  No menstrual period (amenorrhea).  A change in size and shape of the uterus.  Abnormal endometrial cells discovered during a Pap test.  As a treatment procedure, curettage may be performed for the following reasons:  Removal of an IUD (intrauterine device).  Removal of retained placenta after giving birth.  Abortion.  Miscarriage.  Removal of endometrial polyps.  Removal of uncommon types of noncancerous lumps (fibroids).  Tell a health care provider about:  Any allergies you have, including allergies to prescribed medicine or latex.  All medicines you are taking, including vitamins, herbs, eye drops, creams, and over-the-counter medicines. This is especially important if you take any blood-thinning medicine. Bring a list of all of your medicines to your appointment.  Any problems you or family members have had with anesthetic medicines.  Any blood disorders you have.  Any surgeries you have had.  Your medical history and any medical conditions you have.  Whether you are pregnant or may be  pregnant.  Recent vaginal infections you have had.  Recent menstrual periods, bleeding problems you have had, and what form of birth control (contraception) you use. What are the risks? Generally, this is a safe procedure. However, problems may occur, including:  Infection.  Heavy vaginal bleeding.  Allergic reactions to medicines.  Damage to the cervix or other structures or organs.  Development of scar tissue (adhesions) inside the uterus, which can cause abnormal amounts of menstrual bleeding. This may make it harder to get pregnant in the future.  A hole (perforation) or puncture in the uterine wall. This is rare.  What happens before the procedure? Staying hydrated Follow instructions from your health care provider about hydration, which may include:  Up to 2 hours before the procedure - you may continue to drink clear liquids, such as water, clear fruit juice, black coffee, and plain tea.  Eating and drinking restrictions Follow instructions from your health care provider about eating and drinking, which may include:  8 hours before the procedure - stop eating heavy meals or foods such as meat, fried foods, or fatty foods.  6 hours before the procedure - stop eating light meals or foods, such as toast or cereal.  6 hours before the procedure - stop drinking milk or drinks that contain milk.  2 hours before the procedure - stop drinking clear liquids. If your health care provider told you to take your medicine(s) on the day of your procedure, take them with only a sip of water.  Medicines  Ask your health care provider about: ? Changing or stopping your regular medicines. This is especially important if you are taking diabetes medicines or blood thinners. ? Taking   medicines such as aspirin and ibuprofen. These medicines can thin your blood. Do not take these medicines before your procedure if your health care provider instructs you not to.  You may be given antibiotic  medicine to help prevent infection. General instructions  For 24 hours before your procedure, do not: ? Douche. ? Use tampons. ? Use medicines, creams, or suppositories in the vagina. ? Have sexual intercourse.  You may be given a pregnancy test on the day of the procedure.  Plan to have someone take you home from the hospital or clinic.  You may have a blood or urine sample taken.  If you will be going home right after the procedure, plan to have someone with you for 24 hours. What happens during the procedure?  To reduce your risk of infection: ? Your health care team will wash or sanitize their hands. ? Your skin will be washed with soap.  An IV tube will be inserted into one of your veins.  You will be given one of the following: ? A medicine that numbs the area in and around the cervix (local anesthetic). ? A medicine to make you fall asleep (general anesthetic).  You will lie down on your back, with your feet in foot rests (stirrups).  The size and position of your uterus will be checked.  A lubricated instrument (speculum or Sims retractor) will be inserted into the back side of your vagina. The speculum will be used to hold apart the walls of your vagina so your health care provider can see your cervix.  A tool (tenaculum) will be attached to the lip of the cervix to stabilize it.  Your cervix will be softened and dilated. This may be done by: ? Taking a medicine. ? Having tapered dilators or thin rods (laminaria) or gradual widening instruments (tapered dilators) inserted into your cervix.  A small, sharp, curved instrument (curette) will be used to scrape a small amount of tissue or cells from the endometrium or cervical canal. In some cases, gentle suction is applied with the curette. The curette will then be removed. The cells will be taken to a lab for testing. The procedure may vary among health care providers and hospitals. What happens after the  procedure?  You may have mild cramping, backache, pain, and light bleeding or spotting. You may pass small blood clots from your vagina.  You may have to wear compression stockings. These stockings help to prevent blood clots and reduce swelling in your legs.  Your blood pressure, heart rate, breathing rate, and blood oxygen level will be monitored until the medicines you were given have worn off. Summary  Dilation and curettage (D&C) involves stretching (dilation) the cervix and scraping (curettage) the inside lining of the uterus (endometrium).  After the procedure, you may have mild cramping, backache, pain, and light bleeding or spotting. You may pass small blood clots from your vagina.  Plan to have someone take you home from the hospital or clinic. This information is not intended to replace advice given to you by your health care provider. Make sure you discuss any questions you have with your health care provider. Document Released: 01/16/2005 Document Revised: 10/03/2015 Document Reviewed: 10/03/2015 Elsevier Interactive Patient Education  2018 [employer]  

## 2017-07-20 ENCOUNTER — Ambulatory Visit (HOSPITAL_COMMUNITY): Payer: BLUE CROSS/BLUE SHIELD | Admitting: Certified Registered Nurse Anesthetist

## 2017-07-20 ENCOUNTER — Encounter: Payer: Self-pay | Admitting: Obstetrics & Gynecology

## 2017-07-20 ENCOUNTER — Ambulatory Visit (HOSPITAL_COMMUNITY)
Admission: AD | Admit: 2017-07-20 | Discharge: 2017-07-20 | Disposition: A | Discharge status: Home / Self Care | Payer: BLUE CROSS/BLUE SHIELD | Source: Ambulatory Visit | Attending: Obstetrics & Gynecology | Admitting: Obstetrics & Gynecology

## 2017-07-20 ENCOUNTER — Encounter (HOSPITAL_COMMUNITY)
Admission: AD | Disposition: A | Discharge status: Home / Self Care | Payer: Self-pay | Source: Ambulatory Visit | Attending: Obstetrics & Gynecology

## 2017-07-20 ENCOUNTER — Other Ambulatory Visit: Payer: Self-pay

## 2017-07-20 DIAGNOSIS — Z79899 Other long term (current) drug therapy: Secondary | ICD-10-CM | POA: Diagnosis not present

## 2017-07-20 DIAGNOSIS — Z791 Long term (current) use of non-steroidal anti-inflammatories (NSAID): Secondary | ICD-10-CM | POA: Insufficient documentation

## 2017-07-20 DIAGNOSIS — Z793 Long term (current) use of hormonal contraceptives: Secondary | ICD-10-CM | POA: Insufficient documentation

## 2017-07-20 DIAGNOSIS — O034 Incomplete spontaneous abortion without complication: Secondary | ICD-10-CM | POA: Diagnosis not present

## 2017-07-20 HISTORY — PX: DILATION AND EVACUATION: SHX1459

## 2017-07-20 HISTORY — DX: Unspecified abnormal cytological findings in specimens from cervix uteri: R87.619

## 2017-07-20 LAB — HCG, QUANTITATIVE, PREGNANCY: HCG, BETA CHAIN, QUANT, S: 20 m[IU]/mL — AB (ref <5)

## 2017-07-20 SURGERY — DILATION AND EVACUATION, UTERUS
Anesthesia: Monitor Anesthesia Care

## 2017-07-20 MED ORDER — DEXAMETHASONE SODIUM PHOSPHATE 4 MG/ML IJ SOLN
INTRAMUSCULAR | Status: AC
  Filled 2017-07-20: qty 1

## 2017-07-20 MED ORDER — FENTANYL CITRATE (PF) 100 MCG/2ML IJ SOLN
INTRAMUSCULAR | Status: AC
  Filled 2017-07-20: qty 2

## 2017-07-20 MED ORDER — PROPOFOL 10 MG/ML IV BOLUS
INTRAVENOUS | Status: AC
  Filled 2017-07-20: qty 20

## 2017-07-20 MED ORDER — MIDAZOLAM HCL 2 MG/2ML IJ SOLN
INTRAMUSCULAR | Status: AC
  Filled 2017-07-20: qty 2

## 2017-07-20 MED ORDER — KETOROLAC TROMETHAMINE 30 MG/ML IJ SOLN
INTRAMUSCULAR | Status: AC
  Filled 2017-07-20: qty 1

## 2017-07-20 MED ORDER — BUPIVACAINE HCL 0.5 % IJ SOLN
INTRAMUSCULAR | Status: DC | PRN
  Administered 2017-07-20: 30 mL

## 2017-07-20 MED ORDER — ONDANSETRON HCL 4 MG/2ML IJ SOLN
INTRAMUSCULAR | Status: AC
  Filled 2017-07-20: qty 2

## 2017-07-20 MED ORDER — 0.9 % SODIUM CHLORIDE (POUR BTL) OPTIME
TOPICAL | Status: DC | PRN
  Administered 2017-07-20: 1000 mL

## 2017-07-20 MED ORDER — FENTANYL CITRATE (PF) 100 MCG/2ML IJ SOLN
INTRAMUSCULAR | Status: DC | PRN
  Administered 2017-07-20 (×2): 50 ug via INTRAVENOUS

## 2017-07-20 MED ORDER — PROPOFOL 500 MG/50ML IV EMUL
INTRAVENOUS | Status: DC | PRN
  Administered 2017-07-20 (×4): 50 mg via INTRAVENOUS

## 2017-07-20 MED ORDER — DOXYCYCLINE HYCLATE 100 MG IV SOLR
200.0000 mg | INTRAVENOUS | Status: AC
  Administered 2017-07-20: 200 mg via INTRAVENOUS
  Filled 2017-07-20: qty 200

## 2017-07-20 MED ORDER — GLYCOPYRROLATE 0.2 MG/ML IJ SOLN
INTRAMUSCULAR | Status: DC | PRN
  Administered 2017-07-20: 0.2 mg via INTRAVENOUS

## 2017-07-20 MED ORDER — OXYCODONE HCL 5 MG PO TABS
5.0000 mg | ORAL_TABLET | Freq: Once | ORAL | Status: DC | PRN
Start: 2017-07-20 — End: 2017-07-20

## 2017-07-20 MED ORDER — DOCUSATE SODIUM 100 MG PO CAPS: 100.0000 mg | ORAL_CAPSULE | Freq: Two times a day (BID) | ORAL | 2 refills | Status: DC | PRN

## 2017-07-20 MED ORDER — BUPIVACAINE HCL (PF) 0.5 % IJ SOLN
INTRAMUSCULAR | Status: AC
  Filled 2017-07-20: qty 30

## 2017-07-20 MED ORDER — LACTATED RINGERS IV SOLN
INTRAVENOUS | Status: DC
  Administered 2017-07-20: 12:00:00 via INTRAVENOUS

## 2017-07-20 MED ORDER — ONDANSETRON HCL 4 MG/2ML IJ SOLN
INTRAMUSCULAR | Status: DC | PRN
  Administered 2017-07-20: 4 mg via INTRAVENOUS

## 2017-07-20 MED ORDER — MIDAZOLAM HCL 2 MG/2ML IJ SOLN
INTRAMUSCULAR | Status: DC | PRN
  Administered 2017-07-20: 2 mg via INTRAVENOUS

## 2017-07-20 MED ORDER — OXYCODONE-ACETAMINOPHEN 5-325 MG PO TABS: 1.0000 | ORAL_TABLET | ORAL | 0 refills | Status: DC | PRN

## 2017-07-20 MED ORDER — IBUPROFEN 600 MG PO TABS: 600.0000 mg | ORAL_TABLET | Freq: Four times a day (QID) | ORAL | 2 refills | Status: DC | PRN

## 2017-07-20 MED ORDER — LIDOCAINE HCL (CARDIAC) PF 100 MG/5ML IV SOSY
PREFILLED_SYRINGE | INTRAVENOUS | Status: DC | PRN
  Administered 2017-07-20: 50 mg via INTRAVENOUS

## 2017-07-20 MED ORDER — KETOROLAC TROMETHAMINE 30 MG/ML IJ SOLN: 30.0000 mg | Freq: Once | INTRAMUSCULAR | Status: DC | PRN

## 2017-07-20 MED ORDER — DEXAMETHASONE SODIUM PHOSPHATE 10 MG/ML IJ SOLN
INTRAMUSCULAR | Status: DC | PRN
  Administered 2017-07-20: 4 mg via INTRAVENOUS

## 2017-07-20 MED ORDER — GLYCOPYRROLATE 0.2 MG/ML IJ SOLN
INTRAMUSCULAR | Status: AC
  Filled 2017-07-20: qty 1

## 2017-07-20 MED ORDER — PROMETHAZINE HCL 25 MG/ML IJ SOLN: 6.2500 mg | INTRAMUSCULAR | Status: DC | PRN

## 2017-07-20 MED ORDER — FENTANYL CITRATE (PF) 100 MCG/2ML IJ SOLN: 25.0000 ug | INTRAMUSCULAR | Status: DC | PRN

## 2017-07-20 MED ORDER — MEPERIDINE HCL 25 MG/ML IJ SOLN: 6.2500 mg | INTRAMUSCULAR | Status: DC | PRN

## 2017-07-20 MED ORDER — KETOROLAC TROMETHAMINE 30 MG/ML IJ SOLN
INTRAMUSCULAR | Status: DC | PRN
  Administered 2017-07-20: 30 mg via INTRAMUSCULAR
  Administered 2017-07-20: 30 mg via INTRAVENOUS

## 2017-07-20 MED ORDER — OXYCODONE HCL 5 MG/5ML PO SOLN: 5.0000 mg | Freq: Once | ORAL | Status: DC | PRN

## 2017-07-20 SURGICAL SUPPLY — 18 items
CATH ROBINSON RED A/P 16FR (CATHETERS) ×2 IMPLANT
DECANTER SPIKE VIAL GLASS SM (MISCELLANEOUS) ×2 IMPLANT
GLOVE BIOGEL PI IND STRL 7.0 (GLOVE) ×1 IMPLANT
GLOVE BIOGEL PI INDICATOR 7.0 (GLOVE) ×1
GLOVE ECLIPSE 7.0 STRL STRAW (GLOVE) ×2 IMPLANT
GOWN STRL REUS W/TWL LRG LVL3 (GOWN DISPOSABLE) ×4 IMPLANT
KIT BERKELEY 1ST TRIMESTER 3/8 (MISCELLANEOUS) ×2 IMPLANT
NS IRRIG 1000ML POUR BTL (IV SOLUTION) ×2 IMPLANT
PACK VAGINAL MINOR WOMEN LF (CUSTOM PROCEDURE TRAY) ×2 IMPLANT
PAD OB MATERNITY 4.3X12.25 (PERSONAL CARE ITEMS) ×2 IMPLANT
PAD PREP 24X48 CUFFED NSTRL (MISCELLANEOUS) ×2 IMPLANT
SET BERKELEY SUCTION TUBING (SUCTIONS) ×2 IMPLANT
TOWEL OR 17X24 6PK STRL BLUE (TOWEL DISPOSABLE) ×4 IMPLANT
VACURETTE 10 RIGID CVD (CANNULA) IMPLANT
VACURETTE 6 ASPIR F TIP BERK (CANNULA) IMPLANT
VACURETTE 7MM CVD STRL WRAP (CANNULA) IMPLANT
VACURETTE 8 RIGID CVD (CANNULA) ×2 IMPLANT
VACURETTE 9 RIGID CVD (CANNULA) IMPLANT

## 2017-07-20 NOTE — Transfer of Care (Signed)
Immediate Anesthesia Transfer of Care Note  Patient: Kristi [name]  Procedure(s) Performed: DILATATION AND EVACUATION (N/A )  Patient Location: PACU  Anesthesia Type:MAC  Level of Consciousness: awake, alert  and oriented  Airway & Oxygen Therapy: Patient Spontanous Breathing and Patient connected to nasal cannula oxygen  Post-op Assessment: Report given to RN and Post -op Vital signs reviewed and stable  Post vital signs: Reviewed and stable  Last Vitals:  Vitals Value Taken Time  BP    Temp    Pulse 88 07/20/2017 12:28 PM  Resp 13 07/20/2017 12:28 PM  SpO2 100 % 07/20/2017 12:28 PM  Vitals shown include unvalidated device data.  Last Pain:  Vitals:   07/20/17 1100  TempSrc: Oral  PainSc: 0-No pain      Patients Stated Pain Goal: 4 (01/65/53 7482)  Complications: No apparent anesthesia complications

## 2017-07-20 NOTE — Anesthesia Preprocedure Evaluation (Signed)
Anesthesia Evaluation  Patient identified by MRN, date of birth, ID band Patient awake    Reviewed: Allergy & Precautions, NPO status , Patient's Chart, lab work & pertinent test results  Airway Mallampati: II  TM Distance: >3 FB Neck ROM: Full    Dental no notable dental hx.    Pulmonary neg pulmonary ROS,    Pulmonary exam normal breath sounds clear to auscultation       Cardiovascular negative cardio ROS Normal cardiovascular exam Rhythm:Regular Rate:Normal     Neuro/Psych negative neurological ROS  negative psych ROS   GI/Hepatic negative GI ROS, Neg liver ROS,   Endo/Other  negative endocrine ROS  Renal/GU negative Renal ROS     Musculoskeletal negative musculoskeletal ROS (+)   Abdominal   Peds  Hematology negative hematology ROS (+)   Anesthesia Other Findings   Reproductive/Obstetrics negative OB ROS                             Anesthesia Physical  Anesthesia Plan  ASA: II  Anesthesia Plan: MAC   Post-op Pain Management:    Induction:   PONV Risk Score and Plan: 4 or greater and Ondansetron, Dexamethasone, Midazolam, Scopolamine patch - Pre-op and Treatment may vary due to age or medical condition  Airway Management Planned:   Additional Equipment:   Intra-op Plan:   Post-operative Plan: Extubation in OR  Informed Consent: I have reviewed the patients History and Physical, chart, labs and discussed the procedure including the risks, benefits and alternatives for the proposed anesthesia with the patient or authorized representative who has indicated his/her understanding and acceptance.   Dental advisory given  Plan Discussed with: CRNA  Anesthesia Plan Comments:        Anesthesia Quick Evaluation

## 2017-07-20 NOTE — Op Note (Signed)
Kristi [name] PROCEDURE DATE: 07/20/2017  PREOPERATIVE DIAGNOSIS: Retained products of conception following delivery with hemorrhage POSTOPERATIVE DIAGNOSIS: The same PROCEDURE:   Dilation and Evacuation SURGEON:  Dr. Jaynie CollinsUgonna Welcome Fults  INDICATIONS: 32 y.o. G1P1001 with retained products of conception following delivery with hemorrhage, needing surgical completion.  Risks of surgery were discussed with the patient including but not limited to: bleeding which may require transfusion; infection which may require antibiotics; injury to uterus or surrounding organs; need for additional procedures including laparotomy or laparoscopy; possibility of intrauterine scarring which may impair future fertility; and other postoperative/anesthesia complications. Written informed consent was obtained.    FINDINGS:  A 12 week size uterus, moderate amounts of products of conception, specimen sent to pathology.  ANESTHESIA:    Monitored intravenous sedation, paracervical block. ESTIMATED BLOOD LOSS: 20 ml. SPECIMENS:  Products of conception sent to pathology COMPLICATIONS:  None immediate.  PROCEDURE DETAILS:  The patient received intravenous Doxycycline while in the preoperative area.  She was then taken to the operating room where monitored intravenous sedation was administered and was found to be adequate.  After an adequate timeout was performed, she was placed in the dorsal lithotomy position and examined; then prepped and draped in the sterile manner.   Her bladder was catheterized for an unmeasured amount of clear, yellow urine. A vaginal speculum was then placed in the patient's vagina and a single tooth tenaculum was applied to the anterior lip of the cervix.  A paracervical block using 30 ml of 0.5% Marcaine was administered. The cervix was gently dilated to accommodate a 8 mm suction curette that was gently advanced to the uterine fundus.  The suction device was then activated and curette slowly rotated to  clear the uterus of products of conception.  A gentle sharp curettage was then performed to confirm complete emptying of the uterus. There was minimal bleeding noted and the tenaculum removed with good hemostasis noted.   All instruments were removed from the patient's vagina.  Sponge and instrument counts were correct times two  The patient tolerated the procedure well and was taken to the recovery area awake, and in stable condition.  The patient will be discharged to home as per PACU criteria.  Routine postoperative instructions given.  She was prescribed Percocet, Ibuprofen and Colace.  She will follow up in the office for postoperative evaluation.   Jaynie CollinsUGONNA  Radley Barto, MD, FACOG Obstetrician & Gynecologist, [facility], [facility]

## 2017-07-20 NOTE — H&P (Signed)
Preoperative History and Physical  Kristi [name] is a 32 y.o. G1P1001 here for surgical management of retained products of conception, following delivery with hemorrhage. Had SVD on 05/25/17, continued bleeding since then. Was seen in [facility] yesterday, ultrasound showed RPOCs. Patient declined misoprostol treatment, wanted Dilation and Evacuation (D&E which was scheduled today.   No significant preoperative concerns.  Proposed surgery: Dilation and Evacuation  Past Medical History:  Diagnosis Date  . Pap smear abnormality of cervix    Past Surgical History:  Procedure Laterality Date  . COLPOSCOPY  2013-2014  . SIGMOIDOSCOPY     OB History  Gravida Para Term Preterm AB Living  1 1 1     1   SAB TAB Ectopic Multiple Live Births        0 1    # Outcome Date GA Lbr Len/2nd Weight Sex Delivery Anes PTL Lv  1 Term 05/25/17 [redacted]w[redacted]d 19:05 / 03:45 7 lb 13.6 oz (3.56 kg) M Vag-Spont EPI  LIV     Birth Comments: WNL     Complications: Retained products of conception, following delivery with hemorrhage    Obstetric Comments  D&E on 07/20/17 (8 weeks PP) for RPOCs  Patient denies any other pertinent gynecologic issues.   No current facility-administered medications on file prior to encounter.    Current Outpatient Medications on File Prior to Encounter  Medication Sig Dispense Refill  . ibuprofen (ADVIL,MOTRIN) 600 MG tablet Take 1 tablet (600 mg total) by mouth every 6 (six) hours. 30 tablet 0  . Levonorgest-Eth Estrad-Fe Bisg (BALCOLTRA) 0.1-20 MG-MCG(21) TABS Take 1 tablet by mouth every 8 (eight) hours. Take 1 tablet every 8 hours for 8 days. Skip non active pills and start new pack. 28 tablet 2  . norethindrone (MICRONOR,CAMILA,ERRIN) 0.35 MG tablet Take 1 tablet (0.35 mg total) by mouth daily. 1 Package 11  . Prenatal Vit-Fe Fumarate-FA (PRENATAL MULTIVITAMIN) TABS tablet Take 1 tablet by mouth daily at 12 noon.    . Probiotic Product (PROBIOTIC-10) CAPS Take by mouth.    . metroNIDAZOLE  (FLAGYL) 500 MG tablet Take 1 tablet (500 mg total) by mouth 2 (two) times daily. 14 tablet 2   Allergies  Allergen Reactions  . Adhesive [Tape] Rash    Social History:   reports that she has never smoked. She has never used smokeless tobacco. She reports that she drinks alcohol. She reports that she does not use drugs.  Family History  Problem Relation Age of Onset  . Hypertension Mother   . Stroke Mother        Crist Infante  . Hypertension Father   . Cancer Maternal Grandfather   . Cancer Paternal Grandmother        ovarian  . Diabetes Paternal Grandmother     Review of Systems: Pertinent items noted in HPI and remainder of comprehensive ROS otherwise negative.  PHYSICAL EXAM: Blood pressure 113/78, pulse 81, temperature 98.6 F (37 C), temperature source Oral, resp. rate 18, height 5\' 5"  (1.651 m), weight 156 lb (70.8 kg), SpO2 99 %, not currently breastfeeding. CONSTITUTIONAL: Well-developed, well-nourished female in no acute distress.  HENT:  Normocephalic, atraumatic, External right and left ear normal. Oropharynx is clear and moist EYES: Conjunctivae and EOM are normal. Pupils are equal, round, and reactive to light. No scleral icterus.  NECK: Normal range of motion, supple, no masses SKIN: Skin is warm and dry. No rash noted. Not diaphoretic. No erythema. No pallor. NEUROLOGIC: Alert and oriented to person, place, and time. Normal  reflexes, muscle tone coordination. No cranial nerve deficit noted. PSYCHIATRIC: Normal mood and affect. Normal behavior. Normal judgment and thought content. CARDIOVASCULAR: Normal heart rate noted, regular rhythm RESPIRATORY: Effort and breath sounds normal, no problems with respiration noted ABDOMEN: Soft, nontender, nondistended. PELVIC: Deferred MUSCULOSKELETAL: Normal range of motion. No edema and no tenderness. 2+ distal pulses.  Labs: Results for orders placed or performed during the hospital encounter of 07/19/17 (from the past 336 hour(s))   Wet prep, genital   Collection Time: 07/19/17  6:04 PM  Result Value Ref Range   Yeast Wet Prep HPF POC NONE SEEN NONE SEEN   Trich, Wet Prep NONE SEEN NONE SEEN   Clue Cells Wet Prep HPF POC NONE SEEN NONE SEEN   WBC, Wet Prep HPF POC FEW (A) NONE SEEN   Sperm NONE SEEN   Urinalysis, Routine w reflex microscopic   Collection Time: 07/19/17  6:33 PM  Result Value Ref Range   Color, Urine YELLOW YELLOW   APPearance HAZY (A) CLEAR   Specific Gravity, Urine 1.014 1.005 - 1.030   pH 7.0 5.0 - 8.0   Glucose, UA NEGATIVE NEGATIVE mg/dL   Hgb urine dipstick LARGE (A) NEGATIVE   Bilirubin Urine NEGATIVE NEGATIVE   Ketones, ur NEGATIVE NEGATIVE mg/dL   Protein, ur NEGATIVE NEGATIVE mg/dL   Nitrite NEGATIVE NEGATIVE   Leukocytes, UA TRACE (A) NEGATIVE   RBC / HPF >50 (H) 0 - 5 RBC/hpf   WBC, UA 0-5 0 - 5 WBC/hpf   Bacteria, UA NONE SEEN NONE SEEN   Squamous Epithelial / LPF 0-5 0 - 5  CBC   Collection Time: 07/19/17  6:37 PM  Result Value Ref Range   WBC 5.8 4.0 - 10.5 K/uL   RBC 3.65 (L) 3.87 - 5.11 MIL/uL   Hemoglobin 11.1 (L) 12.0 - 15.0 g/dL   HCT 21.333.2 (L) 08.636.0 - 57.846.0 %   MCV 91.0 78.0 - 100.0 fL   MCH 30.4 26.0 - 34.0 pg   MCHC 33.4 30.0 - 36.0 g/dL   RDW 46.912.9 62.911.5 - 52.815.5 %   Platelets 243 150 - 400 K/uL    Imaging Studies: [place] Pelvis (transabdominal Only)  Result Date: 07/19/2017 CLINICAL DATA:  Postpartum bleeding. Spontaneous vaginal delivery 05/25/2017 EXAM: TRANSABDOMINAL ULTRASOUND OF PELVIS TECHNIQUE: Transabdominal ultrasound examination of the pelvis was performed including evaluation of the uterus, ovaries, adnexal regions, and pelvic cul-de-sac. COMPARISON:  Ultrasound pelvis 05/17/2017 FINDINGS: Uterus Measurements: 10.1 x 5.9 x 6.6 cm. No fibroids or other mass visualized. Endometrium Thickness: 33 mm. Large amount of echogenic material within the uterine cavity, some which is hypervascular. This is most consistent with retained products of conception probable  residual placenta. Right ovary Measurements: 3.0 x 1.3 x 2.4 cm. Normal appearance/no adnexal mass. Left ovary Measurements: 3.8 x 1.3 x 2.6 cm. Normal appearance/no adnexal mass. Other findings:  No abnormal free fluid. IMPRESSION: Uterus is filled with a large amount of echogenic material some which is hypervascular on Doppler. Most likely retained products of conception. Electronically Signed   By: Marlan Palauharles  Clark M.D.   On: 07/19/2017 18:32    Assessment: Patient Active Problem List   Diagnosis Date Noted  . Retained products of conception, following delivery with hemorrhage 07/20/2017    Plan: Patient will undergo surgical management with Dilation and Evacuation (D&E).   The risks of surgery were discussed in detail with the patient including but not limited UX:[id number]:Risks of surgery were discussed with the patient including but not limited  to: bleeding which may require transfusion; infection which may require antibiotics; injury to uterus or surrounding organs; need for additional procedures including laparotomy or laparoscopy; possibility of intrauterine scarring which may impair future fertility; and other postoperative/anesthesia complications. Likelihood of success in alleviating the patient's condition was discussed. Routine postoperative instructions will be reviewed with the patient and her family in detail after surgery.  The patient concurred with the proposed plan, giving informed written consent for the surgery.  Patient has been NPO since last night and she will remain NPO for procedure.  Anesthesia and OR aware.  Preoperative prophylactic antibiotics and SCDs ordered on call to the OR.  To OR when ready.    Jaynie Collins, MD, FACOG Obstetrician & Gynecologist, [facility] for [employer], [facility]

## 2017-07-20 NOTE — Discharge Instructions (Signed)
Dilation and Curettage, Care After These instructions give you information about caring for yourself after your procedure. Your doctor may also give you more specific instructions. Call your doctor if you have any problems or questions after your procedure. Follow these instructions at home: **You may begin taking Ibuprofen after 6:14 pm today Activity  Do not drive or use heavy machinery while taking prescription pain medicine.  For 24 hours after your procedure, avoid driving.  Take short walks often, followed by rest periods. Ask your doctor what activities are safe for you. After one or two days, you may be able to return to your normal activities.  Do not lift anything that is heavier than 10 lb (4.5 kg) until your doctor approves.  For at least 2 weeks, or as long as told by your doctor: ? Do not douche. ? Do not use tampons. ? Do not have sex. General instructions  Take over-the-counter and prescription medicines only as told by your doctor. This is very important if you take blood thinning medicine.  Do not take baths, swim, or use a hot tub until your doctor approves. Take showers instead of baths.  Wear compression stockings as told by your doctor.  It is up to you to get the results of your procedure. Ask your doctor when your results will be ready.  Keep all follow-up visits as told by your doctor. This is important. Contact a doctor if:  You have very bad cramps that get worse or do not get better with medicine.  You have very bad pain in your belly (abdomen).  You cannot drink fluids without throwing up (vomiting).  You get pain in a different part of the area between your belly and thighs (pelvis).  You have bad-smelling discharge from your vagina.  You have a rash. Get help right away if:  You are bleeding a lot from your vagina. A lot of bleeding means soaking more than one sanitary pad in an hour, for 2 hours in a row.  You have clumps of blood (blood  clots) coming from your vagina.  You have a fever or chills.  Your belly feels very tender or hard.  You have chest pain.  You have trouble breathing.  You cough up blood.  You feel dizzy.  You feel light-headed.  You pass out (faint).  You have pain in your neck or shoulder area. Summary  Take short walks often, followed by rest periods. Ask your doctor what activities are safe for you. After one or two days, you may be able to return to your normal activities.  Do not lift anything that is heavier than 10 lb (4.5 kg) until your doctor approves.  Do not take baths, swim, or use a hot tub until your doctor approves. Take showers instead of baths.  Contact your doctor if you have any symptoms of infection, like bad-smelling discharge from your vagina. This information is not intended to replace advice given to you by your health care provider. Make sure you discuss any questions you have with your health care provider. Patient signature_______________________________  Nurse signature_______________________________  Support person's signature___________________________

## 2017-07-21 NOTE — Anesthesia Postprocedure Evaluation (Signed)
Anesthesia Post Note  Patient: Kristi [name]  Procedure(s) Performed: DILATATION AND EVACUATION (N/A )     Patient location during evaluation: PACU Anesthesia Type: MAC Level of consciousness: awake and alert Pain management: pain level controlled Vital Signs Assessment: post-procedure vital signs reviewed and stable Respiratory status: spontaneous breathing Cardiovascular status: stable Anesthetic complications: no    Last Vitals:  Vitals:   07/20/17 1245 07/20/17 1315  BP: 121/79 118/72  Pulse: 65 64  Resp: 11 14  Temp:  (!) 36.4 C  SpO2: 100% 100%    Last Pain:  Vitals:   07/20/17 1315  TempSrc:   PainSc: 0-No pain   Pain Goal: Patients Stated Pain Goal: 4 (07/20/17 1100)               Nolon Nations

## 2017-07-22 ENCOUNTER — Encounter (HOSPITAL_COMMUNITY): Payer: Self-pay | Admitting: Obstetrics & Gynecology

## 2017-07-23 ENCOUNTER — Encounter: Payer: Self-pay | Admitting: Obstetrics

## 2017-07-23 ENCOUNTER — Encounter: Payer: Self-pay | Admitting: *Deleted

## 2017-07-23 ENCOUNTER — Ambulatory Visit (INDEPENDENT_AMBULATORY_CARE_PROVIDER_SITE_OTHER): Payer: BLUE CROSS/BLUE SHIELD | Admitting: Obstetrics

## 2017-07-23 DIAGNOSIS — L929 Granulomatous disorder of the skin and subcutaneous tissue, unspecified: Secondary | ICD-10-CM

## 2017-07-23 DIAGNOSIS — Z9889 Other specified postprocedural states: Secondary | ICD-10-CM | POA: Diagnosis not present

## 2017-07-23 DIAGNOSIS — K429 Umbilical hernia without obstruction or gangrene: Secondary | ICD-10-CM

## 2017-07-23 DIAGNOSIS — Z3041 Encounter for surveillance of contraceptive pills: Secondary | ICD-10-CM

## 2017-07-23 LAB — GC/CHLAMYDIA PROBE AMP (~~LOC~~) NOT AT ARMC
CHLAMYDIA, DNA PROBE: NEGATIVE
Neisseria Gonorrhea: NEGATIVE

## 2017-07-23 MED ORDER — LEVONORGESTREL-ETHINYL ESTRAD 0.1-20 MG-MCG PO TABS: 1.0000 | ORAL_TABLET | Freq: Every day | ORAL | 11 refills | Status: DC

## 2017-07-23 MED ORDER — LEVONORGEST-ETH ESTRADIOL-IRON 0.1-20 MG-MCG(21) PO TABS
1.0000 | ORAL_TABLET | Freq: Three times a day (TID) | ORAL | 2 refills | Status: DC
Start: 2017-07-23 — End: 2017-07-23

## 2017-07-23 NOTE — Progress Notes (Signed)
Patient ID: Kristi [name], female   DOB: 04/07/1985, 32 y.o.   MRN: [id number]  Chief Complaint  Patient presents with  . Follow-up    HPI Kristi Shileyiffany Renard HamperHyman is a 32 y.o. female.  Perineum still tender from laceration during delivery.  HPI  Past Medical History:  Diagnosis Date  . Pap smear abnormality of cervix     Past Surgical History:  Procedure Laterality Date  . COLPOSCOPY  2013-2014  . DILATION AND EVACUATION N/A 07/20/2017   Procedure: DILATATION AND EVACUATION;  Surgeon: Tereso NewcomerAnyanwu, Ugonna A, MD;  Location: [facility];  Service: Gynecology;  Laterality: N/A;  . SIGMOIDOSCOPY      Family History  Problem Relation Age of Onset  . Hypertension Mother   . Stroke Mother        Crist Infantetia  . Hypertension Father   . Cancer Maternal Grandfather   . Cancer Paternal Grandmother        ovarian  . Diabetes Paternal Grandmother     Social History Social History   Tobacco Use  . Smoking status: Never Smoker  . Smokeless tobacco: Never Used  Substance Use Topics  . Alcohol use: Yes    Comment: Occasionally   . Drug use: No    Allergies  Allergen Reactions  . Adhesive [Tape] Rash    Current Outpatient Medications  Medication Sig Dispense Refill  . docusate sodium (COLACE) 100 MG capsule Take 1 capsule (100 mg total) by mouth 2 (two) times daily as needed for mild constipation or moderate constipation. 30 capsule 2  . ibuprofen (ADVIL,MOTRIN) 600 MG tablet Take 1 tablet (600 mg total) by mouth every 6 (six) hours as needed for moderate pain or cramping. 30 tablet 2  . Levonorgest-Eth Estrad-Fe Bisg (BALCOLTRA) 0.1-20 MG-MCG(21) TABS Take 1 tablet by mouth every 8 (eight) hours. Take 1 tablet every 8 hours for 8 days. Skip non active pills and start new pack. 28 tablet 2  . Prenatal Vit-Fe Fumarate-FA (PRENATAL MULTIVITAMIN) TABS tablet Take 1 tablet by mouth daily at 12 noon.    . Probiotic Product (PROBIOTIC-10) CAPS Take by mouth.     No current facility-administered medications for  this visit.     Review of Systems Review of Systems Constitutional: negative for fatigue and weight loss Respiratory: negative for cough and wheezing Cardiovascular: negative for chest pain, fatigue and palpitations Gastrointestinal: negative for abdominal pain and change in bowel habits Genitourinary:positive for tender area from perineal laceration Integument/breast: negative for nipple discharge Musculoskeletal:negative for myalgias Neurological: negative for gait problems and tremors Behavioral/Psych: negative for abusive relationship, depression Endocrine: negative for temperature intolerance      Blood pressure 107/81, pulse 67, height 5' 5.5" (1.664 m), weight 156 lb 3.2 oz (70.9 kg), not currently breastfeeding.  Physical Exam Physical Exam General:   alert  Skin:   no rash or abnormalities  Lungs:   clear to auscultation bilaterally  Heart:   regular rate and rhythm, S1, S2 normal, no murmur, click, rub or gallop  Breasts:   normal without suspicious masses, skin or nipple changes or axillary nodes  Abdomen:  normal findings: no organomegaly, soft, non-tender and no hernia  Pelvis:  External genitalia: perineal laceration with ? Granulation tissue Urinary system: urethral meatus normal and bladder without fullness, nontender Vaginal: normal without tenderness, induration or masses Cervix: normal appearance Adnexa: normal bimanual exam Uterus: anteverted and non-tender, normal size    50% of 15 min visit spent on counseling and coordination of care.   Data  Reviewed Pathology  Assessment     1. Retained products of conception-del with p/p  2. S/P D&C (status post dilation and curettage) - doing well  3. Encounter for surveillance of contraceptive pills Rx: - Levonorgest-Eth Estrad-Fe Bisg (BALCOLTRA) 0.1-20 MG-MCG(21) TABS; Take 1 tablet by mouth every 8 (eight) hours. Take 1 tablet every 8 hours for 8 days. Skip non active pills and start new pack.  Dispense: 28  tablet; Refill: 2  4. Umbilical hernia without obstruction and without gangrene Rx: - Ambulatory referral to General Surgery  5. Granulation tissue at obstetrical laceration site - cauterization done ~ 1 week ago - will re-examine in 2 weeks    Plan    Follow up in 2 weeks  Orders Placed This Encounter  Procedures  . Ambulatory referral to General Surgery    Referral Priority:   Routine    Referral Type:   Surgical    Referral Reason:   Specialty Services Required    Requested Specialty:   General Surgery    Number of Visits Requested:   1   Meds ordered this encounter  Medications  . Levonorgest-Eth Estrad-Fe Bisg (BALCOLTRA) 0.1-20 MG-MCG(21) TABS    Sig: Take 1 tablet by mouth every 8 (eight) hours. Take 1 tablet every 8 hours for 8 days. Skip non active pills and start new pack.    Dispense:  28 tablet    Refill:  2     [name] MD 07-23-2017

## 2017-07-23 NOTE — Addendum Note (Signed)
Addended by: Coral CeoHARPER, CHARLES A on: 07/23/2017 01:18 PM   Modules accepted: Orders

## 2017-08-03 ENCOUNTER — Telehealth: Payer: Self-pay

## 2017-08-03 NOTE — Telephone Encounter (Signed)
Pt states that she is still having some bleeding since her D/C. She states that she did not start bleeding right away, but started 5 days after the procedure. She states that she has been bleeding for 10 days now. She wants to know if this is normal because she is taking the birth control pills, and it is not time for her cycle to start.  Discussed with Dr. Clearance CootsHarper have pt to continue to take pills, and if she has not stopped bleeding before she starts her placebo pills then to go straight into the next pack. If she continues to bleed into her next pack then she needs to be evaluated.  Pt informed of the above

## 2017-08-06 ENCOUNTER — Ambulatory Visit (INDEPENDENT_AMBULATORY_CARE_PROVIDER_SITE_OTHER): Payer: BLUE CROSS/BLUE SHIELD | Admitting: Obstetrics

## 2017-08-06 ENCOUNTER — Encounter: Payer: Self-pay | Admitting: Obstetrics

## 2017-08-06 DIAGNOSIS — Z9889 Other specified postprocedural states: Secondary | ICD-10-CM

## 2017-08-06 NOTE — Progress Notes (Signed)
Patient ID: Kristi [name], female   DOB: 12/15/1985, 32 y.o.   MRN: [id number]  Chief Complaint  Patient presents with  . Follow-up    HPI Kristi Shileyiffany Renard HamperHyman is a 32 y.o. female.   History of retained POC postpartum.  S/P D&C.  Presents for post op follow up.  Has light vaginal spotting. HPI  Past Medical History:  Diagnosis Date  . Pap smear abnormality of cervix     Past Surgical History:  Procedure Laterality Date  . COLPOSCOPY  2013-2014  . DILATION AND EVACUATION N/A 07/20/2017   Procedure: DILATATION AND EVACUATION;  Surgeon: Tereso NewcomerAnyanwu, Ugonna A, MD;  Location: [facility];  Service: Gynecology;  Laterality: N/A;  . SIGMOIDOSCOPY      Family History  Problem Relation Age of Onset  . Hypertension Mother   . Stroke Mother        Crist Infantetia  . Hypertension Father   . Cancer Maternal Grandfather   . Cancer Paternal Grandmother        ovarian  . Diabetes Paternal Grandmother     Social History Social History   Tobacco Use  . Smoking status: Never Smoker  . Smokeless tobacco: Never Used  Substance Use Topics  . Alcohol use: Yes    Comment: Occasionally   . Drug use: No    Allergies  Allergen Reactions  . Adhesive [Tape] Rash    Current Outpatient Medications  Medication Sig Dispense Refill  . levonorgestrel-ethinyl estradiol (AVIANE) 0.1-20 MG-MCG tablet Take 1 tablet by mouth daily. 1 Package 11  . Prenatal Vit-Fe Fumarate-FA (PRENATAL MULTIVITAMIN) TABS tablet Take 1 tablet by mouth daily at 12 noon.    . Probiotic Product (PROBIOTIC-10) CAPS Take by mouth.    . docusate sodium (COLACE) 100 MG capsule Take 1 capsule (100 mg total) by mouth 2 (two) times daily as needed for mild constipation or moderate constipation. (Patient not taking: Reported on 08/06/2017) 30 capsule 2  . ibuprofen (ADVIL,MOTRIN) 600 MG tablet Take 1 tablet (600 mg total) by mouth every 6 (six) hours as needed for moderate pain or cramping. (Patient not taking: Reported on 08/06/2017) 30 tablet 2   No current  facility-administered medications for this visit.     Review of Systems Review of Systems Constitutional: negative for fatigue and weight loss Respiratory: negative for cough and wheezing Cardiovascular: negative for chest pain, fatigue and palpitations Gastrointestinal: negative for abdominal pain and change in bowel habits Genitourinary:positive for light vaginal spotting Integument/breast: negative for nipple discharge Musculoskeletal:negative for myalgias Neurological: negative for gait problems and tremors Behavioral/Psych: negative for abusive relationship, depression Endocrine: negative for temperature intolerance      Blood pressure 124/76, pulse 94, weight 153 lb 14.4 oz (69.8 kg), not currently breastfeeding.  Physical Exam Physical Exam:  Deferred  >50 % of 15 min visit spent on counseling and coordination of care.   Data Reviewed CBC  Assessment     1. Postpartum care following vaginal delivery  2. Retained products of conception-del with p/p  3. S/P D&C (status post dilation and curettage) - doing well on OCP's.  Continue OCP's for contraception.    Plan    Follow up in 3 months for Annual   No orders of the defined types were placed in this encounter.  No orders of the defined types were placed in this encounter.    Brock BadHARLES A. Brittny Spangle MD 08-06-2017

## 2017-08-10 ENCOUNTER — Other Ambulatory Visit: Payer: Self-pay | Admitting: Surgery

## 2017-08-10 DIAGNOSIS — K429 Umbilical hernia without obstruction or gangrene: Secondary | ICD-10-CM | POA: Diagnosis not present

## 2017-08-17 ENCOUNTER — Other Ambulatory Visit: Payer: Self-pay

## 2017-08-17 DIAGNOSIS — Z3041 Encounter for surveillance of contraceptive pills: Secondary | ICD-10-CM

## 2017-08-17 MED ORDER — LEVONORGESTREL-ETHINYL ESTRAD 0.1-20 MG-MCG PO TABS: 1.0000 | ORAL_TABLET | Freq: Every day | ORAL | 4 refills | Status: DC

## 2017-08-17 MED ORDER — LEVONORGEST-ETH ESTRADIOL-IRON 0.1-20 MG-MCG(21) PO TABS: 1.0000 | ORAL_TABLET | Freq: Once | ORAL | 4 refills | Status: AC

## 2017-08-17 NOTE — Progress Notes (Signed)
Pt requested contraception be ordered through [facility] for 3 month supply

## 2017-11-26 ENCOUNTER — Ambulatory Visit: Payer: BLUE CROSS/BLUE SHIELD | Admitting: Obstetrics

## 2017-11-27 ENCOUNTER — Encounter: Payer: Self-pay | Admitting: Obstetrics

## 2017-11-27 ENCOUNTER — Ambulatory Visit (INDEPENDENT_AMBULATORY_CARE_PROVIDER_SITE_OTHER): Payer: BLUE CROSS/BLUE SHIELD | Admitting: Obstetrics

## 2017-11-27 VITALS — BP 131/80 | HR 69 | Wt 152.0 lb

## 2017-11-27 DIAGNOSIS — Z01419 Encounter for gynecological examination (general) (routine) without abnormal findings: Secondary | ICD-10-CM

## 2017-11-27 DIAGNOSIS — Z124 Encounter for screening for malignant neoplasm of cervix: Secondary | ICD-10-CM | POA: Diagnosis not present

## 2017-11-27 DIAGNOSIS — N946 Dysmenorrhea, unspecified: Secondary | ICD-10-CM

## 2017-11-27 DIAGNOSIS — Z113 Encounter for screening for infections with a predominantly sexual mode of transmission: Secondary | ICD-10-CM

## 2017-11-27 DIAGNOSIS — Z1151 Encounter for screening for human papillomavirus (HPV): Secondary | ICD-10-CM

## 2017-11-27 DIAGNOSIS — N898 Other specified noninflammatory disorders of vagina: Secondary | ICD-10-CM

## 2017-11-27 DIAGNOSIS — Z3041 Encounter for surveillance of contraceptive pills: Secondary | ICD-10-CM

## 2017-11-27 MED ORDER — TINIDAZOLE 500 MG PO TABS: 1000.0000 mg | ORAL_TABLET | Freq: Every day | ORAL | 2 refills | Status: DC

## 2017-11-27 MED ORDER — FLUCONAZOLE 150 MG PO TABS: 150.0000 mg | ORAL_TABLET | Freq: Once | ORAL | 2 refills | Status: AC

## 2017-11-27 MED ORDER — IBUPROFEN 800 MG PO TABS: 800.0000 mg | ORAL_TABLET | Freq: Three times a day (TID) | ORAL | 5 refills | Status: DC | PRN

## 2017-11-27 NOTE — Progress Notes (Signed)
Patient presents for Annual Exam   LMP:  Contraception: Birth control pills. STD Screening:  Last pap: 11/08/2016 WNL   CC: possible yeast infection

## 2017-11-27 NOTE — Progress Notes (Signed)
Subjective:        Kristi [name] is a 32 y.o. female here for a routine exam.  Current complaints: Vaginal irritation.  Possible yeast infection.    Personal health questionnaire:  Is patient Ashkenazi Jewish, have a family history of breast and/or ovarian cancer: no Is there a family history of uterine cancer diagnosed at age < 77, gastrointestinal cancer, urinary tract cancer, family member who is a Personnel officer syndrome-associated carrier: no Is the patient overweight and hypertensive, family history of diabetes, personal history of gestational diabetes, preeclampsia or PCOS: no Is patient over 92, have PCOS,  family history of premature CHD under age 43, diabetes, smoke, have hypertension or peripheral artery disease:  no At any time, has a partner hit, kicked or otherwise hurt or frightened you?: no Over the past 2 weeks, have you felt down, depressed or hopeless?: no Over the past 2 weeks, have you felt little interest or pleasure in doing things?:no   Gynecologic History Patient's last menstrual period was 11/21/2017. Contraception: OCP (estrogen/progesterone) Last Pap: 2018. Results were: normal Last mammogram: n/a. Results were: n/a  Obstetric History OB History  Gravida Para Term Preterm AB Living  1 1 1     1   SAB TAB Ectopic Multiple Live Births        0 1    # Outcome Date GA Lbr Len/2nd Weight Sex Delivery Anes PTL Lv  1 Term 05/25/17 [redacted]w[redacted]d 19:05 / 03:45 7 lb 13.6 oz (3.56 kg) M Vag-Spont EPI  LIV     Birth Comments: WNL     Complications: Retained products of conception, following delivery with hemorrhage    Obstetric Comments  D&E on 07/20/17 (8 weeks PP) for RPOCs    Past Medical History:  Diagnosis Date  . Pap smear abnormality of cervix     Past Surgical History:  Procedure Laterality Date  . COLPOSCOPY  2013-2014  . DILATION AND EVACUATION N/A 07/20/2017   Procedure: DILATATION AND EVACUATION;  Surgeon: Tereso Newcomer, MD;  Location: [facility];  Service:  Gynecology;  Laterality: N/A;  . SIGMOIDOSCOPY       Current Outpatient Medications:  .  Prenatal Vit-Fe Fumarate-FA (PRENATAL MULTIVITAMIN) TABS tablet, Take 1 tablet by mouth daily at 12 noon., Disp: , Rfl:  .  Probiotic Product (PROBIOTIC-10) CAPS, Take by mouth., Disp: , Rfl:  .  docusate sodium (COLACE) 100 MG capsule, Take 1 capsule (100 mg total) by mouth 2 (two) times daily as needed for mild constipation or moderate constipation. (Patient not taking: Reported on 08/06/2017), Disp: 30 capsule, Rfl: 2 .  fluconazole (DIFLUCAN) 150 MG tablet, Take 1 tablet (150 mg total) by mouth once for 1 dose., Disp: 1 tablet, Rfl: 2 .  ibuprofen (ADVIL,MOTRIN) 800 MG tablet, Take 1 tablet (800 mg total) by mouth every 8 (eight) hours as needed., Disp: 30 tablet, Rfl: 5 .  tinidazole (TINDAMAX) 500 MG tablet, Take 2 tablets (1,000 mg total) by mouth daily with breakfast., Disp: 10 tablet, Rfl: 2 Allergies  Allergen Reactions  . Adhesive [Tape] Rash    Social History   Tobacco Use  . Smoking status: Never Smoker  . Smokeless tobacco: Never Used  Substance Use Topics  . Alcohol use: Yes    Comment: Occasionally     Family History  Problem Relation Age of Onset  . Hypertension Mother   . Stroke Mother        Crist Infante  . Hypertension Father   . Cancer Maternal Grandfather   .  Cancer Paternal Grandmother        ovarian  . Diabetes Paternal Grandmother       Review of Systems  Constitutional: negative for fatigue and weight loss Respiratory: negative for cough and wheezing Cardiovascular: negative for chest pain, fatigue and palpitations Gastrointestinal: negative for abdominal pain and change in bowel habits Musculoskeletal:negative for myalgias Neurological: negative for gait problems and tremors Behavioral/Psych: negative for abusive relationship, depression Endocrine: negative for temperature intolerance    Genitourinary:negative for abnormal menstrual periods, genital lesions, hot  flashes, sexual problems and vaginal discharge Integument/breast: negative for breast lump, breast tenderness, nipple discharge and skin lesion(s)    Objective:       BP 131/80   Pulse 69   Wt 152 lb (68.9 kg)   LMP 11/21/2017   Breastfeeding? No   BMI 24.91 kg/m  General:   alert  Skin:   no rash or abnormalities  Lungs:   clear to auscultation bilaterally  Heart:   regular rate and rhythm, S1, S2 normal, no murmur, click, rub or gallop  Breasts:   normal without suspicious masses, skin or nipple changes or axillary nodes  Abdomen:  normal findings: no organomegaly, soft, non-tender and no hernia  Pelvis:  External genitalia: normal general appearance Urinary system: urethral meatus normal and bladder without fullness, nontender Vaginal: normal without tenderness, induration or masses Cervix: normal appearance Adnexa: normal bimanual exam Uterus: anteverted and non-tender, normal size   Lab Review Urine pregnancy test Labs reviewed yes Radiologic studies reviewed no  50% of 20 min visit spent on counseling and coordination of care.   Assessment:     1. Encounter for annual routine gynecological examination  2. Screening for cervical cancer Rx: - Cytology - PAP  3. Vaginal discharge Rx: - Cervicovaginal ancillary only - fluconazole (DIFLUCAN) 150 MG tablet; Take 1 tablet (150 mg total) by mouth once for 1 dose.  Dispense: 1 tablet; Refill: 2 - tinidazole (TINDAMAX) 500 MG tablet; Take 2 tablets (1,000 mg total) by mouth daily with breakfast.  Dispense: 10 tablet; Refill: 2  4. Dysmenorrhea Rx: - ibuprofen (ADVIL,MOTRIN) 800 MG tablet; Take 1 tablet (800 mg total) by mouth every 8 (eight) hours as needed.  Dispense: 30 tablet; Refill: 5  5. Encounter for surveillance of contraceptive pills - pleased with OCP's    Plan:    Education reviewed: calcium supplements, depression evaluation, low fat, low cholesterol diet, safe sex/STD prevention, self breast exams  and weight bearing exercise. Contraception: OCP (estrogen/progesterone). Follow up in: 1 year.   Meds ordered this encounter  Medications  . fluconazole (DIFLUCAN) 150 MG tablet    Sig: Take 1 tablet (150 mg total) by mouth once for 1 dose.    Dispense:  1 tablet    Refill:  2  . tinidazole (TINDAMAX) 500 MG tablet    Sig: Take 2 tablets (1,000 mg total) by mouth daily with breakfast.    Dispense:  10 tablet    Refill:  2  . ibuprofen (ADVIL,MOTRIN) 800 MG tablet    Sig: Take 1 tablet (800 mg total) by mouth every 8 (eight) hours as needed.    Dispense:  30 tablet    Refill:  5   No orders of the defined types were placed in this encounter.   [name] MD 11-27-2017

## 2017-11-28 LAB — CERVICOVAGINAL ANCILLARY ONLY
Bacterial vaginitis: NEGATIVE
CHLAMYDIA, DNA PROBE: NEGATIVE
Candida vaginitis: NEGATIVE
NEISSERIA GONORRHEA: NEGATIVE
TRICH (WINDOWPATH): NEGATIVE

## 2017-11-29 LAB — CYTOLOGY - PAP
Diagnosis: NEGATIVE
HPV: NOT DETECTED

## 2018-01-28 ENCOUNTER — Telehealth: Payer: Self-pay

## 2018-01-28 NOTE — Telephone Encounter (Signed)
Returned call, pt states that she would like a referral to a hand/wrist specialist due to pain that started when she was pregnant and has not stopped. Routed to provider for review.

## 2018-01-29 ENCOUNTER — Other Ambulatory Visit: Payer: Self-pay | Admitting: Obstetrics

## 2018-01-29 DIAGNOSIS — G5600 Carpal tunnel syndrome, unspecified upper limb: Secondary | ICD-10-CM

## 2018-01-29 NOTE — Telephone Encounter (Signed)
Referred to Orthopedics

## 2018-02-05 ENCOUNTER — Other Ambulatory Visit: Payer: Self-pay | Admitting: Surgery

## 2018-02-05 DIAGNOSIS — K429 Umbilical hernia without obstruction or gangrene: Secondary | ICD-10-CM | POA: Diagnosis not present

## 2018-02-08 ENCOUNTER — Ambulatory Visit (INDEPENDENT_AMBULATORY_CARE_PROVIDER_SITE_OTHER): Payer: BLUE CROSS/BLUE SHIELD | Admitting: Orthopaedic Surgery

## 2018-02-08 ENCOUNTER — Encounter (INDEPENDENT_AMBULATORY_CARE_PROVIDER_SITE_OTHER): Payer: Self-pay | Admitting: Orthopaedic Surgery

## 2018-02-08 DIAGNOSIS — M654 Radial styloid tenosynovitis [de Quervain]: Secondary | ICD-10-CM | POA: Diagnosis not present

## 2018-02-08 NOTE — Progress Notes (Signed)
Office Visit Note   Patient: Kristi [name]           Date of Birth: 09/06/1985           MRN: [id number] Visit Date: 02/08/2018              Requested by: Brock BadHarper, Charles A, MD [address] [place], [place] [id number] PCP: Patient, No Pcp Per   Assessment & Plan: Visit Diagnoses:  1. De Quervain's tenosynovitis, bilateral     Plan: Impression is bilateral de Quervain's tenosynovitis.  We provided her with thumb spica braces today.  I gave her sample of Pennsaid gel.  Patient politely declined cortisone injections today.  Questions encouraged and answered.  Follow-up as needed.  Follow-Up Instructions: Return if symptoms worsen or fail to improve.   Orders:  No orders of the defined types were placed in this encounter.  No orders of the defined types were placed in this encounter.     Procedures: No procedures performed   Clinical Data: No additional findings.   Subjective: Chief Complaint  Patient presents with  . Right Wrist - Pain  . Left Wrist - Pain    Kristi [name] is a 33 year old female who works as an Designer, industrial/productorthopedic nurse at [employer] who comes in with bilateral radial sided wrist pain for several months.  She is a mother of a 5794-month-old baby.  She has been wearing a brace occasionally that does help.  She takes occasional NSAID which does help.  She reports pain that radiates into her thumb and upper wrist.  Denies any numbness and tingling or any hand falling asleep or dropping objects.   Review of Systems  Constitutional: Negative.   HENT: Negative.   Eyes: Negative.   Respiratory: Negative.   Cardiovascular: Negative.   Endocrine: Negative.   Musculoskeletal: Negative.   Neurological: Negative.   Hematological: Negative.   Psychiatric/Behavioral: Negative.   All other systems reviewed and are negative.    Objective: Vital Signs: There were no vitals taken for this visit.  Physical Exam Vitals signs and nursing note  reviewed.  Constitutional:      Appearance: She is well-developed.  HENT:     Head: Normocephalic and atraumatic.  Neck:     Musculoskeletal: Neck supple.  Pulmonary:     Effort: Pulmonary effort is normal.  Abdominal:     Palpations: Abdomen is soft.  Skin:    General: Skin is warm.     Capillary Refill: Capillary refill takes less than 2 seconds.  Neurological:     Mental Status: She is alert and oriented to person, place, and time.  Psychiatric:        Behavior: Behavior normal.        Thought Content: Thought content normal.        Judgment: Judgment normal.     Ortho Exam Bilateral wrist exam shows a positive Finkelstein's.  Negative carpal tunnel compressive signs.  No tendon subluxation.  Normal wrist range of motion.  No swelling or soft tissue crepitus. Specialty Comments:  No specialty comments available.  Imaging: No results found.   PMFS History: Patient Active Problem List   Diagnosis Date Noted  . Retained products of conception, following delivery with hemorrhage 07/20/2017   Past Medical History:  Diagnosis Date  . Pap smear abnormality of cervix     Family History  Problem Relation Age of Onset  . Hypertension Mother   . Stroke Mother  tia  . Hypertension Father   . Cancer Maternal Grandfather   . Cancer Paternal Grandmother        ovarian  . Diabetes Paternal Grandmother     Past Surgical History:  Procedure Laterality Date  . COLPOSCOPY  2013-2014  . DILATION AND EVACUATION N/A 07/20/2017   Procedure: DILATATION AND EVACUATION;  Surgeon: Tereso Newcomer, MD;  Location: [facility];  Service: Gynecology;  Laterality: N/A;  . SIGMOIDOSCOPY     Social History   Occupational History  . Not on file  Tobacco Use  . Smoking status: Never Smoker  . Smokeless tobacco: Never Used  Substance and Sexual Activity  . Alcohol use: Yes    Comment: Occasionally   . Drug use: No  . Sexual activity: Yes    Birth control/protection: Pill

## 2018-04-02 ENCOUNTER — Ambulatory Visit (INDEPENDENT_AMBULATORY_CARE_PROVIDER_SITE_OTHER): Payer: BLUE CROSS/BLUE SHIELD | Admitting: Obstetrics

## 2018-04-02 ENCOUNTER — Encounter: Payer: Self-pay | Admitting: Obstetrics

## 2018-04-02 VITALS — BP 118/79 | HR 88 | Ht 65.0 in | Wt 149.1 lb

## 2018-04-02 DIAGNOSIS — Z113 Encounter for screening for infections with a predominantly sexual mode of transmission: Secondary | ICD-10-CM

## 2018-04-02 DIAGNOSIS — S30824A Blister (nonthermal) of vagina and vulva, initial encounter: Secondary | ICD-10-CM | POA: Diagnosis not present

## 2018-04-02 DIAGNOSIS — B3731 Acute candidiasis of vulva and vagina: Secondary | ICD-10-CM

## 2018-04-02 DIAGNOSIS — N898 Other specified noninflammatory disorders of vagina: Secondary | ICD-10-CM

## 2018-04-02 DIAGNOSIS — B373 Candidiasis of vulva and vagina: Secondary | ICD-10-CM | POA: Diagnosis not present

## 2018-04-02 MED ORDER — TERCONAZOLE 0.8 % VA CREA: 1.0000 | TOPICAL_CREAM | Freq: Every day | VAGINAL | 0 refills | Status: DC

## 2018-04-02 MED ORDER — FLUCONAZOLE 200 MG PO TABS: 200.0000 mg | ORAL_TABLET | ORAL | 2 refills | Status: DC

## 2018-04-02 NOTE — Progress Notes (Signed)
Patient ID: Kristi [name], female   DOB: [birth year], 33 y.o.   MRN: [id number]  Chief Complaint  Patient presents with  . Vaginal Pain    HPI Kristi [name] is a 33 y.o. female.  History of vaginal burning and pain over the past few weeks.  Denies any obvious sores, lesions or vaginal discharge. HPI  Past Medical History:  Diagnosis Date  . Pap smear abnormality of cervix     Past Surgical History:  Procedure Laterality Date  . COLPOSCOPY  2013-2014  . DILATION AND EVACUATION N/A 07/20/2017   Procedure: DILATATION AND EVACUATION;  Surgeon: Tereso Newcomer, [name];  Location: [facility];  Service: Gynecology;  Laterality: N/A;  . SIGMOIDOSCOPY      Family History  Problem Relation Age of Onset  . Hypertension Mother   . Stroke Mother        Kristi [name]  . Hypertension Father   . Cancer Maternal Grandfather   . Cancer Paternal Grandmother        ovarian  . Diabetes Paternal Grandmother     Social History Social History   Tobacco Use  . Smoking status: Never Smoker  . Smokeless tobacco: Never Used  Substance Use Topics  . Alcohol use: Yes    Comment: Occasionally   . Drug use: No    Allergies  Allergen Reactions  . Adhesive [Tape] Rash    Current Outpatient Medications  Medication Sig Dispense Refill  . BALCOLTRA 0.1-20 MG-MCG(21) TABS     . docusate sodium (COLACE) 100 MG capsule Take 1 capsule (100 mg total) by mouth 2 (two) times daily as needed for mild constipation or moderate constipation. (Patient not taking: Reported on 08/06/2017) 30 capsule 2  . fluconazole (DIFLUCAN) 200 MG tablet Take 1 tablet (200 mg total) by mouth every 3 (three) days. 3 tablet 2  . ibuprofen (ADVIL,MOTRIN) 800 MG tablet Take 1 tablet (800 mg total) by mouth every 8 (eight) hours as needed. 30 tablet 5  . Prenatal Vit-Fe Fumarate-FA (PRENATAL MULTIVITAMIN) TABS tablet Take 1 tablet by mouth daily at 12 noon.    . Probiotic Product (PROBIOTIC-10) CAPS Take by mouth.    . terconazole (TERAZOL 3) 0.8  % vaginal cream Place 1 applicator vaginally at bedtime. 20 g 0  . tinidazole (TINDAMAX) 500 MG tablet Take 2 tablets (1,000 mg total) by mouth daily with breakfast. (Patient not taking: Reported on 04/02/2018) 10 tablet 2   No current facility-administered medications for this visit.     Review of Systems Review of Systems Constitutional: negative for fatigue and weight loss Respiratory: negative for cough and wheezing Cardiovascular: negative for chest pain, fatigue and palpitations Gastrointestinal: negative for abdominal pain and change in bowel habits Genitourinary:positive for vaginal burning and pain Integument/breast: negative for nipple discharge Musculoskeletal:negative for myalgias Neurological: negative for gait problems and tremors Behavioral/Psych: negative for abusive relationship, depression Endocrine: negative for temperature intolerance      Blood pressure 118/79, pulse 88, height 5\' 5"  (1.651 m), weight 149 lb 1.6 oz (67.6 kg), last menstrual period 03/11/2018, not currently breastfeeding.  Physical Exam Physical Exam           General:  Alert and no distress Abdomen:  normal findings: no organomegaly, soft, non-tender and no hernia  Pelvis:  External genitalia: few labial excoriations observed and cultured Urinary system: urethral meatus normal and bladder without fullness, nontender Vaginal: normal without tenderness, induration or masses Cervix: normal appearance Adnexa: normal bimanual exam Uterus: anteverted and non-tender, normal size  50% of 15 min visit spent on counseling and coordination of care.   Data Reviewed Wet Prep  Assessment     1. Vaginal irritation  2. Vaginal discharge Rx: - Cervicovaginal ancillary only( [place])  3. Candida vaginitis Rx: - terconazole (TERAZOL 3) 0.8 % vaginal cream; Place 1 applicator vaginally at bedtime.  Dispense: 20 g; Refill: 0 - fluconazole (DIFLUCAN) 200 MG tablet; Take 1 tablet (200 mg total) by  mouth every 3 (three) days.  Dispense: 3 tablet; Refill: 2  4. Blister (nonthermal) of vagina and vulva, initial encounter Rx: - Herpes simplex virus culture    Plan    Follow up prn  Orders Placed This Encounter  Procedures  . HSV(herpes smplx)abs-1+2(IgG+IgM)-bld   Meds ordered this encounter  Medications  . terconazole (TERAZOL 3) 0.8 % vaginal cream    Sig: Place 1 applicator vaginally at bedtime.    Dispense:  20 g    Refill:  0  . fluconazole (DIFLUCAN) 200 MG tablet    Sig: Take 1 tablet (200 mg total) by mouth every 3 (three) days.    Dispense:  3 tablet    Refill:  2    Kristi [name] 04-02-2018

## 2018-04-02 NOTE — Progress Notes (Signed)
Presents for vaginal pain 8/10 and burning x 1 week.

## 2018-04-04 LAB — CERVICOVAGINAL ANCILLARY ONLY
BACTERIAL VAGINITIS: NEGATIVE
CANDIDA VAGINITIS: NEGATIVE
Chlamydia: NEGATIVE
Neisseria Gonorrhea: NEGATIVE
Trichomonas: NEGATIVE

## 2018-04-05 LAB — HERPES SIMPLEX VIRUS CULTURE

## 2018-09-19 ENCOUNTER — Other Ambulatory Visit: Payer: Self-pay | Admitting: Obstetrics

## 2018-09-19 DIAGNOSIS — Z30011 Encounter for initial prescription of contraceptive pills: Secondary | ICD-10-CM

## 2018-10-03 ENCOUNTER — Telehealth: Payer: Self-pay

## 2018-10-03 NOTE — Telephone Encounter (Signed)
Pt called and left message about birth control quantity. Returned patient call, no answer, left message on vm for pt to return call to discuss.

## 2018-12-07 DIAGNOSIS — R0989 Other specified symptoms and signs involving the circulatory and respiratory systems: Secondary | ICD-10-CM | POA: Diagnosis not present

## 2018-12-07 DIAGNOSIS — Z20828 Contact with and (suspected) exposure to other viral communicable diseases: Secondary | ICD-10-CM | POA: Diagnosis not present

## 2018-12-07 DIAGNOSIS — B349 Viral infection, unspecified: Secondary | ICD-10-CM | POA: Diagnosis not present

## 2018-12-16 ENCOUNTER — Telehealth: Payer: Self-pay

## 2018-12-16 ENCOUNTER — Other Ambulatory Visit: Payer: Self-pay

## 2018-12-16 DIAGNOSIS — B373 Candidiasis of vulva and vagina: Secondary | ICD-10-CM

## 2018-12-16 DIAGNOSIS — N898 Other specified noninflammatory disorders of vagina: Secondary | ICD-10-CM

## 2018-12-16 DIAGNOSIS — B3731 Acute candidiasis of vulva and vagina: Secondary | ICD-10-CM

## 2018-12-16 MED ORDER — TINIDAZOLE 500 MG PO TABS: 1000.0000 mg | ORAL_TABLET | Freq: Every day | ORAL | 0 refills | Status: DC

## 2018-12-16 MED ORDER — FLUCONAZOLE 200 MG PO TABS: 200.0000 mg | ORAL_TABLET | ORAL | 0 refills | Status: DC

## 2018-12-16 NOTE — Telephone Encounter (Signed)
Return call to pt  Pt requested BV and yeast Rx Consultd w/ provider ok to send Rx as last time. LMOVM to pt ands ent Mychart message Rx was sent.

## 2018-12-16 NOTE — Progress Notes (Signed)
Rx ok to send as last order per Dr.Harper.  Pt made aware.

## 2018-12-27 DIAGNOSIS — Z1159 Encounter for screening for other viral diseases: Secondary | ICD-10-CM | POA: Diagnosis not present

## 2019-03-08 DIAGNOSIS — Z20822 Contact with and (suspected) exposure to covid-19: Secondary | ICD-10-CM | POA: Diagnosis not present

## 2019-03-08 DIAGNOSIS — R0981 Nasal congestion: Secondary | ICD-10-CM | POA: Diagnosis not present

## 2019-03-08 DIAGNOSIS — R0689 Other abnormalities of breathing: Secondary | ICD-10-CM | POA: Diagnosis not present

## 2019-04-02 ENCOUNTER — Other Ambulatory Visit: Payer: Self-pay

## 2019-04-02 ENCOUNTER — Encounter: Payer: Self-pay | Admitting: Nurse Practitioner

## 2019-04-02 ENCOUNTER — Ambulatory Visit (INDEPENDENT_AMBULATORY_CARE_PROVIDER_SITE_OTHER): Payer: BC Managed Care – PPO | Admitting: Nurse Practitioner

## 2019-04-02 VITALS — BP 108/72 | HR 71 | Wt 152.0 lb

## 2019-04-02 DIAGNOSIS — Z113 Encounter for screening for infections with a predominantly sexual mode of transmission: Secondary | ICD-10-CM

## 2019-04-02 DIAGNOSIS — B9689 Other specified bacterial agents as the cause of diseases classified elsewhere: Secondary | ICD-10-CM | POA: Diagnosis not present

## 2019-04-02 DIAGNOSIS — N898 Other specified noninflammatory disorders of vagina: Secondary | ICD-10-CM

## 2019-04-02 DIAGNOSIS — N760 Acute vaginitis: Secondary | ICD-10-CM | POA: Diagnosis not present

## 2019-04-02 NOTE — Progress Notes (Signed)
   GYNECOLOGY OFFICE VISIT NOTE   History:  34 y.o. G1P1001 here today for vaginal discharge with odor. She denies any abnormal bleeding, pelvic pain or other concerns. Last pap was normal on 11-27-17  Past Medical History:  Diagnosis Date  . Pap smear abnormality of cervix     Past Surgical History:  Procedure Laterality Date  . COLPOSCOPY  2013-2014  . DILATION AND EVACUATION N/A 07/20/2017   Procedure: DILATATION AND EVACUATION;  Surgeon: Tereso Newcomer, MD;  Location: [facility];  Service: Gynecology;  Laterality: N/A;  . SIGMOIDOSCOPY      The following portions of the patient's history were reviewed and updated as appropriate: allergies, current medications, past family history, past medical history, past social history, past surgical history and problem list.   Health Maintenance:  Normal pap 11-27-17.   Review of Systems:  Pertinent items noted in HPI and remainder of comprehensive ROS otherwise negative.  Objective:  Physical Exam BP 108/72   Pulse 71   Wt 152 lb (68.9 kg)   LMP 03/25/2019   BMI 25.29 kg/m  CONSTITUTIONAL: Well-developed, well-nourished female in no acute distress.  HENT:  Normocephalic, atraumatic. External right and left ear normal.  EYES: Conjunctivae and EOM are normal. Pupils are equal, round.  No scleral icterus.  NECK: Normal range of motion, supple, no masses SKIN: Skin is warm and dry. No rash noted. Not diaphoretic. No erythema. No pallor. NEUROLOGIC: Alert and oriented to person, place, and time. Normal muscle tone coordination. No cranial nerve deficit noted. PSYCHIATRIC: Normal mood and affect. Normal behavior. Normal judgment and thought content. CARDIOVASCULAR: Normal heart rate noted RESPIRATORY: Effort and breath sounds normal, no problems with respiration noted ABDOMEN: Soft, no distention noted.   PELVIC: Normal appearing external genitalia; normal appearing vaginal mucosa and cervix.  No abnormal discharge noted.  Normal uterine  size, no other palpable masses, no uterine or adnexal tenderness.   Large amt of stool noted in rectum on bimanual exam.   MUSCULOSKELETAL: Normal range of motion. No edema noted.  Labs and Imaging No results found.  Assessment & Plan:  1. Vaginal discharge Will await labs for diagnosis and treatment.  - Cervicovaginal ancillary only( [place])   Routine preventative health maintenance measures emphasized. Please refer to After Visit Summary for other counseling recommendations.   No follow-ups on file.   Total face-to-face time with patient: 10 minutes.  Over 50% of encounter was spent on counseling and coordination of care.  Nolene Bernheim, RN, MSN, NP-BC Nurse Practitioner, [facility] for [employer], [facility] 04/02/2019 3:15 PM

## 2019-04-03 LAB — CERVICOVAGINAL ANCILLARY ONLY
Bacterial Vaginitis (gardnerella): POSITIVE — AB
Candida Glabrata: NEGATIVE
Candida Vaginitis: NEGATIVE
Chlamydia: NEGATIVE
Comment: NEGATIVE
Comment: NEGATIVE
Comment: NEGATIVE
Comment: NEGATIVE
Comment: NEGATIVE
Comment: NORMAL
Neisseria Gonorrhea: NEGATIVE
Trichomonas: NEGATIVE

## 2019-04-03 MED ORDER — METRONIDAZOLE 500 MG PO TABS: 500.0000 mg | ORAL_TABLET | Freq: Two times a day (BID) | ORAL | 0 refills | Status: DC

## 2019-04-03 NOTE — Addendum Note (Signed)
Addended by: Currie Paris on: 04/03/2019 10:40 PM   Modules accepted: Orders

## 2019-04-04 ENCOUNTER — Telehealth: Payer: Self-pay

## 2019-04-04 ENCOUNTER — Other Ambulatory Visit: Payer: Self-pay

## 2019-04-04 DIAGNOSIS — N898 Other specified noninflammatory disorders of vagina: Secondary | ICD-10-CM

## 2019-04-04 MED ORDER — TINIDAZOLE 500 MG PO TABS: 1000.0000 mg | ORAL_TABLET | Freq: Every day | ORAL | 0 refills | Status: DC

## 2019-04-04 NOTE — Telephone Encounter (Signed)
TC from pt requesting Tindamax instead of flagyl for BV  Ok per provider to change.

## 2019-04-04 NOTE — Progress Notes (Signed)
Pt does not do well with Flagyl Requested Rx change ok per provider,  Rx sent

## 2019-06-20 IMAGING — US US MFM FETAL BPP W/O NON-STRESS
1 series · 13 of 14 positions shown · non-contrast
Comparison: none

[Series 1: us mfm fetal bpp w/o non-stress · 14 acquisitions, 13 frames shown]
[im 1/14]
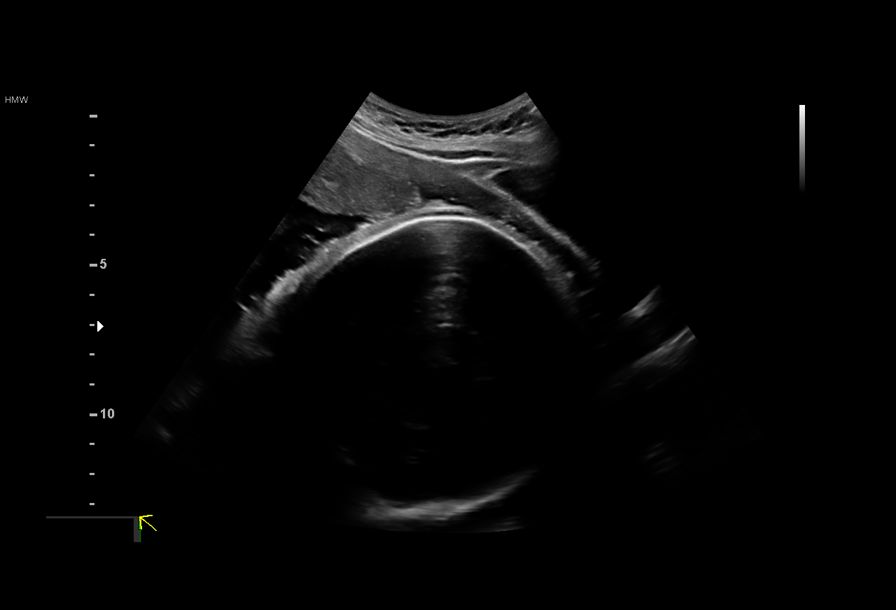
[im 2/14]
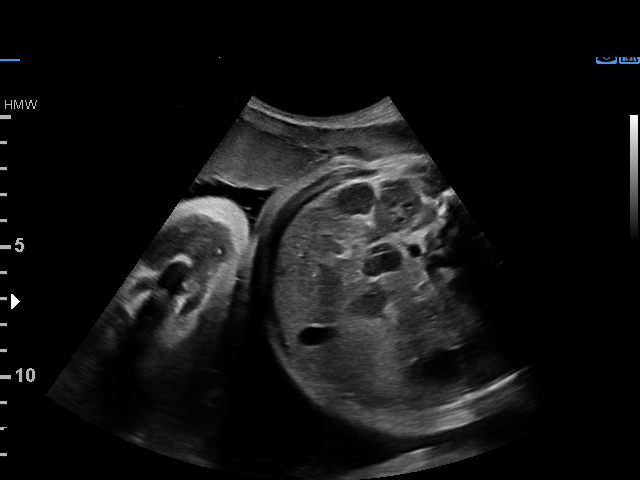
[im 3/14]
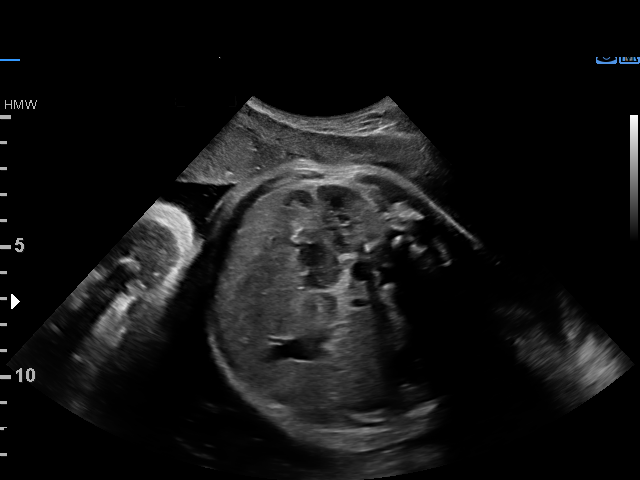
[im 4/14]
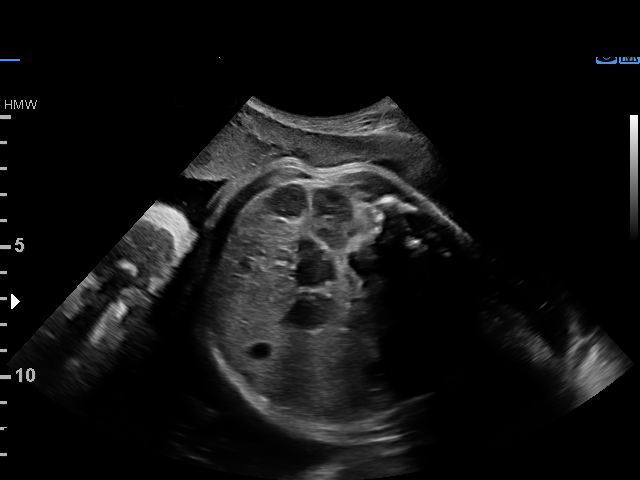
[im 5/14]
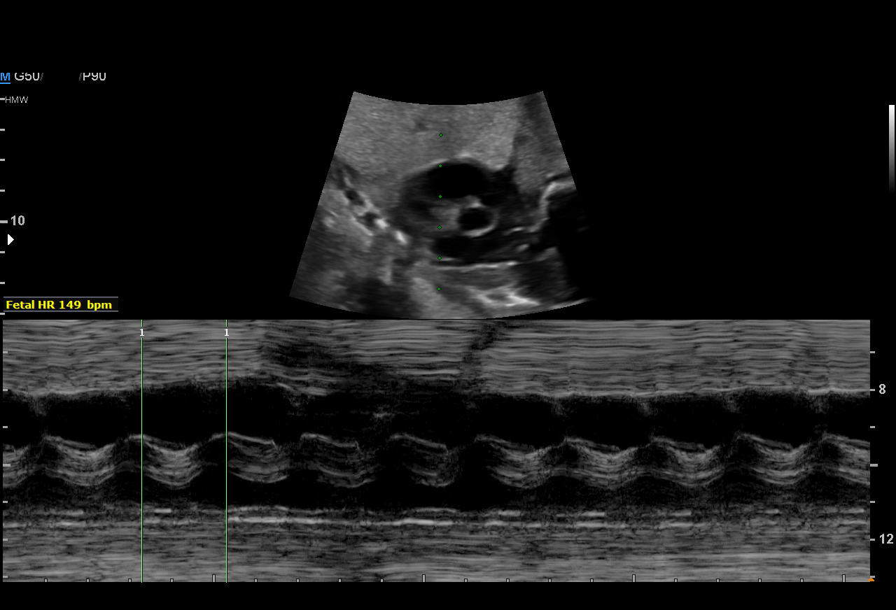
[im 6/14]
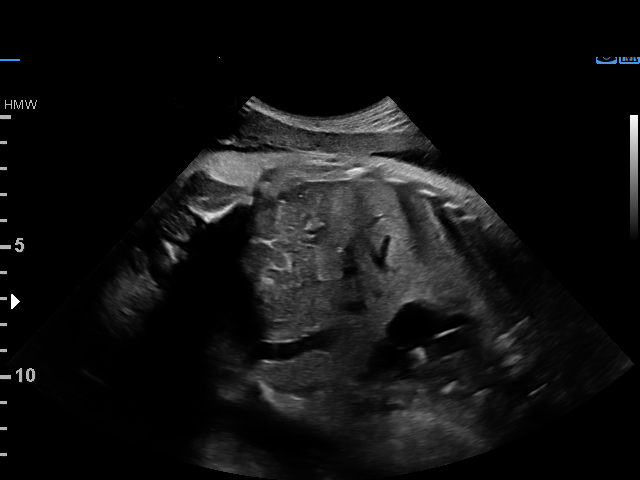
[im 8/14]
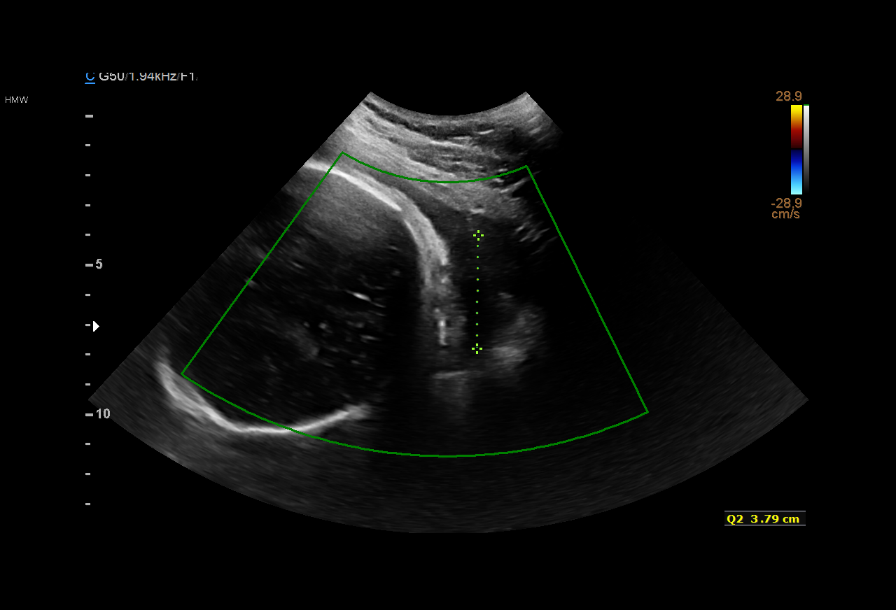
[im 9/14]
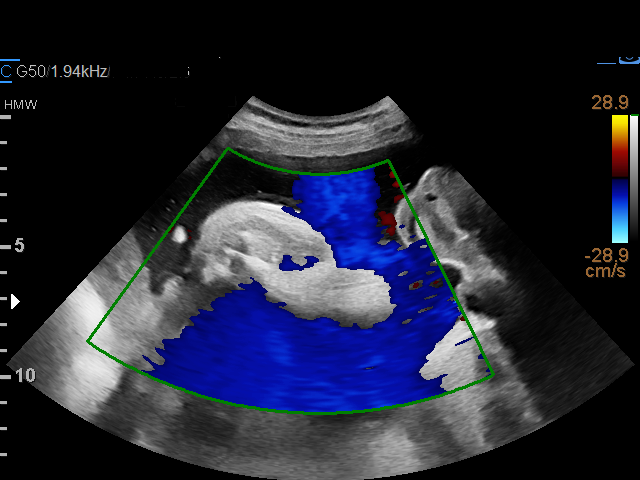
[im 10/14]
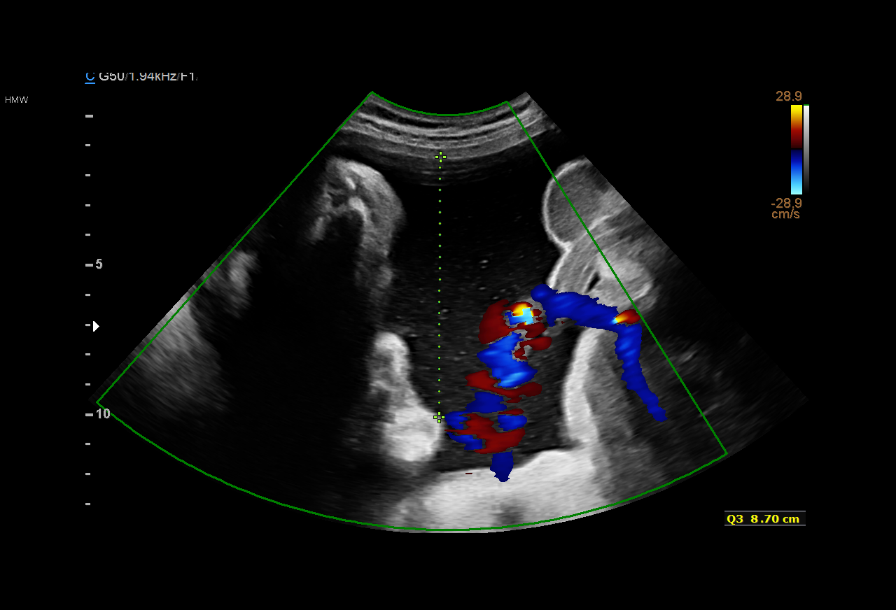
[im 11/14]
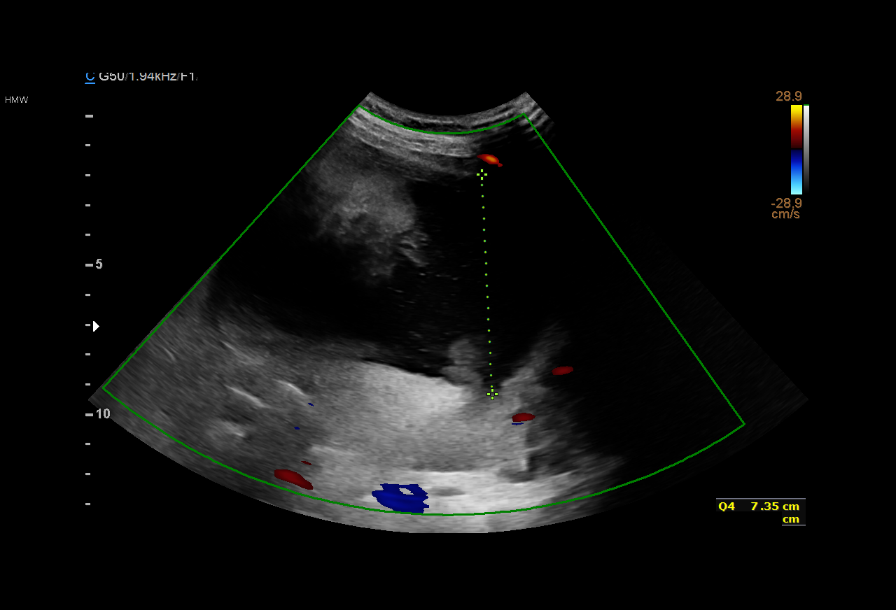
[im 12/14]
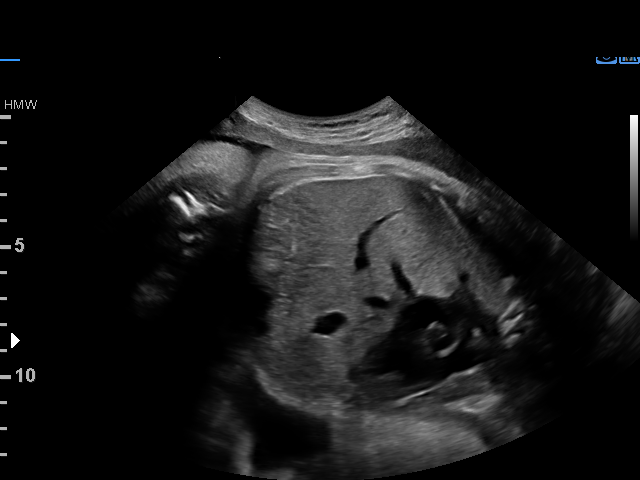
[im 13/14]
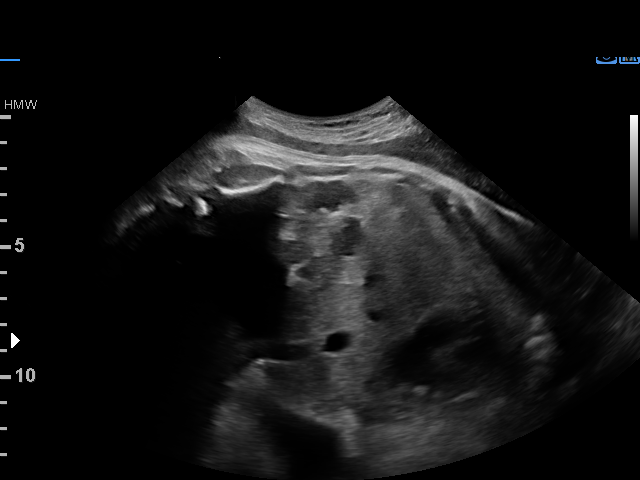
[im 14/14]
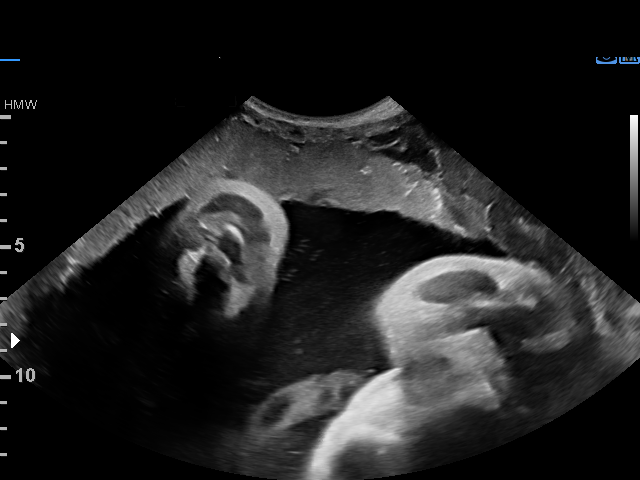

[13 of 14 positions shown; findings below may reference images not displayed]

Road [HOSPITAL]

[address]  [facility]           [id number]      [id number]     [id number]
Indications

37 weeks gestation of pregnancy
Polyhydramnios, third trimester, antepartum
condition or complication, unspecified fetus
OB History

Blood Type:            Height:  5'5"   Weight (lb):  144       BMI:
Gravidity:    1         Term:   0        Prem:   0        SAB:   0
TOP:          0       Ectopic:  0        Living: 0
Fetal Evaluation

Num Of Fetuses:     1
Fetal Heart         149
Rate(bpm):
Cardiac Activity:   Observed
Presentation:       Cephalic

Amniotic Fluid
AFI FV:      Polyhydramnios

AFI Sum(cm)     %Tile       Largest Pocket(cm)
25.47           96

RUQ(cm)       RLQ(cm)       LUQ(cm)        LLQ(cm)
5.63
Biophysical Evaluation

Amniotic F.V:   Polyhydramnios             F. Tone:        Observed
F. Movement:    Observed                   Score:          [DATE]
F. Breathing:   Observed
Gestational Age

LMP:           37w 0d        Date:  08/24/16                 EDD:   05/31/17
Best:          37w 0d     Det. By:  LMP  (08/24/16)          EDD:   05/31/17
Cervix Uterus Adnexa

Cervix
Not visualized (advanced GA >10wks)
Impression

Intrauterine pregnancy at 37+0 weeks with polyhydramnios
Mildly elevated amniotic fluid with AFI of 25.5 cm
BPP [DATE]
Recommendations

Recommend continuing antepartum testing until delivery

## 2019-07-10 DIAGNOSIS — M7542 Impingement syndrome of left shoulder: Secondary | ICD-10-CM | POA: Diagnosis not present

## 2019-07-10 DIAGNOSIS — M25512 Pain in left shoulder: Secondary | ICD-10-CM | POA: Diagnosis not present

## 2019-08-13 ENCOUNTER — Other Ambulatory Visit: Payer: Self-pay | Admitting: Obstetrics

## 2019-08-13 DIAGNOSIS — B3731 Acute candidiasis of vulva and vagina: Secondary | ICD-10-CM

## 2019-09-22 ENCOUNTER — Ambulatory Visit (INDEPENDENT_AMBULATORY_CARE_PROVIDER_SITE_OTHER): Payer: 59 | Admitting: Obstetrics

## 2019-09-22 ENCOUNTER — Other Ambulatory Visit (HOSPITAL_COMMUNITY)
Admission: RE | Admit: 2019-09-22 | Discharge: 2019-09-22 | Disposition: A | Discharge status: Home / Self Care | Payer: 59 | Source: Ambulatory Visit | Attending: Obstetrics | Admitting: Obstetrics

## 2019-09-22 ENCOUNTER — Other Ambulatory Visit: Payer: Self-pay

## 2019-09-22 ENCOUNTER — Encounter: Payer: Self-pay | Admitting: Obstetrics

## 2019-09-22 VITALS — BP 132/72 | Ht 65.0 in | Wt 143.4 lb

## 2019-09-22 DIAGNOSIS — Z3169 Encounter for other general counseling and advice on procreation: Secondary | ICD-10-CM

## 2019-09-22 DIAGNOSIS — N760 Acute vaginitis: Secondary | ICD-10-CM

## 2019-09-22 DIAGNOSIS — Z01419 Encounter for gynecological examination (general) (routine) without abnormal findings: Secondary | ICD-10-CM

## 2019-09-22 DIAGNOSIS — N898 Other specified noninflammatory disorders of vagina: Secondary | ICD-10-CM

## 2019-09-22 DIAGNOSIS — B9689 Other specified bacterial agents as the cause of diseases classified elsewhere: Secondary | ICD-10-CM | POA: Diagnosis not present

## 2019-09-22 DIAGNOSIS — Z01411 Encounter for gynecological examination (general) (routine) with abnormal findings: Secondary | ICD-10-CM | POA: Diagnosis not present

## 2019-09-22 DIAGNOSIS — A599 Trichomoniasis, unspecified: Secondary | ICD-10-CM | POA: Diagnosis not present

## 2019-09-22 DIAGNOSIS — Z3009 Encounter for other general counseling and advice on contraception: Secondary | ICD-10-CM

## 2019-09-22 MED ORDER — PRENATAL PLUS 27-1 MG PO TABS: 1.0000 | ORAL_TABLET | Freq: Every day | ORAL | 3 refills | Status: DC

## 2019-09-22 MED ORDER — PRENATAL PLUS 27-1 MG PO TABS: 1.0000 | ORAL_TABLET | Freq: Every day | ORAL | 11 refills | Status: DC

## 2019-09-22 NOTE — Progress Notes (Signed)
Last pap: 11/27/2017 Pt. Request STI testing - cultures only  Pt. Is not on b/c at this time

## 2019-09-22 NOTE — Progress Notes (Signed)
Subjective:        Kristi [name] is a 34 y.o. female here for a routine exam.  Current complaints: Vaginal discharge with irritation..    Personal health questionnaire:  Is patient Ashkenazi Jewish, have a family history of breast and/or ovarian cancer: no Is there a family history of uterine cancer diagnosed at age < 43, gastrointestinal cancer, urinary tract cancer, family member who is a Personnel officer syndrome-associated carrier: no Is the patient overweight and hypertensive, family history of diabetes, personal history of gestational diabetes, preeclampsia or PCOS: no Is patient over 37, have PCOS,  family history of premature CHD under age 30, diabetes, smoke, have hypertension or peripheral artery disease:  no At any time, has a partner hit, kicked or otherwise hurt or frightened you?: no Over the past 2 weeks, have you felt down, depressed or hopeless?: no Over the past 2 weeks, have you felt little interest or pleasure in doing things?:no   Gynecologic History Patient's last menstrual period was 09/12/2019 (exact date). Contraception: none Last Pap: 11-27-2017. Results were: normal Last mammogram: n/a. Results were: n/a  Obstetric History OB History  Gravida Para Term Preterm AB Living  1 1 1     1   SAB TAB Ectopic Multiple Live Births        0 1    # Outcome Date GA Lbr Len/2nd Weight Sex Delivery Anes PTL Lv  1 Term 05/25/17 [redacted]w[redacted]d 19:05 / 03:45 7 lb 13.6 oz (3.56 kg) M Vag-Spont EPI  LIV     Birth Comments: WNL     Complications: Retained products of conception, following delivery with hemorrhage    Obstetric Comments  D&E on 07/20/17 (8 weeks PP) for RPOCs    Past Medical History:  Diagnosis Date   Pap smear abnormality of cervix     Past Surgical History:  Procedure Laterality Date   COLPOSCOPY  2013-2014   DILATION AND EVACUATION N/A 07/20/2017   Procedure: DILATATION AND EVACUATION;  Surgeon: 07/22/2017, MD;  Location: [facility];  Service: Gynecology;   Laterality: N/A;   SIGMOIDOSCOPY       Current Outpatient Medications:    fluconazole (DIFLUCAN) 200 MG tablet, TAKE 1 TABLET (200 MG TOTAL) BY MOUTH EVERY 3 (THREE) DAYS., Disp: 3 tablet, Rfl: 0   metroNIDAZOLE (FLAGYL) 500 MG tablet, Take 1 tablet (500 mg total) by mouth 2 (two) times daily. No alcohol while taking this medication, Disp: 14 tablet, Rfl: 0   Probiotic Product (PROBIOTIC-10) CAPS, Take by mouth., Disp: , Rfl:    tinidazole (TINDAMAX) 500 MG tablet, Take 2 tablets (1,000 mg total) by mouth daily with breakfast., Disp: 10 tablet, Rfl: 0   Prenatal Vit-Fe Fumarate-FA (PRENATAL MULTIVITAMIN) TABS tablet, Take 1 tablet by mouth daily at 12 noon. (Patient not taking: Reported on 09/22/2019), Disp: , Rfl:    prenatal vitamin w/FE, FA (PRENATAL 1 + 1) 27-1 MG TABS tablet, Take 1 tablet by mouth daily before breakfast., Disp: 90 tablet, Rfl: 3 Allergies  Allergen Reactions   Adhesive [Tape] Rash    Social History   Tobacco Use   Smoking status: Never Smoker   Smokeless tobacco: Never Used  Substance Use Topics   Alcohol use: Yes    Comment: Occasionally     Family History  Problem Relation Age of Onset   Hypertension Mother    Stroke Mother        tia   Hypertension Father    Cancer Maternal Grandfather  Cancer Paternal Grandmother        ovarian   Diabetes Paternal Grandmother       Review of Systems  Constitutional: negative for fatigue and weight loss Respiratory: negative for cough and wheezing Cardiovascular: negative for chest pain, fatigue and palpitations Gastrointestinal: negative for abdominal pain and change in bowel habits Musculoskeletal:negative for myalgias Neurological: negative for gait problems and tremors Behavioral/Psych: negative for abusive relationship, depression Endocrine: negative for temperature intolerance    Genitourinary:negative for abnormal menstrual periods, genital lesions, hot flashes, sexual problems.   Positive for vaginal discharge Integument/breast: negative for breast lump, breast tenderness, nipple discharge and skin lesion(s)    Objective:       BP 132/72    Ht 5\' 5"  (1.651 m)    Wt 143 lb 6.4 oz (65 kg)    LMP 09/12/2019 (Exact Date)    BMI 23.86 kg/m  General:   alert and no distress  Skin:   no rash or abnormalities  Lungs:   clear to auscultation bilaterally  Heart:   regular rate and rhythm, S1, S2 normal, no murmur, click, rub or gallop  Breasts:   normal without suspicious masses, skin or nipple changes or axillary nodes  Abdomen:  normal findings: no organomegaly, soft, non-tender and no hernia  Pelvis:  External genitalia: normal general appearance Urinary system: urethral meatus normal and bladder without fullness, nontender Vaginal: normal without tenderness, induration or masses Cervix: normal appearance Adnexa: normal bimanual exam Uterus: anteverted and non-tender, normal size   Lab Review Urine pregnancy test Labs reviewed yes Radiologic studies reviewed no  50% of 20 min visit spent on counseling and coordination of care.   Assessment:     1. Encounter for routine gynecological examination with Papanicolaou smear of cervix Rx: - Cytology - PAP  2. Vaginal discharge Rx: - Cervicovaginal ancillary only  3. Encounter for other general counseling and advice on contraception - trying to conceive  4. Encounter for preconception consultation Rx: - prenatal vitamin w/FE, FA (PRENATAL 1 + 1) 27-1 MG TABS tablet; Take 1 tablet by mouth daily before breakfast.  Dispense: 90 tablet; Refill: 3 - importance of Folic Acid discussed, along with good diet and exercise    Plan:    Education reviewed: calcium supplements, depression evaluation, low fat, low cholesterol diet, safe sex/STD prevention, self breast exams and weight bearing exercise. Contraception: none. Follow up in: 1 year.   Meds ordered this encounter  Medications   DISCONTD: prenatal  vitamin w/FE, FA (PRENATAL 1 + 1) 27-1 MG TABS tablet    Sig: Take 1 tablet by mouth daily before breakfast.    Dispense:  30 tablet    Refill:  11   prenatal vitamin w/FE, FA (PRENATAL 1 + 1) 27-1 MG TABS tablet    Sig: Take 1 tablet by mouth daily before breakfast.    Dispense:  90 tablet    Refill:  3     09/14/2019, MD 09/22/2019 1:49 PM

## 2019-09-23 LAB — CERVICOVAGINAL ANCILLARY ONLY
Bacterial Vaginitis (gardnerella): POSITIVE — AB
Candida Glabrata: NEGATIVE
Candida Vaginitis: NEGATIVE
Chlamydia: NEGATIVE
Comment: NEGATIVE
Comment: NEGATIVE
Comment: NEGATIVE
Comment: NEGATIVE
Comment: NEGATIVE
Comment: NORMAL
Neisseria Gonorrhea: NEGATIVE
Trichomonas: POSITIVE — AB

## 2019-09-23 LAB — CYTOLOGY - PAP
Comment: NEGATIVE
Diagnosis: NEGATIVE
High risk HPV: NEGATIVE

## 2019-09-24 ENCOUNTER — Other Ambulatory Visit: Payer: Self-pay | Admitting: Obstetrics

## 2019-09-24 DIAGNOSIS — B9689 Other specified bacterial agents as the cause of diseases classified elsewhere: Secondary | ICD-10-CM

## 2019-09-24 DIAGNOSIS — A599 Trichomoniasis, unspecified: Secondary | ICD-10-CM

## 2019-09-24 MED ORDER — METRONIDAZOLE 500 MG PO TABS: 500.0000 mg | ORAL_TABLET | Freq: Two times a day (BID) | ORAL | 0 refills | Status: DC

## 2019-12-08 ENCOUNTER — Other Ambulatory Visit: Payer: Self-pay

## 2019-12-08 MED ORDER — METRONIDAZOLE 500 MG PO TABS: 500.0000 mg | ORAL_TABLET | Freq: Two times a day (BID) | ORAL | 1 refills | Status: DC

## 2019-12-08 NOTE — Telephone Encounter (Signed)
Patient is requesting a refill on flagyl. She reported her and partner did not wait the full 10-14 days and she miss some pills. I have advised patient it is very important to finish all medication and abstain from having intercourse. I will go ahead and send in another refill for partner. She was advised to follow up in four weeks for a toc.

## 2020-01-08 ENCOUNTER — Other Ambulatory Visit: Payer: Self-pay

## 2020-01-08 ENCOUNTER — Other Ambulatory Visit (HOSPITAL_COMMUNITY)
Admission: RE | Admit: 2020-01-08 | Discharge: 2020-01-08 | Disposition: A | Discharge status: Home / Self Care | Payer: 59 | Source: Ambulatory Visit | Attending: Obstetrics | Admitting: Obstetrics

## 2020-01-08 ENCOUNTER — Ambulatory Visit (INDEPENDENT_AMBULATORY_CARE_PROVIDER_SITE_OTHER): Payer: 59

## 2020-01-08 VITALS — BP 105/69 | HR 61 | Ht 66.0 in | Wt 139.0 lb

## 2020-01-08 DIAGNOSIS — A599 Trichomoniasis, unspecified: Secondary | ICD-10-CM | POA: Diagnosis not present

## 2020-01-08 DIAGNOSIS — N760 Acute vaginitis: Secondary | ICD-10-CM | POA: Insufficient documentation

## 2020-01-08 DIAGNOSIS — B9689 Other specified bacterial agents as the cause of diseases classified elsewhere: Secondary | ICD-10-CM | POA: Insufficient documentation

## 2020-01-08 NOTE — Progress Notes (Signed)
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  Denies discharge. Partner has been treated.  PLAN Self swab done and sent to lab. Orders per results

## 2020-01-08 NOTE — Progress Notes (Signed)
Agree with A & P. 

## 2020-01-09 LAB — CERVICOVAGINAL ANCILLARY ONLY
Bacterial Vaginitis (gardnerella): NEGATIVE
Comment: NEGATIVE
Comment: NEGATIVE
Trichomonas: NEGATIVE

## 2020-01-31 NOTE — L&D Delivery Note (Signed)
[facility]N [facility] Delivery Note  Kristi [name] is a 35 y.o. G2P1001 s/p vag del at [redacted]w[redacted]d. She was admitted for IOL due to decreased fetal movement and polyhydramnios.   ROM: 4h 26m with clear fluid GBS Status: neg Maximum Maternal Temperature: 98.7  Labor Progress: [name] presented to MAU for eval of vag bleeding and decreased fetal movement and was noted to have an AFI of 29cm (polyhydramnios). She was admitted for induction of labor however required ECV as fetal position was variable- transverse w head to mat left. She had cytotec x 1 dose followed by AROM prior to progressing to complete dilation.  Delivery Date/Time: October 04, 2020 at 1101 Delivery: Called to room and patient was complete and pushing. Head delivered LOA. No nuchal cord present. Shoulder and body delivered in usual fashion. Infant with spontaneous cry, placed on mother's abdomen, dried and stimulated. Cord clamped x 2 after 1-minute delay, and cut by FOB. Cord blood drawn. Placenta delivered spontaneously with gentle cord traction. Fundus firm with massage and Pitocin. Labia, perineum, vagina, and cervix inspected and found to be intact.   Placenta: spont, intact; to L&D Complications: none Lacerations: none EBL: 200cc Analgesia: epidural  Postpartum Planning [x]  message to sent to schedule follow-up    Infant: girl  APGARs 9/9  3700g (8lb 2.5oz)  11/9, CNM  10/04/2020 11:22 AM

## 2020-02-05 ENCOUNTER — Other Ambulatory Visit: Payer: Self-pay

## 2020-02-05 ENCOUNTER — Ambulatory Visit (INDEPENDENT_AMBULATORY_CARE_PROVIDER_SITE_OTHER): Payer: 59

## 2020-02-05 DIAGNOSIS — N912 Amenorrhea, unspecified: Secondary | ICD-10-CM | POA: Diagnosis not present

## 2020-02-05 DIAGNOSIS — Z3490 Encounter for supervision of normal pregnancy, unspecified, unspecified trimester: Secondary | ICD-10-CM | POA: Insufficient documentation

## 2020-02-05 LAB — POCT URINE PREGNANCY: Preg Test, Ur: POSITIVE — AB

## 2020-02-05 NOTE — Progress Notes (Signed)
Ms. Ponciano presents today for UPT. She has no unusual complaints. LMP: 12/31/2019    OBJECTIVE: Appears well, in no apparent distress.  OB History    Gravida  1   Para  1   Term  1   Preterm      AB      Living  1     SAB      IAB      Ectopic      Multiple  0   Live Births  1        Obstetric Comments  D&E on 07/20/17 (8 weeks PP) for RPOCs       Home UPT Result: Positive  In-Office UPT result: Positive  I have reviewed the patient's medical, obstetrical, social, and family histories, and medications.   ASSESSMENT: Positive pregnancy test  PLAN Prenatal care to be completed at: [facility] Intake completed [facility] Labs to be done at [facility] work up B/P Cuff Given PhQ-9 = 0

## 2020-02-06 NOTE — Progress Notes (Signed)
Patient was assessed and managed by nursing staff during this encounter. I have reviewed the chart and agree with the documentation and plan. I have also made any necessary editorial changes.  Catalina Antigua, MD 02/06/2020 11:07 AM

## 2020-02-27 ENCOUNTER — Other Ambulatory Visit: Payer: Self-pay

## 2020-02-27 ENCOUNTER — Ambulatory Visit (INDEPENDENT_AMBULATORY_CARE_PROVIDER_SITE_OTHER): Payer: 59

## 2020-02-27 DIAGNOSIS — Z789 Other specified health status: Secondary | ICD-10-CM | POA: Diagnosis not present

## 2020-02-27 DIAGNOSIS — O3680X0 Pregnancy with inconclusive fetal viability, not applicable or unspecified: Secondary | ICD-10-CM

## 2020-02-27 DIAGNOSIS — Z3490 Encounter for supervision of normal pregnancy, unspecified, unspecified trimester: Secondary | ICD-10-CM

## 2020-02-27 NOTE — Progress Notes (Signed)
DATING AND VIABILITY SONOGRAM   Kristi [name] is a 35 y.o. year old G2P1001 with LMP Patient's last menstrual period was 12/31/2019 (exact date). which would correlate to  Unknown weeks gestation.  She has regular menstrual cycles.   She is here today for a confirmatory initial sonogram.    Two Yolk Sacs Identified. One fetal pole.     FETAL ACTIVITY:          Heart rate    153              The fetus is inactive  Gestational criteria: Estimated Date of Delivery: None noted. by LMP now at Unknown  Previous Scans:0  GESTATIONAL SAC                    CROWN RUMP LENGTH                  8 weeks                                                                               AVERAGE EGA(BY THIS SCAN):  8 weeks  WORKING EDD( LMP ):  10/06/20     TECHNICIAN COMMENTS:  Two yolk sacs identified. One fetal pole. FHR 153. Patient scheduled for confirmatory u/s at [facility] per MD.   A copy of this report including all images has been saved and backed up to a second source for retrieval if needed. All measures and details of the anatomical scan, placentation, fluid volume and pelvic anatomy are contained in that report.  Kristi [name] 02/27/2020 9:27 AM

## 2020-02-27 NOTE — Progress Notes (Signed)
Patient was assessed and managed by nursing staff during this encounter. I have reviewed the chart and agree with the documentation and plan. I have also made any necessary editorial changes.  Catalina Antigua, MD 02/27/2020 11:19 AM

## 2020-03-09 ENCOUNTER — Other Ambulatory Visit: Payer: Self-pay | Admitting: Obstetrics and Gynecology

## 2020-03-09 ENCOUNTER — Telehealth: Payer: Self-pay | Admitting: Medical

## 2020-03-09 ENCOUNTER — Other Ambulatory Visit: Payer: Self-pay

## 2020-03-09 ENCOUNTER — Ambulatory Visit
Admission: RE | Admit: 2020-03-09 | Discharge: 2020-03-09 | Disposition: A | Discharge status: Home / Self Care | Payer: 59 | Source: Ambulatory Visit | Attending: Obstetrics and Gynecology | Admitting: Obstetrics and Gynecology

## 2020-03-09 DIAGNOSIS — Z789 Other specified health status: Secondary | ICD-10-CM

## 2020-03-09 DIAGNOSIS — Z3490 Encounter for supervision of normal pregnancy, unspecified, unspecified trimester: Secondary | ICD-10-CM | POA: Diagnosis not present

## 2020-03-09 DIAGNOSIS — O3680X0 Pregnancy with inconclusive fetal viability, not applicable or unspecified: Secondary | ICD-10-CM

## 2020-03-09 DIAGNOSIS — Z3A16 16 weeks gestation of pregnancy: Secondary | ICD-10-CM | POA: Diagnosis not present

## 2020-03-09 NOTE — Telephone Encounter (Signed)
I called [facility] today at 4:38 PM and confirmed patient's identity using two patient identifiers. [place] results from earlier today were reviewed. Patient is scheduled for her next OB visit at [facility] on 03/22/20. First trimester warning signs reviewed. Patient voiced understanding and had no further questions.   US OB Comp Less 14 Wks  Result Date: 03/09/2020 CLINICAL DATA:  Assess fetal viability, 2 yolk sacs identified on in office ultrasound EXAM: OBSTETRIC <14 WK ULTRASOUND TECHNIQUE: Transabdominal ultrasound was performed for evaluation of the gestation as well as the maternal uterus and adnexal regions. COMPARISON:  02/27/2020 FINDINGS: Intrauterine gestational sac: Single Yolk sac:  Single yolk sac visualized Embryo:  Visualized. Cardiac Activity: Visualized. Heart Rate: 173 bpm CRL: 33.1 mm   16 w 1 d                  [place] EDC: 10/04/2020 Subchorionic hemorrhage:  None visualized. Maternal uterus/adnexae: Left ovary measures 2.9 x 1.8 x 1.9 cm in the right ovary measures 2.5 x 3.2 x 2.0 cm. No adnexal mass or free fluid. IMPRESSION: 1. Single live intrauterine pregnancy as above, estimated age 10 weeks and 1 day. Normal appearing single yolk sac identified. Electronically Signed   By: Sharlet Salina M.D.   On: 03/09/2020 16:21   [facility] Radiology to confirm dating since [redacted]w[redacted]d does not correlate with [facility] 10/04/20. Per Dr. Manson Passey, [redacted]w[redacted]d was a typo and he will addend to show correct dating of [redacted]w[redacted]d.   Vonzella Nipple, PA-C 03/09/2020 4:38 PM

## 2020-03-18 ENCOUNTER — Encounter: Payer: 59 | Admitting: Obstetrics

## 2020-03-18 ENCOUNTER — Encounter: Payer: 59 | Admitting: Obstetrics and Gynecology

## 2020-03-22 ENCOUNTER — Ambulatory Visit (INDEPENDENT_AMBULATORY_CARE_PROVIDER_SITE_OTHER): Payer: 59

## 2020-03-22 ENCOUNTER — Encounter: Payer: Self-pay | Admitting: Obstetrics

## 2020-03-22 ENCOUNTER — Other Ambulatory Visit (HOSPITAL_COMMUNITY)
Admission: RE | Admit: 2020-03-22 | Discharge: 2020-03-22 | Disposition: A | Discharge status: Home / Self Care | Payer: 59 | Source: Ambulatory Visit | Attending: Obstetrics | Admitting: Obstetrics

## 2020-03-22 ENCOUNTER — Other Ambulatory Visit: Payer: Self-pay

## 2020-03-22 ENCOUNTER — Ambulatory Visit (INDEPENDENT_AMBULATORY_CARE_PROVIDER_SITE_OTHER): Payer: 59 | Admitting: Obstetrics

## 2020-03-22 VITALS — BP 116/75 | HR 99 | Wt 150.0 lb

## 2020-03-22 DIAGNOSIS — Z3A11 11 weeks gestation of pregnancy: Secondary | ICD-10-CM | POA: Diagnosis not present

## 2020-03-22 DIAGNOSIS — Z31430 Encounter of female for testing for genetic disease carrier status for procreative management: Secondary | ICD-10-CM | POA: Diagnosis not present

## 2020-03-22 DIAGNOSIS — Z3480 Encounter for supervision of other normal pregnancy, unspecified trimester: Secondary | ICD-10-CM | POA: Diagnosis not present

## 2020-03-22 DIAGNOSIS — Z3169 Encounter for other general counseling and advice on procreation: Secondary | ICD-10-CM

## 2020-03-22 DIAGNOSIS — Z3481 Encounter for supervision of other normal pregnancy, first trimester: Secondary | ICD-10-CM | POA: Diagnosis not present

## 2020-03-22 DIAGNOSIS — O368390 Maternal care for abnormalities of the fetal heart rate or rhythm, unspecified trimester, not applicable or unspecified: Secondary | ICD-10-CM

## 2020-03-22 MED ORDER — PRENATAL PLUS 27-1 MG PO TABS: 1.0000 | ORAL_TABLET | Freq: Every day | ORAL | 3 refills | Status: DC

## 2020-03-22 NOTE — Progress Notes (Signed)
Subjective:    Kristi [name] is being seen today for her first obstetrical visit.  This is not a planned pregnancy. She is at [redacted]w[redacted]d gestation. Her obstetrical history is significant for none. Relationship with FOB: significant other, living together. Patient does intend to breast feed. Pregnancy history fully reviewed.  The information documented in the HPI was reviewed and verified.  Menstrual History: OB History    Gravida  2   Para  1   Term  1   Preterm      AB      Living  1     SAB      IAB      Ectopic      Multiple  0   Live Births  1        Obstetric Comments  D&E on 07/20/17 (8 weeks PP) for RPOCs         Patient's last menstrual period was 12/31/2019 (exact date).    Past Medical History:  Diagnosis Date  . Pap smear abnormality of cervix     Past Surgical History:  Procedure Laterality Date  . COLPOSCOPY  2013-2014  . DILATION AND EVACUATION N/A 07/20/2017   Procedure: DILATATION AND EVACUATION;  Surgeon: Tereso Newcomer, MD;  Location: [facility];  Service: Gynecology;  Laterality: N/A;  . SIGMOIDOSCOPY      (Not in a hospital admission)  Allergies  Allergen Reactions  . Adhesive [Tape] Rash    Social History   Tobacco Use  . Smoking status: Never Smoker  . Smokeless tobacco: Never Used  Substance Use Topics  . Alcohol use: Not Currently    Family History  Problem Relation Age of Onset  . Hypertension Mother   . Stroke Mother        Crist Infante  . Hypertension Father   . Cancer Maternal Grandfather   . Cancer Paternal Grandmother        ovarian  . Diabetes Paternal Grandmother      Review of Systems Constitutional: negative for weight loss Gastrointestinal: negative for vomiting Genitourinary:negative for genital lesions and vaginal discharge and dysuria Musculoskeletal:negative for back pain Behavioral/Psych: negative for abusive relationship, depression, illegal drug usage and tobacco use    Objective:    BP 116/75   Pulse 99    Wt 150 lb (68 kg)   LMP 12/31/2019 (Exact Date)   BMI 24.21 kg/m  General Appearance:    Alert, cooperative, no distress, appears stated age  Head:    Normocephalic, without obvious abnormality, atraumatic  Eyes:    PERRL, conjunctiva/corneas clear, EOM's intact, fundi    benign, both eyes  Ears:    Normal TM's and external ear canals, both ears  Nose:   Nares normal, septum midline, mucosa normal, no drainage    or sinus tenderness  Throat:   Lips, mucosa, and tongue normal; teeth and gums normal  Neck:   Supple, symmetrical, trachea midline, no adenopathy;    thyroid:  no enlargement/tenderness/nodules; no carotid   bruit or JVD  Back:     Symmetric, no curvature, ROM normal, no CVA tenderness  Lungs:     Clear to auscultation bilaterally, respirations unlabored  Chest Wall:    No tenderness or deformity   Heart:    Regular rate and rhythm, S1 and S2 normal, no murmur, rub   or gallop  Breast Exam:    No tenderness, masses, or nipple abnormality  Abdomen:     Soft, non-tender, bowel sounds active all  four quadrants,    no masses, no organomegaly  Genitalia:    Normal female without lesion, discharge or tenderness  Extremities:   Extremities normal, atraumatic, no cyanosis or edema  Pulses:   2+ and symmetric all extremities  Skin:   Skin color, texture, turgor normal, no rashes or lesions  Lymph nodes:   Cervical, supraclavicular, and axillary nodes normal  Neurologic:   CNII-XII intact, normal strength, sensation and reflexes    throughout      Lab Review Urine pregnancy test Labs reviewed yes Radiologic studies reviewed yes  Assessment:    Pregnancy at [redacted]w[redacted]d weeks    Plan:     1. Supervision of other normal pregnancy, antepartum Rx: - Cervicovaginal ancillary only( [place]) - Culture, OB Urine - CBC/D/Plt+RPR+Rh+ABO+Rub Ab... - [place] MFM OB COMP + 14 WK; Future - Enroll patient in Babyscripts Program - Babyscripts Schedule Optimization  2. Unable to hear  fetal heart tones as reason for ultrasound scan Rx: - US OB Limited: FHR = 153 bpm   Prenatal vitamins.  Counseling provided regarding continued use of seat belts, cessation of alcohol consumption, smoking or use of illicit drugs; infection precautions i.e., influenza/TDAP immunizations, toxoplasmosis,CMV, parvovirus, listeria and varicella; workplace safety, exercise during pregnancy; routine dental care, safe medications, sexual activity, hot tubs, saunas, pools, travel, caffeine use, fish and methlymercury, potential toxins, hair treatments, varicose veins Weight gain recommendations per IOM guidelines reviewed: underweight/BMI< 18.5--> gain 28 - 40 lbs; normal weight/BMI 18.5 - 24.9--> gain 25 - 35 lbs; overweight/BMI 25 - 29.9--> gain 15 - 25 lbs; obese/BMI >30->gain  11 - 20 lbs Problem list reviewed and updated. FIRST/CF mutation testing/NIPT/QUAD SCREEN/fragile X/Ashkenazi Jewish population testing/Spinal muscular atrophy discussed: requested. Role of ultrasound in pregnancy discussed; fetal survey: requested. Amniocentesis discussed: not indicated.   Orders Placed This Encounter  Procedures  . Culture, OB Urine  . [place] MFM OB COMP + 14 WK    Standing Status:   Future    Standing Expiration Date:   03/19/2021    Order Specific Question:   Reason for Exam (SYMPTOM  OR DIAGNOSIS REQUIRED)    Answer:   Anatomy    Order Specific Question:   Preferred Location    Answer:   [facility] Ultrasound  . US OB Limited    Standing Status:   Future    Number of Occurrences:   1    Standing Expiration Date:   03/22/2021    Order Specific Question:   Reason for Exam (SYMPTOM  OR DIAGNOSIS REQUIRED)    Answer:   Unable to hear heart tone    Order Specific Question:   Preferred Imaging Location?    Answer:   External  . CBC/D/Plt+RPR+Rh+ABO+Rub Ab...    Follow up in 4 weeks. 50% of 20 min visit spent on counseling and coordination of care.    [name], MD 03/22/2020 4:00 PM

## 2020-03-22 NOTE — Progress Notes (Signed)
New OB, reports no problems today  

## 2020-03-23 LAB — CERVICOVAGINAL ANCILLARY ONLY
Bacterial Vaginitis (gardnerella): NEGATIVE
Candida Glabrata: NEGATIVE
Candida Vaginitis: NEGATIVE
Chlamydia: NEGATIVE
Comment: NEGATIVE
Comment: NEGATIVE
Comment: NEGATIVE
Comment: NEGATIVE
Comment: NEGATIVE
Comment: NORMAL
Neisseria Gonorrhea: NEGATIVE
Trichomonas: NEGATIVE

## 2020-03-24 ENCOUNTER — Other Ambulatory Visit: Payer: Self-pay | Admitting: Obstetrics

## 2020-03-24 DIAGNOSIS — O361990 Maternal care for other isoimmunization, unspecified trimester, not applicable or unspecified: Secondary | ICD-10-CM

## 2020-03-24 LAB — CBC/D/PLT+RPR+RH+ABO+RUB AB...
Basophils Absolute: 0 10*3/uL (ref 0.0–0.2)
Basos: 0 %
EOS (ABSOLUTE): 0.1 10*3/uL (ref 0.0–0.4)
Eos: 1 %
HCV Ab: <0.1 s/co ratio (ref 0.0–0.9)
HIV Screen 4th Generation wRfx: NONREACTIVE
Hematocrit: 33.4 % — ABNORMAL LOW (ref 34.0–46.6)
Hemoglobin: 11.6 g/dL (ref 11.1–15.9)
Hepatitis B Surface Ag: NEGATIVE
Immature Grans (Abs): 0.1 10*3/uL (ref 0.0–0.1)
Immature Granulocytes: 2 %
Lymphocytes Absolute: 1.7 10*3/uL (ref 0.7–3.1)
Lymphs: 23 %
MCH: 31.6 pg (ref 26.6–33.0)
MCHC: 34.7 g/dL (ref 31.5–35.7)
MCV: 91 fL (ref 79–97)
Monocytes Absolute: 0.5 10*3/uL (ref 0.1–0.9)
Monocytes: 7 %
Neutrophils Absolute: 4.8 10*3/uL (ref 1.4–7.0)
Neutrophils: 67 %
Platelets: 231 10*3/uL (ref 150–450)
RBC: 3.67 x10E6/uL — ABNORMAL LOW (ref 3.77–5.28)
RDW: 12.2 % (ref 11.7–15.4)
RPR Ser Ql: NONREACTIVE
Rh Factor: NEGATIVE
Rubella Antibodies, IGG: 2.81 index (ref >0.99)
WBC: 7.2 10*3/uL (ref 3.4–10.8)

## 2020-03-24 LAB — AB SCR+ANTIBODY ID: Antibody Screen: POSITIVE — AB

## 2020-03-24 LAB — HCV INTERPRETATION

## 2020-03-25 LAB — URINE CULTURE, OB REFLEX

## 2020-03-25 LAB — CULTURE, OB URINE

## 2020-03-29 ENCOUNTER — Encounter: Payer: Self-pay | Admitting: Obstetrics

## 2020-03-31 DIAGNOSIS — Z20822 Contact with and (suspected) exposure to covid-19: Secondary | ICD-10-CM | POA: Diagnosis not present

## 2020-04-02 ENCOUNTER — Encounter: Payer: Self-pay | Admitting: Obstetrics

## 2020-04-26 ENCOUNTER — Encounter: Payer: Self-pay | Admitting: Obstetrics and Gynecology

## 2020-04-26 ENCOUNTER — Ambulatory Visit (INDEPENDENT_AMBULATORY_CARE_PROVIDER_SITE_OTHER): Payer: 59 | Admitting: Obstetrics and Gynecology

## 2020-04-26 ENCOUNTER — Other Ambulatory Visit: Payer: Self-pay

## 2020-04-26 VITALS — BP 107/70 | HR 83 | Wt 156.0 lb

## 2020-04-26 DIAGNOSIS — Z3490 Encounter for supervision of normal pregnancy, unspecified, unspecified trimester: Secondary | ICD-10-CM

## 2020-04-26 NOTE — Progress Notes (Signed)
   PRENATAL VISIT NOTE  Subjective:  Kristi [name] is a 35 y.o. G2P1001 at [redacted]w[redacted]d being seen today for ongoing prenatal care.  She is currently monitored for the following issues for this low-risk pregnancy and has Retained products of conception, following delivery with hemorrhage; Hemorrhage of rectum and anus; ASCUS favor benign; and Supervision of normal pregnancy, antepartum on their problem list.  Patient reports round ligament pains.  Contractions: Not present. Vag. Bleeding: None.  Movement: Present. Denies leaking of fluid.   The following portions of the patient's history were reviewed and updated as appropriate: allergies, current medications, past family history, past medical history, past social history, past surgical history and problem list.   Objective:   Vitals:   04/26/20 1318  BP: 107/70  Pulse: 83  Weight: 156 lb (70.8 kg)    Fetal Status: Fetal Heart Rate (bpm): 150   Movement: Present     General:  Alert, oriented and cooperative. Patient is in no acute distress.  Skin: Skin is warm and dry. No rash noted.   Cardiovascular: Normal heart rate noted  Respiratory: Normal respiratory effort, no problems with respiration noted  Abdomen: Soft, gravid, appropriate for gestational age.  Pain/Pressure: Absent     Pelvic: Cervical exam deferred        Extremities: Normal range of motion.     Mental Status: Normal mood and affect. Normal behavior. Normal judgment and thought content.   Assessment and Plan:  Pregnancy: G2P1001 at [redacted]w[redacted]d 1. Encounter for supervision of normal pregnancy, antepartum, unspecified gravidity Patient is doing well without complaints Reassurance provided regarding round ligament pain AFP today Anatomy ultrasound scheduled  Preterm labor symptoms and general obstetric precautions including but not limited to vaginal bleeding, contractions, leaking of fluid and fetal movement were reviewed in detail with the patient. Please refer to After Visit  Summary for other counseling recommendations.   Return in about 4 weeks (around 05/24/2020) for in person, Low risk, ROB.  Future Appointments  Date Time Provider Department Center  04/26/2020  2:15 PM Katelan Hirt, Gigi Gin, MD [facility] None  05/12/2020  2:15 PM [facility]    Catalina Antigua, MD

## 2020-04-26 NOTE — Progress Notes (Signed)
Pt is having some right lower pelvic pain.

## 2020-04-28 LAB — AFP, SERUM, OPEN SPINA BIFIDA
AFP MoM: 0.99
AFP Value: 39.6 ng/mL
Gest. Age on Collection Date: 16.5 weeks
Maternal Age At EDD: 33.7 yr
OSBR Risk 1 IN: 10000
Test Results:: NEGATIVE
Weight: 156 [lb_av]

## 2020-05-12 ENCOUNTER — Other Ambulatory Visit: Payer: 59

## 2020-05-18 ENCOUNTER — Other Ambulatory Visit: Payer: Self-pay | Admitting: Obstetrics

## 2020-05-18 DIAGNOSIS — O360120 Maternal care for anti-D [Rh] antibodies, second trimester, not applicable or unspecified: Secondary | ICD-10-CM

## 2020-05-18 DIAGNOSIS — Z3A20 20 weeks gestation of pregnancy: Secondary | ICD-10-CM

## 2020-05-21 ENCOUNTER — Ambulatory Visit: Payer: 59 | Attending: Obstetrics

## 2020-05-21 ENCOUNTER — Other Ambulatory Visit: Payer: Self-pay | Admitting: *Deleted

## 2020-05-21 ENCOUNTER — Other Ambulatory Visit: Payer: Self-pay

## 2020-05-21 DIAGNOSIS — O360120 Maternal care for anti-D [Rh] antibodies, second trimester, not applicable or unspecified: Secondary | ICD-10-CM | POA: Diagnosis not present

## 2020-05-21 DIAGNOSIS — O4440 Low lying placenta NOS or without hemorrhage, unspecified trimester: Secondary | ICD-10-CM | POA: Insufficient documentation

## 2020-05-21 DIAGNOSIS — O361990 Maternal care for other isoimmunization, unspecified trimester, not applicable or unspecified: Secondary | ICD-10-CM

## 2020-05-21 DIAGNOSIS — Z3A20 20 weeks gestation of pregnancy: Secondary | ICD-10-CM | POA: Insufficient documentation

## 2020-05-23 NOTE — Progress Notes (Unsigned)
MFM Brief Note  Single intrauterine pregnancy here for a detailed anatomy with an Anti-M antibody. Normal anatomy with measurements consistent with dates There is good fetal movement and amniotic fluid volume  She has a low lying placenta that was 1.3 cm from the internal os. A low lying placenta, is where the placental edge lies 2.0cm or closer to the internal os, was noted on review of today's imaging. Although no activity restriction is necessary, this finding can be associated with an increased risk of bleeding, and your patient should be counseled as such. The patient can be reassured that this condition has a high rate of spontaneous resolution, although should this condition persist, a cesarean delivery may be necessary.   Today's sonogram demonstrates no evidence of invasive placentation.   A follow up exam is recommended in 6-8 weeks.    I spent 15 minutes with > 50% in face to face consultation.  Novella Olive, MD

## 2020-05-25 ENCOUNTER — Other Ambulatory Visit: Payer: Self-pay

## 2020-05-25 ENCOUNTER — Encounter: Payer: Self-pay | Admitting: Obstetrics and Gynecology

## 2020-05-25 ENCOUNTER — Ambulatory Visit (INDEPENDENT_AMBULATORY_CARE_PROVIDER_SITE_OTHER): Payer: 59 | Admitting: Obstetrics and Gynecology

## 2020-05-25 VITALS — BP 114/75 | HR 93 | Wt 157.0 lb

## 2020-05-25 DIAGNOSIS — Z3480 Encounter for supervision of other normal pregnancy, unspecified trimester: Secondary | ICD-10-CM

## 2020-05-25 MED ORDER — COMFORT FIT MATERNITY SUPP MED MISC: 0 refills | Status: DC

## 2020-05-25 NOTE — Progress Notes (Signed)
   PRENATAL VISIT NOTE  Subjective:  Kristi [name] is a 35 y.o. G2P1001 at [redacted]w[redacted]d being seen today for ongoing prenatal care.  She is currently monitored for the following issues for this low-risk pregnancy and has Retained products of conception, following delivery with hemorrhage; Hemorrhage of rectum and anus; ASCUS favor benign; and Supervision of normal pregnancy, antepartum on their problem list.  Patient reports backache.  Contractions: Not present. Vag. Bleeding: None.  Movement: Present. Denies leaking of fluid.   The following portions of the patient's history were reviewed and updated as appropriate: allergies, current medications, past family history, past medical history, past social history, past surgical history and problem list.   Objective:   Vitals:   05/25/20 0853  BP: 114/75  Pulse: 93  Weight: 157 lb (71.2 kg)    Fetal Status: Fetal Heart Rate (bpm): 156 Fundal Height: 20 cm Movement: Present     General:  Alert, oriented and cooperative. Patient is in no acute distress.  Skin: Skin is warm and dry. No rash noted.   Cardiovascular: Normal heart rate noted  Respiratory: Normal respiratory effort, no problems with respiration noted  Abdomen: Soft, gravid, appropriate for gestational age.  Pain/Pressure: Present     Pelvic: Cervical exam deferred        Extremities: Normal range of motion.  Edema: None  Mental Status: Normal mood and affect. Normal behavior. Normal judgment and thought content.   Assessment and Plan:  Pregnancy: G2P1001 at [redacted]w[redacted]d 1. 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 Please refer to After Visit Summary for other counseling recommendations.   Return in about 4 weeks (around 06/22/2020) for in person, ROB, Low risk.  Future Appointments  Date Time Provider Department Center  06/21/2020 10:45 AM Hurshel Party, CNM [facility] None  07/05/2020 11:00 AM [facility]URSE [facility]  07/05/2020 11:15 AM [facility]2 [facility]    Catalina Antigua, MD

## 2020-05-25 NOTE — Progress Notes (Signed)
ROB  [redacted]w[redacted]d AFP WNL  CC: Pain and discomfort all the time.

## 2020-06-21 ENCOUNTER — Other Ambulatory Visit: Payer: Self-pay

## 2020-06-21 ENCOUNTER — Ambulatory Visit (INDEPENDENT_AMBULATORY_CARE_PROVIDER_SITE_OTHER): Payer: 59 | Admitting: Family Medicine

## 2020-06-21 VITALS — BP 100/59 | HR 59 | Wt 164.0 lb

## 2020-06-21 DIAGNOSIS — Z3480 Encounter for supervision of other normal pregnancy, unspecified trimester: Secondary | ICD-10-CM

## 2020-06-21 NOTE — Progress Notes (Signed)
OB c/o pelvic pressure like something is trying to come out.

## 2020-06-21 NOTE — Patient Instructions (Signed)

## 2020-06-21 NOTE — Progress Notes (Signed)
   PRENATAL VISIT NOTE  Subjective:  Kristi [name] is a 35 y.o. G2P1001 at [redacted]w[redacted]d being seen today for ongoing prenatal care.  She is currently monitored for the following issues for this low-risk pregnancy and has Retained products of conception, following delivery with hemorrhage; Hemorrhage of rectum and anus; ASCUS favor benign; and Supervision of normal pregnancy, antepartum on their problem list.  Patient reports cough related to recent flu and pelvic pressure with bladder leakage.  Contractions: Not present. Vag. Bleeding: None.  Movement: Present. Denies leaking of fluid.   The following portions of the patient's history were reviewed and updated as appropriate: allergies, current medications, past family history, past medical history, past social history, past surgical history and problem list.   Objective:   Vitals:   06/21/20 1118  BP: (!) 100/59  Pulse: (!) 59  Weight: 164 lb (74.4 kg)    Fetal Status: Fetal Heart Rate (bpm): 152   Movement: Present     General:  Alert, oriented and cooperative. Patient is in no acute distress.  Skin: Skin is warm and dry. No rash noted.   Cardiovascular: Normal heart rate noted  Respiratory: Normal respiratory effort, no problems with respiration noted  Abdomen: Soft, gravid, appropriate for gestational age.  Pain/Pressure: Present     Pelvic: Cervical exam deferred        Extremities: Normal range of motion.  Edema: Trace  Mental Status: Normal mood and affect. Normal behavior. Normal judgment and thought content.   Assessment and Plan:  Pregnancy: G2P1001 at [redacted]w[redacted]d 1. Supervision of other normal pregnancy, antepartum Has some stress incontinence and bulge in vagina--? related to cystocele Maternity belt. Add Kegel's. Usual course of flu and cough disucssed  Preterm labor symptoms and general obstetric precautions including but not limited to vaginal bleeding, contractions, leaking of fluid and fetal movement were reviewed in detail  with the patient. Please refer to After Visit Summary for other counseling recommendations.   Return in 4 weeks (on 07/19/2020) for 28 wk labs.  Future Appointments  Date Time Provider Department Center  07/05/2020 11:00 AM [facility]URSE [facility]  07/05/2020 11:15 AM [facility]2 [facility]  07/19/2020  8:30 AM [facility] LAB [facility] None  07/19/2020  8:55 AM Leftwich-Kirby, Wilmer Floor, CNM [facility] None    Reva Bores, MD

## 2020-07-05 ENCOUNTER — Encounter: Payer: Self-pay | Admitting: *Deleted

## 2020-07-05 ENCOUNTER — Other Ambulatory Visit: Payer: Self-pay

## 2020-07-05 ENCOUNTER — Ambulatory Visit: Payer: 59 | Attending: Maternal & Fetal Medicine

## 2020-07-05 ENCOUNTER — Ambulatory Visit: Payer: 59 | Admitting: *Deleted

## 2020-07-05 VITALS — BP 111/57 | HR 77

## 2020-07-05 DIAGNOSIS — Z3A26 26 weeks gestation of pregnancy: Secondary | ICD-10-CM | POA: Diagnosis not present

## 2020-07-05 DIAGNOSIS — O09292 Supervision of pregnancy with other poor reproductive or obstetric history, second trimester: Secondary | ICD-10-CM

## 2020-07-05 DIAGNOSIS — Z362 Encounter for other antenatal screening follow-up: Secondary | ICD-10-CM | POA: Diagnosis not present

## 2020-07-05 DIAGNOSIS — O360920 Maternal care for other rhesus isoimmunization, second trimester, not applicable or unspecified: Secondary | ICD-10-CM | POA: Diagnosis not present

## 2020-07-05 DIAGNOSIS — O4440 Low lying placenta NOS or without hemorrhage, unspecified trimester: Secondary | ICD-10-CM

## 2020-07-05 DIAGNOSIS — O361990 Maternal care for other isoimmunization, unspecified trimester, not applicable or unspecified: Secondary | ICD-10-CM | POA: Insufficient documentation

## 2020-07-05 DIAGNOSIS — O322XX0 Maternal care for transverse and oblique lie, not applicable or unspecified: Secondary | ICD-10-CM | POA: Diagnosis not present

## 2020-07-05 DIAGNOSIS — O4402 Placenta previa specified as without hemorrhage, second trimester: Secondary | ICD-10-CM | POA: Diagnosis not present

## 2020-07-19 ENCOUNTER — Ambulatory Visit (INDEPENDENT_AMBULATORY_CARE_PROVIDER_SITE_OTHER): Payer: 59 | Admitting: Advanced Practice Midwife

## 2020-07-19 ENCOUNTER — Other Ambulatory Visit: Payer: Self-pay

## 2020-07-19 ENCOUNTER — Other Ambulatory Visit (HOSPITAL_COMMUNITY)
Admission: RE | Admit: 2020-07-19 | Discharge: 2020-07-19 | Disposition: A | Discharge status: Home / Self Care | Payer: 59 | Source: Ambulatory Visit | Attending: Advanced Practice Midwife | Admitting: Advanced Practice Midwife

## 2020-07-19 ENCOUNTER — Other Ambulatory Visit: Payer: 59

## 2020-07-19 VITALS — BP 100/61 | HR 91 | Wt 170.0 lb

## 2020-07-19 DIAGNOSIS — O99891 Other specified diseases and conditions complicating pregnancy: Secondary | ICD-10-CM | POA: Insufficient documentation

## 2020-07-19 DIAGNOSIS — O361931 Maternal care for other isoimmunization, third trimester, fetus 1: Secondary | ICD-10-CM | POA: Diagnosis not present

## 2020-07-19 DIAGNOSIS — R109 Unspecified abdominal pain: Secondary | ICD-10-CM

## 2020-07-19 DIAGNOSIS — M549 Dorsalgia, unspecified: Secondary | ICD-10-CM

## 2020-07-19 DIAGNOSIS — O26899 Other specified pregnancy related conditions, unspecified trimester: Secondary | ICD-10-CM | POA: Diagnosis not present

## 2020-07-19 DIAGNOSIS — O361990 Maternal care for other isoimmunization, unspecified trimester, not applicable or unspecified: Secondary | ICD-10-CM

## 2020-07-19 DIAGNOSIS — Z9189 Other specified personal risk factors, not elsewhere classified: Secondary | ICD-10-CM

## 2020-07-19 DIAGNOSIS — O99013 Anemia complicating pregnancy, third trimester: Secondary | ICD-10-CM

## 2020-07-19 DIAGNOSIS — O26853 Spotting complicating pregnancy, third trimester: Secondary | ICD-10-CM | POA: Diagnosis not present

## 2020-07-19 DIAGNOSIS — Z3480 Encounter for supervision of other normal pregnancy, unspecified trimester: Secondary | ICD-10-CM | POA: Diagnosis not present

## 2020-07-19 DIAGNOSIS — Z3A28 28 weeks gestation of pregnancy: Secondary | ICD-10-CM | POA: Diagnosis not present

## 2020-07-19 DIAGNOSIS — O26893 Other specified pregnancy related conditions, third trimester: Secondary | ICD-10-CM

## 2020-07-19 DIAGNOSIS — Z6791 Unspecified blood type, Rh negative: Secondary | ICD-10-CM

## 2020-07-19 MED ORDER — RHO D IMMUNE GLOBULIN 1500 UNIT/2ML IJ SOSY
300.0000 ug | PREFILLED_SYRINGE | Freq: Once | INTRAMUSCULAR | Status: AC
Start: 2020-07-19 — End: 2020-07-19
  Administered 2020-07-19: 300 ug via INTRAMUSCULAR

## 2020-07-19 NOTE — Addendum Note (Signed)
Addended by: Marya Landry D on: 07/19/2020 03:25 PM   Modules accepted: Orders

## 2020-07-19 NOTE — Progress Notes (Signed)
   PRENATAL VISIT NOTE  Subjective:  Kristi [name] is a 35 y.o. G2P1001 at [redacted]w[redacted]d being seen today for ongoing prenatal care.  She is currently monitored for the following issues for this low-risk pregnancy and has Retained products of conception, following delivery with hemorrhage; Hemorrhage of rectum and anus; ASCUS favor benign; and Supervision of normal pregnancy, antepartum on their problem list.  Patient reports  epidsodes of abdominal and back pain intermittently .  Contractions: Irritability. Vag. Bleeding: None.  Movement: Present. Denies leaking of fluid.   The following portions of the patient's history were reviewed and updated as appropriate: allergies, current medications, past family history, past medical history, past social history, past surgical history and problem list.   Objective:   Vitals:   07/19/20 0854  BP: 100/61  Pulse: 91  Weight: 170 lb (77.1 kg)    Fetal Status: Fetal Heart Rate (bpm): 143 Fundal Height: 29 cm Movement: Present     General:  Alert, oriented and cooperative. Patient is in no acute distress.  Skin: Skin is warm and dry. No rash noted.   Cardiovascular: Normal heart rate noted  Respiratory: Normal respiratory effort, no problems with respiration noted  Abdomen: Soft, gravid, appropriate for gestational age.  Pain/Pressure: Present     Pelvic: Cervical exam performed in the presence of a chaperone Dilation: Closed Effacement (%): 0 Station: Ballotable  Extremities: Normal range of motion.     Mental Status: Normal mood and affect. Normal behavior. Normal judgment and thought content.   Assessment and Plan:  Pregnancy: G2P1001 at [redacted]w[redacted]d 1. Supervision of other normal pregnancy, antepartum --Anticipatory guidance about next visits/weeks of pregnancy given. --Next visit in 2 weeks  - Glucose Tolerance, 2 Hours w/1 Hour - CBC - HIV Antibody (routine testing w rflx) - RPR  2. Anti-M isoimmunization affecting pregnancy, antepartum, single  or unspecified fetus --Unlikely to cause HDN but message sent to Dr Judeth Cornfield, partner testing is sometimes recommended.  3. [redacted] weeks gestation of pregnancy   4. Rh negative state in antepartum period, third trimester  - rho (d) immune globulin (RHIG/RHOPHYLAC) injection 300 mcg  5. At risk for depression --Pt reports recently struggling with depression in the pregnancy, no hx. - Ambulatory referral to Integrated Behavioral Health  6. Back pain affecting pregnancy in third trimester --Intermittent back pain, abdominal pain she had last week in improved but back pain is uncomfortable today --Urine culture --Cervix 0/thick/high, no evidence of preterm labor  7. Abdominal pain affecting pregnancy --Had an episode of cramping that was severe last week after busy weekend --Cramping in abdomen improved but is still occurring intermittently --Urine culture and vaginal swabs today  8. Spotting affecting pregnancy in third trimester --Pt had spotting 1 week ago, none since and spotting and cramping occurred on Monday following a busy weekend including class reunion with dancing.  --Likely due to increased activity, reassuring that bleeding has stopped and pain has increased  Preterm labor symptoms and general obstetric precautions including but not limited to vaginal bleeding, contractions, leaking of fluid and fetal movement were reviewed in detail with the patient. Please refer to After Visit Summary for other counseling recommendations.   Return in about 2 weeks (around 08/02/2020).  Future Appointments  Date Time Provider Department Center  08/03/2020  8:15 AM Leftwich-Kirby, Wilmer Floor, CNM [facility] None     Sharen Counter, CNM

## 2020-07-19 NOTE — Progress Notes (Signed)
Pt states she is having increase in back pain today.   Pt had episode of spotting last Monday- not related to intercourse.  Pt denies any since. Pt also states she is having increase in cramping.   PHQ-9 score 6 today.

## 2020-07-20 ENCOUNTER — Other Ambulatory Visit: Payer: Self-pay

## 2020-07-20 ENCOUNTER — Encounter: Payer: Self-pay | Admitting: Advanced Practice Midwife

## 2020-07-20 DIAGNOSIS — O361930 Maternal care for other isoimmunization, third trimester, not applicable or unspecified: Secondary | ICD-10-CM | POA: Insufficient documentation

## 2020-07-20 DIAGNOSIS — Z3169 Encounter for other general counseling and advice on procreation: Secondary | ICD-10-CM

## 2020-07-20 LAB — CERVICOVAGINAL ANCILLARY ONLY
Bacterial Vaginitis (gardnerella): NEGATIVE
Candida Glabrata: NEGATIVE
Candida Vaginitis: NEGATIVE
Chlamydia: NEGATIVE
Comment: NEGATIVE
Comment: NEGATIVE
Comment: NEGATIVE
Comment: NEGATIVE
Comment: NEGATIVE
Comment: NORMAL
Neisseria Gonorrhea: NEGATIVE
Trichomonas: NEGATIVE

## 2020-07-20 LAB — CBC
Hematocrit: 29.4 % — ABNORMAL LOW (ref 34.0–46.6)
Hemoglobin: 10.1 g/dL — ABNORMAL LOW (ref 11.1–15.9)
MCH: 31.8 pg (ref 26.6–33.0)
MCHC: 34.4 g/dL (ref 31.5–35.7)
MCV: 93 fL (ref 79–97)
Platelets: 166 10*3/uL (ref 150–450)
RBC: 3.18 x10E6/uL — ABNORMAL LOW (ref 3.77–5.28)
RDW: 12.3 % (ref 11.7–15.4)
WBC: 7.4 10*3/uL (ref 3.4–10.8)

## 2020-07-20 LAB — GLUCOSE TOLERANCE, 2 HOURS W/ 1HR
Glucose, 1 hour: 85 mg/dL (ref 65–179)
Glucose, 2 hour: 83 mg/dL (ref 65–152)
Glucose, Fasting: 71 mg/dL (ref 65–91)

## 2020-07-20 LAB — RPR: RPR Ser Ql: NONREACTIVE

## 2020-07-20 LAB — HIV ANTIBODY (ROUTINE TESTING W REFLEX): HIV Screen 4th Generation wRfx: NONREACTIVE

## 2020-07-20 MED ORDER — FERROUS SULFATE 325 (65 FE) MG PO TABS
325.0000 mg | ORAL_TABLET | ORAL | 2 refills | Status: DC
Start: 2020-07-20 — End: 2020-11-16

## 2020-07-20 MED ORDER — PRENATAL PLUS 27-1 MG PO TABS: 1.0000 | ORAL_TABLET | Freq: Every day | ORAL | 3 refills | Status: DC

## 2020-07-20 NOTE — Addendum Note (Signed)
Addended by: Sharen Counter A on: 07/20/2020 12:37 PM   Modules accepted: Orders

## 2020-08-03 ENCOUNTER — Ambulatory Visit (INDEPENDENT_AMBULATORY_CARE_PROVIDER_SITE_OTHER): Payer: 59 | Admitting: Advanced Practice Midwife

## 2020-08-03 ENCOUNTER — Other Ambulatory Visit: Payer: Self-pay

## 2020-08-03 VITALS — BP 108/67 | HR 95 | Wt 172.6 lb

## 2020-08-03 DIAGNOSIS — Z6791 Unspecified blood type, Rh negative: Secondary | ICD-10-CM

## 2020-08-03 DIAGNOSIS — O99013 Anemia complicating pregnancy, third trimester: Secondary | ICD-10-CM

## 2020-08-03 DIAGNOSIS — R109 Unspecified abdominal pain: Secondary | ICD-10-CM

## 2020-08-03 DIAGNOSIS — O26893 Other specified pregnancy related conditions, third trimester: Secondary | ICD-10-CM

## 2020-08-03 DIAGNOSIS — Z3480 Encounter for supervision of other normal pregnancy, unspecified trimester: Secondary | ICD-10-CM

## 2020-08-03 DIAGNOSIS — O26899 Other specified pregnancy related conditions, unspecified trimester: Secondary | ICD-10-CM

## 2020-08-03 NOTE — Progress Notes (Signed)
   PRENATAL VISIT NOTE  Subjective:  Kristi [name] is a 35 y.o. G2P1001 at [redacted]w[redacted]d being seen today for ongoing prenatal care.  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  Patient reports occasional contractions.  Contractions: Not present. Vag. Bleeding: None.  Movement: Present. Denies leaking of fluid.   The following portions of the patient's history were reviewed and updated as appropriate: allergies, current medications, past family history, past medical history, past social history, past surgical history and problem list.   Objective:   Vitals:   08/03/20 0832  BP: 108/67  Pulse: 95  Weight: 172 lb 9.6 oz (78.3 kg)    Fetal Status: Fetal Heart Rate (bpm): 155   Movement: Present     General:  Alert, oriented and cooperative. Patient is in no acute distress.  Skin: Skin is warm and dry. No rash noted.   Cardiovascular: Normal heart rate noted  Respiratory: Normal respiratory effort, no problems with respiration noted  Abdomen: Soft, gravid, appropriate for gestational age.  Pain/Pressure: Absent     Pelvic: Cervical exam deferred        Extremities: Normal range of motion.     Mental Status: Normal mood and affect. Normal behavior. Normal judgment and thought content.   Assessment and Plan:  Pregnancy: G2P1001 at [redacted]w[redacted]d 1. Supervision of other normal pregnancy, antepartum --Anticipatory guidance about next visits/weeks of pregnancy given. --Next visit in 2 weeks --Discussed elective IOL at 39 weeks with pt today at her request, pt undecided.   2. Rh negative state in antepartum period, third trimester -Rhophylac given at last visit  3. Abdominal pain affecting pregnancy --Pain  and spotting noted at last visit 07/19/20, following busy weekend with class reunion and dancing.   --Pt reports improved symptoms, with only occasional mild cramping, resolves with rest.  4. Anemia affecting pregnancy in third trimester --Taking PNV and will add every other day iron supplement   Preterm labor symptoms and general obstetric precautions including but not limited to vaginal bleeding, contractions, leaking of fluid and fetal movement were reviewed in detail with the patient. Please refer to After Visit Summary for other counseling recommendations.   Return in about 2 weeks (around 08/17/2020).  Future Appointments  Date Time Provider Department Center  08/16/2020  8:55 AM Leftwich-Kirby, Wilmer Floor, CNM [facility] None    Sharen Counter, CNM

## 2020-08-03 NOTE — Patient Instructions (Signed)

## 2020-08-16 ENCOUNTER — Ambulatory Visit (INDEPENDENT_AMBULATORY_CARE_PROVIDER_SITE_OTHER): Payer: Self-pay | Admitting: Advanced Practice Midwife

## 2020-08-16 ENCOUNTER — Other Ambulatory Visit: Payer: Self-pay

## 2020-08-16 VITALS — BP 111/61 | HR 84 | Wt 175.0 lb

## 2020-08-16 DIAGNOSIS — Z9189 Other specified personal risk factors, not elsewhere classified: Secondary | ICD-10-CM

## 2020-08-16 DIAGNOSIS — F32A Depression, unspecified: Secondary | ICD-10-CM

## 2020-08-16 DIAGNOSIS — Z3480 Encounter for supervision of other normal pregnancy, unspecified trimester: Secondary | ICD-10-CM

## 2020-08-16 DIAGNOSIS — O361930 Maternal care for other isoimmunization, third trimester, not applicable or unspecified: Secondary | ICD-10-CM

## 2020-08-16 DIAGNOSIS — O99343 Other mental disorders complicating pregnancy, third trimester: Secondary | ICD-10-CM

## 2020-08-16 DIAGNOSIS — O99013 Anemia complicating pregnancy, third trimester: Secondary | ICD-10-CM

## 2020-08-16 NOTE — Progress Notes (Signed)
   PRENATAL VISIT NOTE  Subjective:  Kristi [name] is a 35 y.o. G2P1001 at [redacted]w[redacted]d being seen today for ongoing prenatal care.  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  Patient reports no complaints.  Contractions: Not present. Vag. Bleeding: None, Other.  Movement: Present. Denies leaking of fluid.   The following portions of the patient's history were reviewed and updated as appropriate: allergies, current medications, past family history, past medical history, past social history, past surgical history and problem list.   Objective:   Vitals:   08/16/20 0913  BP: 111/61  Pulse: 84  Weight: 175 lb (79.4 kg)    Fetal Status: Fetal Heart Rate (bpm): 142 Fundal Height: 33 cm Movement: Present     General:  Alert, oriented and cooperative. Patient is in no acute distress.  Skin: Skin is warm and dry. No rash noted.   Cardiovascular: Normal heart rate noted  Respiratory: Normal respiratory effort, no problems with respiration noted  Abdomen: Soft, gravid, appropriate for gestational age.  Pain/Pressure: Absent     Pelvic: Cervical exam deferred        Extremities: Normal range of motion.  Edema: None  Mental Status: Normal mood and affect. Normal behavior. Normal judgment and thought content.   Assessment and Plan:  Pregnancy: G2P1001 at [redacted]w[redacted]d 1. Supervision of other normal pregnancy, antepartum --Anticipatory guidance about next visits/weeks of pregnancy given. --Next appt in 2 weeks  2. Anemia affecting pregnancy in third trimester --Taking PNV and ferrous sulfate  3. At risk for depression   4. Anti-M isoimmunization affecting pregnancy in third trimester   5. Depression  affecting pregnancy in third trimester, antepartum --Pt reports desire for counseling/support for recent depression --She denies risks of harming herself or others - Ambulatory referral to Integrated Behavioral Health   Preterm labor symptoms and general obstetric precautions including but not limited to vaginal bleeding, contractions, leaking of fluid and fetal movement were reviewed in detail with the patient. Please refer to After Visit Summary for other counseling recommendations.   No follow-ups on file.  Future Appointments  Date Time Provider Department Center  08/30/2020  8:35 AM Constant, Gigi Gin, MD [facility] None  08/30/2020  9:00 AM Gwyndolyn Saxon, LCSW [facility] None     Sharen Counter, CNM

## 2020-08-30 ENCOUNTER — Other Ambulatory Visit: Payer: Self-pay

## 2020-08-30 ENCOUNTER — Ambulatory Visit (INDEPENDENT_AMBULATORY_CARE_PROVIDER_SITE_OTHER): Payer: Managed Care, Other (non HMO) | Admitting: Obstetrics and Gynecology

## 2020-08-30 ENCOUNTER — Encounter: Payer: Self-pay | Admitting: Obstetrics and Gynecology

## 2020-08-30 ENCOUNTER — Ambulatory Visit: Payer: Self-pay | Admitting: Licensed Clinical Social Worker

## 2020-08-30 VITALS — BP 110/62 | HR 86 | Wt 177.0 lb

## 2020-08-30 DIAGNOSIS — Z23 Encounter for immunization: Secondary | ICD-10-CM

## 2020-08-30 DIAGNOSIS — Z3480 Encounter for supervision of other normal pregnancy, unspecified trimester: Secondary | ICD-10-CM | POA: Diagnosis not present

## 2020-08-30 NOTE — Progress Notes (Signed)
   PRENATAL VISIT NOTE  Subjective:  Kristi [name] is a 35 y.o. G2P1001 at [redacted]w[redacted]d being seen today for ongoing prenatal care.  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  Patient reports no complaints.  Contractions: Not present. Vag. Bleeding: None.  Movement: Present. Denies leaking of fluid.   The following portions of the patient's history were reviewed and updated as appropriate: allergies, current medications, past family history, past medical history, past social history, past surgical history and problem list.   Objective:   Vitals:   08/30/20 0858  BP: 110/62  Pulse: 86  Weight: 177 lb (80.3 kg)    Fetal Status: Fetal Heart Rate (bpm): 141 Fundal Height: 35 cm Movement: Present     General:  Alert, oriented and cooperative. Patient is in no acute distress.  Skin: Skin is warm and dry. No rash noted.   Cardiovascular: Normal heart rate noted  Respiratory: Normal respiratory effort, no problems with respiration noted  Abdomen: Soft, gravid, appropriate for gestational age.  Pain/Pressure: Present     Pelvic: Cervical exam deferred        Extremities: Normal range of motion.  Edema: Trace  Mental Status: Normal mood and affect. Normal behavior. Normal judgment and thought content.   Assessment and Plan:  Pregnancy: G2P1001 at [redacted]w[redacted]d 1. Supervision of other normal pregnancy, antepartum Patient is doing well without complaints TDap today Patient reports headache today because she did not sleep well- supportive care measures reviewed Cultures next visit Patient desires to reschedule integrated behavioral health appointment  Preterm labor symptoms and general obstetric precautions  including but not limited to vaginal bleeding, contractions, leaking of fluid and fetal movement were reviewed in detail with the patient. Please refer to After Visit Summary for other counseling recommendations.   Return in about 2 weeks (around 09/13/2020) for in person, ROB, Low risk.  No future appointments.  Catalina Antigua, MD

## 2020-08-30 NOTE — Addendum Note (Signed)
Addended by: Maretta Bees on: 08/30/2020 09:30 AM   Modules accepted: Orders

## 2020-08-30 NOTE — Progress Notes (Signed)
ROB, c/o headache 4/10.

## 2020-09-13 ENCOUNTER — Other Ambulatory Visit: Payer: Self-pay

## 2020-09-13 ENCOUNTER — Ambulatory Visit (INDEPENDENT_AMBULATORY_CARE_PROVIDER_SITE_OTHER): Payer: Managed Care, Other (non HMO) | Admitting: Obstetrics

## 2020-09-13 ENCOUNTER — Ambulatory Visit: Payer: Managed Care, Other (non HMO) | Admitting: Licensed Clinical Social Worker

## 2020-09-13 ENCOUNTER — Encounter: Payer: Self-pay | Admitting: Obstetrics

## 2020-09-13 ENCOUNTER — Other Ambulatory Visit (HOSPITAL_COMMUNITY)
Admission: RE | Admit: 2020-09-13 | Discharge: 2020-09-13 | Disposition: A | Discharge status: Home / Self Care | Payer: Managed Care, Other (non HMO) | Source: Ambulatory Visit | Attending: Obstetrics | Admitting: Obstetrics

## 2020-09-13 VITALS — BP 115/71 | HR 89 | Wt 185.5 lb

## 2020-09-13 DIAGNOSIS — Z3480 Encounter for supervision of other normal pregnancy, unspecified trimester: Secondary | ICD-10-CM | POA: Diagnosis present

## 2020-09-13 DIAGNOSIS — Z6791 Unspecified blood type, Rh negative: Secondary | ICD-10-CM

## 2020-09-13 DIAGNOSIS — K5901 Slow transit constipation: Secondary | ICD-10-CM

## 2020-09-13 DIAGNOSIS — O361930 Maternal care for other isoimmunization, third trimester, not applicable or unspecified: Secondary | ICD-10-CM

## 2020-09-13 DIAGNOSIS — Z9189 Other specified personal risk factors, not elsewhere classified: Secondary | ICD-10-CM

## 2020-09-13 DIAGNOSIS — O99013 Anemia complicating pregnancy, third trimester: Secondary | ICD-10-CM

## 2020-09-13 DIAGNOSIS — O26893 Other specified pregnancy related conditions, third trimester: Secondary | ICD-10-CM

## 2020-09-13 MED ORDER — DOCUSATE SODIUM 100 MG PO CAPS
100.0000 mg | ORAL_CAPSULE | Freq: Two times a day (BID) | ORAL | 11 refills | Status: DC
Start: 2020-09-13 — End: 2020-10-05

## 2020-09-13 NOTE — Progress Notes (Signed)
Subjective:  Kristi [name] is a 35 y.o. G2P1001 at [redacted]w[redacted]d being seen today for ongoing prenatal care.  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  Patient reports backache.  Contractions: Not present. Vag. Bleeding: None.  Movement: Present. Denies leaking of fluid.   The following portions of the patient's history were reviewed and updated as appropriate: allergies, current medications, past family history, past medical history, past social history, past surgical history and problem list. Problem list updated.  Objective:   Vitals:   09/13/20 0852  BP: 115/71  Pulse: 89  Weight: 185 lb 8 oz (84.1 kg)    Fetal Status:     Movement: Present     General:  Alert, oriented and cooperative. Patient is in no acute distress.  Skin: Skin is warm and dry. No rash noted.   Cardiovascular: Normal heart rate noted  Respiratory: Normal respiratory effort, no problems with respiration noted  Abdomen: Soft, gravid, appropriate for gestational age. Pain/Pressure: Present     Pelvic:  Cervical exam performed      Cvx:  1 cm / 50 %/ / -3 / Vtx  Extremities: Normal range of motion.     Mental Status: Normal mood and affect. Normal behavior. Normal judgment and thought content.   Urinalysis:      Assessment and Plan:  Pregnancy: G2P1001 at [redacted]w[redacted]d  1. Supervision of other normal pregnancy, antepartum Rx: - Strep Gp B NAA - GC/Chlamydia probe amp ([place])not at [facility]  2. Anemia affecting pregnancy in third trimester - taking iron  3. At risk for depression - Child psychotherapist involved with ongoing counseling  4. Rh negative state in antepartum period, third trimester - Rhogam postpartum  5. Anti-M  isoimmunization affecting pregnancy in third trimester - followed by MFM  6. Constipation - colace Rx bid - increase fluids and fiber   Preterm labor symptoms and general obstetric precautions including but not limited to vaginal bleeding, contractions, leaking of fluid and fetal movement were reviewed in detail with the patient. Please refer to After Visit Summary for other counseling recommendations.   Return in about 1 week (around 09/20/2020) for ROB.   [name], MD  09/13/20

## 2020-09-14 LAB — GC/CHLAMYDIA PROBE AMP (~~LOC~~) NOT AT ARMC
Chlamydia: NEGATIVE
Comment: NEGATIVE
Comment: NORMAL
Neisseria Gonorrhea: NEGATIVE

## 2020-09-15 LAB — STREP GP B NAA: Strep Gp B NAA: NEGATIVE

## 2020-09-20 ENCOUNTER — Ambulatory Visit (INDEPENDENT_AMBULATORY_CARE_PROVIDER_SITE_OTHER): Payer: Managed Care, Other (non HMO) | Admitting: Advanced Practice Midwife

## 2020-09-20 ENCOUNTER — Other Ambulatory Visit: Payer: Self-pay

## 2020-09-20 VITALS — BP 103/69 | HR 101 | Wt 189.8 lb

## 2020-09-20 DIAGNOSIS — Z3A37 37 weeks gestation of pregnancy: Secondary | ICD-10-CM

## 2020-09-20 DIAGNOSIS — F32A Depression, unspecified: Secondary | ICD-10-CM

## 2020-09-20 DIAGNOSIS — O99343 Other mental disorders complicating pregnancy, third trimester: Secondary | ICD-10-CM

## 2020-09-20 DIAGNOSIS — Z3480 Encounter for supervision of other normal pregnancy, unspecified trimester: Secondary | ICD-10-CM

## 2020-09-20 DIAGNOSIS — O99013 Anemia complicating pregnancy, third trimester: Secondary | ICD-10-CM

## 2020-09-20 NOTE — Progress Notes (Signed)
   PRENATAL VISIT NOTE  Subjective:  Kristi [name] is a 35 y.o. G2P1001 at [redacted]w[redacted]d being seen today for ongoing prenatal care.  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  Patient reports occasional contractions.  Contractions: Not present. Vag. Bleeding: None.  Movement: Present. Denies leaking of fluid.   The following portions of the patient's history were reviewed and updated as appropriate: allergies, current medications, past family history, past medical history, past social history, past surgical history and problem list.   Objective:   Vitals:   09/20/20 1548  BP: 103/69  Pulse: (!) 101  Weight: 189 lb 12.8 oz (86.1 kg)    Fetal Status: Fetal Heart Rate (bpm): 143 Fundal Height: 37 cm Movement: Present  Presentation: Vertex  General:  Alert, oriented and cooperative. Patient is in no acute distress.  Skin: Skin is warm and dry. No rash noted.   Cardiovascular: Normal heart rate noted  Respiratory: Normal respiratory effort, no problems with respiration noted  Abdomen: Soft, gravid, appropriate for gestational age.  Pain/Pressure: Present     Pelvic: Cervical exam performed in the presence of a chaperone Dilation: 1 Effacement (%): 50 Station: Ballotable  Extremities: Normal range of motion.  Edema: Trace  Mental Status: Normal mood and affect. Normal behavior. Normal judgment and thought content.   Assessment and Plan:  Pregnancy: G2P1001 at [redacted]w[redacted]d 1. Supervision of other normal pregnancy, antepartum --Anticipatory guidance about next visits/weeks of pregnancy given. --Reviewed labor readiness with patient including the [facility] [employer], evening primrose oil, and raspberry leaf tea.    --Next visit in 1 week  2. Anemia affecting pregnancy in third trimester --Taking oral iron, no s/sx  3. Depression affecting pregnancy in third trimester, antepartum --Stable, no medications  4. [redacted] weeks gestation of pregnancy  Term labor symptoms and general obstetric precautions including but not limited to vaginal bleeding, contractions, leaking of fluid and fetal movement were reviewed in detail with the patient. Please refer to After Visit Summary for other counseling recommendations.   Return in about 1 week (around 09/27/2020).  Future Appointments  Date Time Provider Department Center  09/30/2020  8:35 AM Nugent, Odie Sera, NP [facility] None    Sharen Counter, CNM

## 2020-09-20 NOTE — Patient Instructions (Signed)
Things to Try After 37 weeks to Encourage Labor/Get Ready for Labor:    Try the [employer] Circuit at www.milescircuit.com daily to improve baby's position and encourage the onset of labor.  Walk a little and rest a little every day.  Change positions often.  Cervical Ripening: May try one or both Red Raspberry Leaf capsules or tea:  two 300mg or 400mg tablets with each meal, 2-3 times a day, or 1-3 cups of tea daily  Potential Side Effects Of Raspberry Leaf:  Most women do not experience any side effects from drinking raspberry leaf tea. However, nausea and loose stools are possible   Evening Primrose Oil capsules: take 1 capsule by mouth and place one capsule in the vagina every night.    Some of the potential side effects:  Upset stomach  Loose stools or diarrhea  Headaches  Nausea  Sex can also help the cervix ripen and encourage labor onset.    Labor Precautions Reasons to come to MAU at [facility]:  1.  Contractions are  5 minutes apart or less, each last 1 minute, these have been going on for 1-2 hours, and you cannot walk or talk during them 2.  You have a large gush of fluid, or a trickle of fluid that will not stop and you have to wear a pad 3.  You have bleeding that is bright red, heavier than spotting--like menstrual bleeding (spotting can be normal in early labor or after a check of your cervix) 4.  You do not feel the baby moving like he/she normally does  

## 2020-09-29 ENCOUNTER — Other Ambulatory Visit: Payer: Self-pay

## 2020-09-29 ENCOUNTER — Ambulatory Visit (INDEPENDENT_AMBULATORY_CARE_PROVIDER_SITE_OTHER): Payer: Managed Care, Other (non HMO) | Admitting: Women's Health

## 2020-09-29 VITALS — BP 123/68 | HR 100 | Wt 192.0 lb

## 2020-09-29 DIAGNOSIS — O361930 Maternal care for other isoimmunization, third trimester, not applicable or unspecified: Secondary | ICD-10-CM

## 2020-09-29 DIAGNOSIS — O26843 Uterine size-date discrepancy, third trimester: Secondary | ICD-10-CM

## 2020-09-29 DIAGNOSIS — Z3480 Encounter for supervision of other normal pregnancy, unspecified trimester: Secondary | ICD-10-CM

## 2020-09-29 DIAGNOSIS — Z3A39 39 weeks gestation of pregnancy: Secondary | ICD-10-CM

## 2020-09-29 NOTE — Patient Instructions (Addendum)
Maternity Assessment Unit (MAU)  The Maternity Assessment Unit (MAU) is located at the [facility] at [facility]. The address is: [address], [place], [place], [place] [address]. Please see map below for additional directions.    The Maternity Assessment Unit is designed to help you during your pregnancy, and for up to 6 weeks after delivery, with any pregnancy- or postpartum-related emergencies, if you think you are in labor, or if your water has broken. For example, if you experience nausea and vomiting, vaginal bleeding, severe abdominal or pelvic pain, elevated blood pressure or other problems related to your pregnancy or postpartum time, please come to the Maternity Assessment Unit for assistance.       Things to Try After 37 weeks to Encourage Labor/Get Ready for Labor:    Try the [employer] at https://glass.com/.com daily to improve baby's position and encourage the onset of labor.  Walk a little and rest a little every day.  Change positions often.  Cervical Ripening: May try one or both Red Raspberry Leaf capsules or tea:  two 300mg  or 400mg  tablets with each meal, 2-3 times a day, or 1-3 cups of tea daily  Potential Side Effects Of Raspberry Leaf:  Most women do not experience any side effects from drinking raspberry leaf tea. However, nausea and loose stools are possible   Evening Primrose Oil capsules: take 1 capsule by mouth and place one capsule in the vagina every night.    Some of the potential side effects:  Upset stomach  Loose stools or diarrhea  Headaches  Nausea  Sex can also help the cervix ripen and encourage labor onset.        Signs and Symptoms of Labor Labor is the body's natural process of moving the baby and the placenta out of the uterus. The process of labor usually starts when the baby is full-term, between 27 and 40 weeks of pregnancy. Signs and symptoms that you are close to going into labor As your  body prepares for labor and the birth of your baby, you may notice the following symptoms in the weeks and days before true labor starts: Passing a small amount of thick, bloody mucus from your vagina. This is called normal bloody show or losing your mucus plug. This may happen more than a week before labor begins, or right before labor begins, as the opening of the cervix starts to widen (dilate). For some women, the entire mucus plug passes at once. For others, pieces of the mucus plug may gradually pass over several days. Your baby moving (dropping) lower in your pelvis to get into position for birth (lightening). When this happens, you may feel more pressure on your bladder and pelvic bone and less pressure on your ribs. This may make it easier to breathe. It may also cause you to need to urinate more often and have problems with bowel movements. Having "practice contractions," also called Braxton Hicks contractions or false labor. These occur at irregular (unevenly spaced) intervals that are more than 10 minutes apart. False labor contractions are common after exercise or sexual activity. They will stop if you change position, rest, or drink fluids. These contractions are usually mild and do not get stronger over time. They may feel like: A backache or back pain. Mild cramps, similar to menstrual cramps. Tightening or pressure in your abdomen. Other early symptoms include: Nausea or loss of appetite. Diarrhea. Having a sudden burst of energy, or feeling very tired.  Mood changes. Having trouble sleeping. Signs and symptoms that labor has begun Signs that you are in labor may include: Having contractions that come at regular (evenly spaced) intervals and increase in intensity. This may feel like more intense tightening or pressure in your abdomen that moves to your back. Contractions may also feel like rhythmic pain in your upper thighs or back that comes and goes at regular intervals. For  first-time mothers, this change in intensity of contractions often occurs at a more gradual pace. Women who have given birth before may notice a more rapid progression of contraction changes. Feeling pressure in the vaginal area. Your water breaking (rupture of membranes). This is when the sac of fluid that surrounds your baby breaks. Fluid leaking from your vagina may be clear or blood-tinged. Labor usually starts within 24 hours of your water breaking, but it may take longer to begin. Some women may feel a sudden gush of fluid. Others notice that their underwear repeatedly becomes damp. Follow these instructions at home:  When labor starts, or if your water breaks, call your health care provider or nurse care line. Based on your situation, they will determine when you should go in for an exam. During early labor, you may be able to rest and manage symptoms at home. Some strategies to try at home include: Breathing and relaxation techniques. Taking a warm bath or shower. Listening to music. Using a heating pad on the lower back for pain. If you are directed to use heat: Place a towel between your skin and the heat source. Leave the heat on for 20-30 minutes. Remove the heat if your skin turns bright red. This is especially important if you are unable to feel pain, heat, or cold. You may have a greater risk of getting burned. Contact a health care provider if: Your labor has started. Your water breaks. Get help right away if: You have painful, regular contractions that are 5 minutes apart or less. Labor starts before you are [redacted] weeks along in your pregnancy. You have a fever. You have bright red blood coming from your vagina. You do not feel your baby moving. You have a severe headache with or without vision problems. You have severe nausea, vomiting, or diarrhea. You have chest pain or shortness of breath. These symptoms may represent a serious problem that is an emergency. Do not wait  to see if the symptoms will go away. Get medical help right away. Call your local emergency services (911 in the [place] Do not drive yourself to the hospital. Summary Labor is your body's natural process of moving your baby and the placenta out of your uterus. The process of labor usually starts when your baby is full-term, between 34 and 40 weeks of pregnancy. When labor starts, or if your water breaks, call your health care provider or nurse care line. Based on your situation, they will determine when you should go in for an exam. This information is not intended to replace advice given to you by your health care provider. Make sure you discuss any questions you have with your health care provider. Document Revised: 11/08/2019 Document Reviewed: 11/08/2019 Elsevier Patient Education  2022 [employer].       New Induction of Labor Process for [employer] [facility]  In Fall 2020 [facility] changed it's process for scheduling inductions of labor to create more induction slots and to make sure patients get COVID-19 testing in advance. After  you have been tested you need to quarantine so that you do not get infected after your test. You should not go anywhere after your test except necessary medical appointments.  You have been scheduled for induction of labor on 10/13/2020. Although you may have a specific time listed on your After Visit Summary or MyChart, we cannot predict when your room will be available. Please disregard this time. A Labor and Delivery staff member will call you on the day that you are scheduled when your room is available. You will need to arrive within one hour of being called. If you do not arrive within this time frame, the next person on the list will be called in and you will move down the list. You may eat a light meal before coming to the hospital. If you go into labor, think your water has broken, experience bright  red bleeding or don't feel your baby moving as much as usual before your induction, please call your Ob/Gyn's office or come to Entrance C, Maternity Assessment Unit for evaluation.  Thank you,  [facility] [employer]

## 2020-09-29 NOTE — Progress Notes (Signed)
Subjective:  Kristi [name] is a 35 y.o. G2P1001 at [redacted]w[redacted]d being seen today for ongoing prenatal care.  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  Patient reports no complaints.  Contractions: Not present. Vag. Bleeding: None.  Movement: Present. Denies leaking of fluid.   The following portions of the patient's history were reviewed and updated as appropriate: allergies, current medications, past family history, past medical history, past social history, past surgical history and problem list. Problem list updated.  Objective:   Vitals:   09/29/20 1411  BP: 123/68  Pulse: 100  Weight: 192 lb (87.1 kg)    Fetal Status: Fetal Heart Rate (bpm): 140 Fundal Height: 46 cm Movement: Present  Presentation: Undeterminable  General:  Alert, oriented and cooperative. Patient is in no acute distress.  Skin: Skin is warm and dry. No rash noted.   Cardiovascular: Normal heart rate noted  Respiratory: Normal respiratory effort, no problems with respiration noted  Abdomen: Soft, gravid, appropriate for gestational age. Pain/Pressure: Present     Pelvic: Vag. Bleeding: None     Cervical exam deferred Dilation: 1 Effacement (%): 50    Extremities: Normal range of motion.  Edema: Trace  Mental Status: Normal mood and affect. Normal behavior. Normal judgment and thought content.   Urinalysis:      Assessment and Plan:  Pregnancy: G2P1001 at [redacted]w[redacted]d  1. Supervision of other normal pregnancy, antepartum -IOL scheduled 41 weeks 10/13/2020 ---Reviewed labor readiness with patient including the [facility] Circuit, evening primrose oil, and raspberry leaf tea.  -S>D,  growth [place] ordered for this week  2. 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 [redacted] weeks gestation of pregnancy  Term labor symptoms and general obstetric precautions including but not limited to vaginal bleeding, contractions, leaking of fluid and fetal movement were reviewed in detail with the patient. I discussed the assessment and treatment plan with the patient. The patient was provided an opportunity to ask questions and all were answered. The patient agreed with the plan and demonstrated an understanding of the instructions. The patient was advised to call back or seek an in-person office evaluation/go to MAU at [facility] for any urgent or concerning symptoms. Please refer to After Visit Summary for other counseling recommendations.  Return in about 1 week (around 10/06/2020) for in-peson LOB/NST/APP OK, needs growth [place] this week.   Kristi [name], Odie Sera, NP

## 2020-09-29 NOTE — Progress Notes (Signed)
+   fetal movement.  

## 2020-09-30 ENCOUNTER — Encounter: Payer: Managed Care, Other (non HMO) | Admitting: Women's Health

## 2020-09-30 ENCOUNTER — Encounter: Payer: Managed Care, Other (non HMO) | Admitting: Obstetrics and Gynecology

## 2020-10-03 ENCOUNTER — Inpatient Hospital Stay (HOSPITAL_COMMUNITY)
Admission: AD | Admit: 2020-10-03 | Discharge: 2020-10-05 | DRG: 807 | Disposition: A | Discharge status: Home / Self Care | Payer: Managed Care, Other (non HMO) | Attending: Obstetrics and Gynecology | Admitting: Obstetrics and Gynecology

## 2020-10-03 ENCOUNTER — Other Ambulatory Visit: Payer: Self-pay

## 2020-10-03 ENCOUNTER — Inpatient Hospital Stay (HOSPITAL_BASED_OUTPATIENT_CLINIC_OR_DEPARTMENT_OTHER): Payer: Managed Care, Other (non HMO)

## 2020-10-03 ENCOUNTER — Encounter (HOSPITAL_COMMUNITY): Payer: Self-pay | Admitting: Obstetrics and Gynecology

## 2020-10-03 DIAGNOSIS — O368131 Decreased fetal movements, third trimester, fetus 1: Secondary | ICD-10-CM | POA: Diagnosis not present

## 2020-10-03 DIAGNOSIS — O368139 Decreased fetal movements, third trimester, other fetus: Secondary | ICD-10-CM

## 2020-10-03 DIAGNOSIS — O361930 Maternal care for other isoimmunization, third trimester, not applicable or unspecified: Secondary | ICD-10-CM | POA: Diagnosis present

## 2020-10-03 DIAGNOSIS — O368130 Decreased fetal movements, third trimester, not applicable or unspecified: Principal | ICD-10-CM | POA: Diagnosis present

## 2020-10-03 DIAGNOSIS — O320XX0 Maternal care for unstable lie, not applicable or unspecified: Secondary | ICD-10-CM

## 2020-10-03 DIAGNOSIS — Z6791 Unspecified blood type, Rh negative: Secondary | ICD-10-CM

## 2020-10-03 DIAGNOSIS — O26893 Other specified pregnancy related conditions, third trimester: Secondary | ICD-10-CM | POA: Diagnosis present

## 2020-10-03 DIAGNOSIS — Z20822 Contact with and (suspected) exposure to covid-19: Secondary | ICD-10-CM | POA: Diagnosis present

## 2020-10-03 DIAGNOSIS — Z3A39 39 weeks gestation of pregnancy: Secondary | ICD-10-CM

## 2020-10-03 DIAGNOSIS — O322XX0 Maternal care for transverse and oblique lie, not applicable or unspecified: Secondary | ICD-10-CM | POA: Diagnosis present

## 2020-10-03 DIAGNOSIS — Z3490 Encounter for supervision of normal pregnancy, unspecified, unspecified trimester: Secondary | ICD-10-CM | POA: Diagnosis present

## 2020-10-03 DIAGNOSIS — O409XX0 Polyhydramnios, unspecified trimester, not applicable or unspecified: Secondary | ICD-10-CM | POA: Diagnosis present

## 2020-10-03 DIAGNOSIS — O26899 Other specified pregnancy related conditions, unspecified trimester: Secondary | ICD-10-CM

## 2020-10-03 DIAGNOSIS — O403XX0 Polyhydramnios, third trimester, not applicable or unspecified: Secondary | ICD-10-CM

## 2020-10-03 DIAGNOSIS — O403XX1 Polyhydramnios, third trimester, fetus 1: Secondary | ICD-10-CM | POA: Diagnosis not present

## 2020-10-03 DIAGNOSIS — O288 Other abnormal findings on antenatal screening of mother: Secondary | ICD-10-CM

## 2020-10-03 LAB — CBC
HCT: 33.8 % — ABNORMAL LOW (ref 36.0–46.0)
Hemoglobin: 11.1 g/dL — ABNORMAL LOW (ref 12.0–15.0)
MCH: 31.7 pg (ref 26.0–34.0)
MCHC: 32.8 g/dL (ref 30.0–36.0)
MCV: 96.6 fL (ref 80.0–100.0)
Platelets: 139 10*3/uL — ABNORMAL LOW (ref 150–400)
RBC: 3.5 MIL/uL — ABNORMAL LOW (ref 3.87–5.11)
RDW: 14.6 % (ref 11.5–15.5)
WBC: 6.1 10*3/uL (ref 4.0–10.5)
nRBC: 0.3 % — ABNORMAL HIGH (ref 0.0–0.2)

## 2020-10-03 LAB — TYPE AND SCREEN
ABO/RH(D): O NEG
Antibody Screen: POSITIVE

## 2020-10-03 MED ORDER — LACTATED RINGERS IV SOLN: INTRAVENOUS | Status: DC

## 2020-10-03 MED ORDER — LACTATED RINGERS IV SOLN
500.0000 mL | INTRAVENOUS | Status: DC | PRN
  Administered 2020-10-03: 500 mL via INTRAVENOUS

## 2020-10-03 NOTE — MAU Provider Note (Signed)
History     CSN: [id number]  Arrival date and time: 10/03/20 1724   Event Date/Time   First Provider Initiated Contact with Patient 10/03/20 1818      Chief Complaint  Patient presents with   Decreased Fetal Movement   Vaginal Bleeding   Kristi [name] is a 35 y.o. year old G59P1001 female at [redacted]w[redacted]d weeks gestation who presents to MAU reporting seeing some bright, red blood in the shower while washing her vaginal area. She had to wipe 4 times to clear blood. No bleeding noted since then. She also reports DFM today. She was not feeling UC's upon arrival to MAU, but now she is feeling them. Pain with UCs is rated 2-3/10. Her pregnancy is complicated by S?D, polyhydramnios and HSV 1. She receives  her [facility] with [facility]; next appt is 10/07/20. She also has an U/S scheduled for 10/05/20.   OB History     Gravida  2   Para  1   Term  1   Preterm      AB      Living  1      SAB      IAB      Ectopic      Multiple  0   Live Births  1        Obstetric Comments  D&E on 07/20/17 (8 weeks PP) for RPOCs         Past Medical History:  Diagnosis Date   Pap smear abnormality of cervix     Past Surgical History:  Procedure Laterality Date   COLPOSCOPY  2013-2014   DILATION AND EVACUATION N/A 07/20/2017   Procedure: DILATATION AND EVACUATION;  Surgeon: Tereso Newcomer, MD;  Location: [facility];  Service: Gynecology;  Laterality: N/A;   SIGMOIDOSCOPY      Family History  Problem Relation Age of Onset   Hypertension Mother    Stroke Mother        tia   Hypertension Father    Cancer Maternal Grandfather    Cancer Paternal Grandmother        ovarian   Diabetes Paternal Grandmother     Social History   Tobacco Use   Smoking status: Never   Smokeless tobacco: Never  Vaping Use   Vaping Use: Never used  Substance Use Topics   Alcohol use: Not Currently   Drug use: No    Allergies:  Allergies  Allergen Reactions   Adhesive [Tape] Rash    Medications Prior  to Admission  Medication Sig Dispense Refill Last Dose   docusate sodium (COLACE) 100 MG capsule Take 1 capsule (100 mg total) by mouth 2 (two) times daily. (Patient not taking: No sig reported) 60 capsule 11    Elastic Bandages & Supports (COMFORT FIT MATERNITY SUPP MED) MISC Wear daily when ambulating (Patient not taking: No sig reported) 1 each 0    ferrous sulfate (FERROUSUL) 325 (65 FE) MG tablet Take 1 tablet (325 mg total) by mouth every other day. 30 tablet 2    Prenatal Vit-Fe Fumarate-FA (PRENATAL MULTIVITAMIN) TABS tablet Take 1 tablet by mouth daily at 12 noon.      prenatal vitamin w/FE, FA (PRENATAL 1 + 1) 27-1 MG TABS tablet Take 1 tablet by mouth daily before breakfast. (Patient not taking: Reported on 09/29/2020) 90 tablet 3    Probiotic Product (PROBIOTIC-10) CAPS Take by mouth.       Review of Systems  Constitutional: Negative.   HENT: Negative.  Eyes: Negative.   Respiratory: Negative.    Cardiovascular: Negative.   Gastrointestinal: Negative.   Endocrine: Negative.   Genitourinary:  Positive for vaginal bleeding.  Musculoskeletal: Negative.   Skin: Negative.   Allergic/Immunologic: Negative.   Neurological: Negative.   Hematological: Negative.   Psychiatric/Behavioral: Negative.    Physical Exam   Blood pressure 112/69, pulse 77, temperature (!) 97.5 F (36.4 C), temperature source Oral, resp. rate 17, last menstrual period 12/31/2019.  Physical Exam Vitals and nursing note reviewed.  Constitutional:      Appearance: Normal appearance.  Cardiovascular:     Rate and Rhythm: Normal rate.  Pulmonary:     Breath sounds: Normal breath sounds.  Musculoskeletal:        General: Normal range of motion.  Skin:    General: Skin is warm and dry.  Neurological:     Mental Status: She is alert and oriented to person, place, and time.  Psychiatric:        Mood and Affect: Mood normal.        Behavior: Behavior normal.        Thought Content: Thought content  normal.        Judgment: Judgment normal.   Cervical exam by RN: Dilation: 1.5 Effacement (%): Thick Cervical Position: Posterior Station: Ballotable Exam by:: Polos RN   REACTIVE NST - FHR: 125 bpm / minimal variability / accels present / decels absent / TOCO: regular every 1-3 mins >>   MAU Course  Procedures  Patient informed that the ultrasound is considered a limited OB ultrasound and is not intended to be a complete ultrasound exam.  Patient also informed that the ultrasound is not being completed with the intent of assessing for fetal or placental anomalies or any pelvic abnormalities.  Explained that the purpose of today's ultrasound is to assess for presentation.  Baby was found to be in a transverse position; head to maternal LT. Patient acknowledges the purpose of the exam and the limitations of the study.    MDM CEFM BPP  *Consult with Dr. Alysia Penna @ 2036 - notified of patient's complaints, assessments, lab & U/S results, tx plan admit for IOL - agrees with plan  updated on BS U/S results - continue with admission    Assessment and Plan  Polyhydramnios affecting pregnancy in third trimester 2. NST (non-stress test) nonreactive 3. Unstable Lie of Fetus 4. [redacted] weeks gestation of pregnancy  - Admit to L&D  - Routine admission orders L&D - Sharen Counter, CNM notified of admission>>H&P will be done upon admission  [name], CNM 10/03/2020, 6:19 PM

## 2020-10-03 NOTE — MAU Note (Signed)
G2P1 at 39.4 weeks, being followed with MD for size greater than dates/possible poly.  Reports seeing blood in shower at around 4:30pm today, bright red, nothing in underwear but needed several wipes to wipe it away.  Since then she has not fely baby move as much.  Denies at CTX or LOF.  Was 1cm in the office last week.

## 2020-10-03 NOTE — Progress Notes (Signed)
Kristi [name] is a 35 y.o. G2P1001 at [redacted]w[redacted]d by LMP admitted for induction of labor due to decreased fetal movement, mild polyhydramnios.  Subjective: Pt comfortable with irregular mild contractions.   Objective: BP (!) 109/54   Pulse 76   Temp 98.5 F (36.9 C) (Oral)   Resp 16   LMP 12/31/2019 (Exact Date)  No intake/output data recorded. No intake/output data recorded.  FHT:  FHR: 135 bpm, variability: moderate with periods of minimal,  accelerations:  Present,  decelerations:  Absent UC:   irregular, every 2-6 minutes SVE:   Dilation: 1 Effacement (%): Thick Station: Ballotable Exam by:: Carloyn Jaeger, CNM   Fetal position transverse with head to maternal left, confirmed with [place] and bedside US.  Pt reports that she was encouraged to change positions during her BPP earlier and the baby's position changed from transverse to vertex.   Labs: Lab Results  Component Value Date   WBC 6.1 10/03/2020   HGB 11.1 (L) 10/03/2020   HCT 33.8 (L) 10/03/2020   MCV 96.6 10/03/2020   PLT 139 (L) 10/03/2020    Assessment / Plan: G2P1001 at [redacted]w[redacted]d with decreased fetal movement and polyhydramnios   Fetal malposition with transverse lie, head to maternal left  Labor:  Given that pt repositioning moved fetal position from transverse to vertex during her ultrasound, will try maternal positioning again.  Pt left side lying with peanut ball and will reevaluate fetal position in 30 minutes.  Preeclampsia:   n/a Fetal Wellbeing:  Category I Pain Control:  Labor support without medications I/D:   GBS neg Anticipated MOD:   unsure given fetal malposition  Sharen Counter 10/03/2020, 10:49 PM

## 2020-10-03 NOTE — H&P (Signed)
OBSTETRIC ADMISSION HISTORY AND PHYSICAL  Alexyia Accardo is a 35 y.o. female G2P1001 with IUP at [redacted]w[redacted]d by LMP presenting for decreased fetal movement in the setting of mild polyhydramnios.    No LOF, no VB, no blurry vision, headaches or peripheral edema, and RUQ pain.  She plans on breast/formula feeding. She request POPs for birth control. She received her prenatal care at  [facility]    Dating: By LMP --->  Estimated Date of Delivery: 10/06/20  Sono:    @[redacted]w[redacted]d , CWD, normal anatomy, transverse lie, 1150g, 81% EFW AFI subjectively upper normal.  BPP on 10/04/20 at [redacted]w[redacted]d with 6/8 for breathing.  AFI 29.43, variable fetal lie during ultrasound   Prenatal History/Complications: Hx PPH with retained POCs  Past Medical History: Past Medical History:  Diagnosis Date   Pap smear abnormality of cervix     Past Surgical History: Past Surgical History:  Procedure Laterality Date   COLPOSCOPY  2013-2014   DILATION AND EVACUATION N/A 07/20/2017   Procedure: DILATATION AND EVACUATION;  Surgeon: 07/22/2017, MD;  Location: [facility];  Service: Gynecology;  Laterality: N/A;   SIGMOIDOSCOPY      Obstetrical History: OB History     Gravida  2   Para  1   Term  1   Preterm      AB      Living  1      SAB      IAB      Ectopic      Multiple  0   Live Births  1        Obstetric Comments  D&E on 07/20/17 (8 weeks PP) for RPOCs         Social History Social History   Socioeconomic History   Marital status: Single    Spouse name: Not on file   Number of children: Not on file   Years of education: Not on file   Highest education level: Not on file  Occupational History   Not on file  Tobacco Use   Smoking status: Never   Smokeless tobacco: Never  Vaping Use   Vaping Use: Never used  Substance and Sexual Activity   Alcohol use: Not Currently   Drug use: No   Sexual activity: Yes    Partners: Male    Birth control/protection: None    Comment: Pregnant    Other Topics Concern   Not on file  Social History Narrative   Not on file   Social Determinants of Health   Financial Resource Strain: Not on file  Food Insecurity: Not on file  Transportation Needs: Not on file  Physical Activity: Not on file  Stress: Not on file  Social Connections: Not on file    Family History: Family History  Problem Relation Age of Onset   Hypertension Mother    Stroke Mother        tia   Hypertension Father    Cancer Maternal Grandfather    Cancer Paternal Grandmother        ovarian   Diabetes Paternal Grandmother     Allergies: Allergies  Allergen Reactions   Adhesive [Tape] Rash    Medications Prior to Admission  Medication Sig Dispense Refill Last Dose   docusate sodium (COLACE) 100 MG capsule Take 1 capsule (100 mg total) by mouth 2 (two) times daily. 60 capsule 11 10/02/2020   ferrous sulfate (FERROUSUL) 325 (65 FE) MG tablet Take 1 tablet (325 mg total) by mouth  every other day. 30 tablet 2 10/02/2020   Prenatal Vit-Fe Fumarate-FA (PRENATAL MULTIVITAMIN) TABS tablet Take 1 tablet by mouth daily at 12 noon.   10/02/2020   Elastic Bandages & Supports (COMFORT FIT MATERNITY SUPP MED) MISC Wear daily when ambulating (Patient not taking: No sig reported) 1 each 0    prenatal vitamin w/FE, FA (PRENATAL 1 + 1) 27-1 MG TABS tablet Take 1 tablet by mouth daily before breakfast. (Patient not taking: Reported on 09/29/2020) 90 tablet 3    Probiotic Product (PROBIOTIC-10) CAPS Take by mouth.        Review of Systems   All systems reviewed and negative except as stated in HPI  Blood pressure (!) 122/95, pulse (!) 105, temperature 98.5 F (36.9 C), temperature source Oral, resp. rate 16, last menstrual period 12/31/2019. General appearance: alert, cooperative, and no distress Lungs: clear to auscultation bilaterally Heart: regular rate and rhythm Abdomen: soft, non-tender; bowel sounds normal Pelvic: See cervical exam below Extremities: Homans sign  is negative, no sign of DVT DTR's wnl Presentation:  variable, transverse/oblique on admission Fetal monitoringBaseline: 135 bpm, Variability: moderate with some periods of minimal, Accelerations: Reactive, and Decelerations: Absent Uterine activity, irregular, mild to palpation  Dilation: 3.5 Effacement (%): Thick Station: Ballotable Exam by:: Madalyn Rob, RN   Prenatal labs: ABO, Rh: --/--/O NEG (09/04 2108) Antibody: POS (09/04 2108) Rubella: 2.81 (02/21 1552) RPR: Non Reactive (06/20 1122)  HBsAg: Negative (02/21 1552)  HIV: Non Reactive (06/20 1122)  GBS: Negative/-- (08/15 0950)  2 hour GTT: wnl  Glucose, Fasting 65 - 91 mg/dL 71   Glucose, 1 hour 65 - 179 mg/dL 85   Glucose, 2 hour 65 - 152 mg/dL 83   Genetic screening  Panorama low risk female, normal AFP and carrier screen Anatomy US low lying placenta, resolved 07/05/20  Prenatal Transfer Tool  Maternal Diabetes: No Genetic Screening: Normal Maternal Ultrasounds/Referrals: Normal Fetal Ultrasounds or other Referrals:  None Maternal Substance Abuse:  No Significant Maternal Medications:  None Significant Maternal Lab Results: None and Group B Strep negative  Results for orders placed or performed during the hospital encounter of 10/03/20 (from the past 24 hour(s))  CBC   Collection Time: 10/03/20  8:59 PM  Result Value Ref Range   WBC 6.1 4.0 - 10.5 K/uL   RBC 3.50 (L) 3.87 - 5.11 MIL/uL   Hemoglobin 11.1 (L) 12.0 - 15.0 g/dL   HCT 16.1 (L) 09.6 - 04.5 %   MCV 96.6 80.0 - 100.0 fL   MCH 31.7 26.0 - 34.0 pg   MCHC 32.8 30.0 - 36.0 g/dL   RDW 40.9 81.1 - 91.4 %   Platelets 139 (L) 150 - 400 K/uL   nRBC 0.3 (H) 0.0 - 0.2 %  Type and screen [facility]   Collection Time: 10/03/20  9:08 PM  Result Value Ref Range   ABO/RH(D) O NEG    Antibody Screen POS    Sample Expiration 10/06/2020,2359    Antibody Identification      PASSIVELY ACQUIRED ANTI-D Performed at [facility]  [facility], 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 G2P1001 at [redacted]w[redacted]d here for IOL for decreased fetal movement and polyhydramnios  #Labor:Plan to encourage vertex fetal position by changing maternal position, and if this is not effective, consult Dr Alysia Penna for ECV. Will augment with  Cytotec when vertex position confirmed and abdominal binder is placed. #Pain: Labor support without medications, will consider IV medications/epidural PRN #FWB: Category I #ID:  GBS neg #MOF: breast/formula #MOC:POPs #Circ:  N/A female  Sharen Counter, CNM  10/04/2020, 1:58 AM

## 2020-10-04 ENCOUNTER — Encounter (HOSPITAL_COMMUNITY): Payer: Self-pay | Admitting: Obstetrics and Gynecology

## 2020-10-04 ENCOUNTER — Inpatient Hospital Stay (HOSPITAL_COMMUNITY): Payer: Managed Care, Other (non HMO) | Admitting: Anesthesiology

## 2020-10-04 DIAGNOSIS — O368131 Decreased fetal movements, third trimester, fetus 1: Secondary | ICD-10-CM

## 2020-10-04 DIAGNOSIS — Z3A39 39 weeks gestation of pregnancy: Secondary | ICD-10-CM

## 2020-10-04 DIAGNOSIS — Z3490 Encounter for supervision of normal pregnancy, unspecified, unspecified trimester: Secondary | ICD-10-CM | POA: Diagnosis present

## 2020-10-04 DIAGNOSIS — O403XX1 Polyhydramnios, third trimester, fetus 1: Secondary | ICD-10-CM

## 2020-10-04 LAB — RESP PANEL BY RT-PCR (FLU A&B, COVID) ARPGX2
Influenza A by PCR: NEGATIVE
Influenza B by PCR: NEGATIVE
SARS Coronavirus 2 by RT PCR: NEGATIVE

## 2020-10-04 LAB — CBC
HCT: 32.3 % — ABNORMAL LOW (ref 36.0–46.0)
Hemoglobin: 10.6 g/dL — ABNORMAL LOW (ref 12.0–15.0)
MCH: 31.7 pg (ref 26.0–34.0)
MCHC: 32.8 g/dL (ref 30.0–36.0)
MCV: 96.7 fL (ref 80.0–100.0)
Platelets: 131 10*3/uL — ABNORMAL LOW (ref 150–400)
RBC: 3.34 MIL/uL — ABNORMAL LOW (ref 3.87–5.11)
RDW: 14.5 % (ref 11.5–15.5)
WBC: 6.2 10*3/uL (ref 4.0–10.5)
nRBC: 0 % (ref 0.0–0.2)

## 2020-10-04 LAB — RPR: RPR Ser Ql: NONREACTIVE

## 2020-10-04 MED ORDER — ONDANSETRON HCL 4 MG/2ML IJ SOLN: 4.0000 mg | INTRAMUSCULAR | Status: DC | PRN

## 2020-10-04 MED ORDER — PRENATAL MULTIVITAMIN CH
1.0000 | ORAL_TABLET | Freq: Every day | ORAL | Status: DC
  Administered 2020-10-05: 1 via ORAL
  Filled 2020-10-04: qty 1

## 2020-10-04 MED ORDER — IBUPROFEN 600 MG PO TABS
600.0000 mg | ORAL_TABLET | Freq: Four times a day (QID) | ORAL | Status: DC
  Administered 2020-10-04 – 2020-10-05 (×3): 600 mg via ORAL
  Filled 2020-10-04 (×4): qty 1

## 2020-10-04 MED ORDER — TETANUS-DIPHTH-ACELL PERTUSSIS 5-2.5-18.5 LF-MCG/0.5 IM SUSY: 0.5000 mL | PREFILLED_SYRINGE | Freq: Once | INTRAMUSCULAR | Status: DC

## 2020-10-04 MED ORDER — MEASLES, MUMPS & RUBELLA VAC IJ SOLR: 0.5000 mL | Freq: Once | INTRAMUSCULAR | Status: DC

## 2020-10-04 MED ORDER — PHENYLEPHRINE 40 MCG/ML (10ML) SYRINGE FOR IV PUSH (FOR BLOOD PRESSURE SUPPORT): 80.0000 ug | PREFILLED_SYRINGE | INTRAVENOUS | Status: DC | PRN

## 2020-10-04 MED ORDER — LIDOCAINE HCL (PF) 1 % IJ SOLN: 30.0000 mL | INTRAMUSCULAR | Status: DC | PRN

## 2020-10-04 MED ORDER — DIPHENHYDRAMINE HCL 50 MG/ML IJ SOLN: 12.5000 mg | INTRAMUSCULAR | Status: DC | PRN

## 2020-10-04 MED ORDER — DIBUCAINE (PERIANAL) 1 % EX OINT: 1.0000 "application " | TOPICAL_OINTMENT | CUTANEOUS | Status: DC | PRN

## 2020-10-04 MED ORDER — OXYCODONE-ACETAMINOPHEN 5-325 MG PO TABS: 2.0000 | ORAL_TABLET | ORAL | Status: DC | PRN

## 2020-10-04 MED ORDER — ONDANSETRON HCL 4 MG PO TABS: 4.0000 mg | ORAL_TABLET | ORAL | Status: DC | PRN

## 2020-10-04 MED ORDER — EPHEDRINE 5 MG/ML INJ: 10.0000 mg | INTRAVENOUS | Status: DC | PRN

## 2020-10-04 MED ORDER — DIPHENHYDRAMINE HCL 25 MG PO CAPS: 25.0000 mg | ORAL_CAPSULE | Freq: Four times a day (QID) | ORAL | Status: DC | PRN

## 2020-10-04 MED ORDER — COCONUT OIL OIL
1.0000 "application " | TOPICAL_OIL | Status: DC | PRN
  Administered 2020-10-04: 1 via TOPICAL

## 2020-10-04 MED ORDER — SENNOSIDES-DOCUSATE SODIUM 8.6-50 MG PO TABS
2.0000 | ORAL_TABLET | ORAL | Status: DC
Start: 2020-10-04 — End: 2020-10-05

## 2020-10-04 MED ORDER — ZOLPIDEM TARTRATE 5 MG PO TABS: 5.0000 mg | ORAL_TABLET | Freq: Every evening | ORAL | Status: DC | PRN

## 2020-10-04 MED ORDER — WITCH HAZEL-GLYCERIN EX PADS: 1.0000 "application " | MEDICATED_PAD | CUTANEOUS | Status: DC | PRN

## 2020-10-04 MED ORDER — OXYTOCIN-SODIUM CHLORIDE 30-0.9 UT/500ML-% IV SOLN
2.5000 [IU]/h | INTRAVENOUS | Status: DC
  Administered 2020-10-04: 2.5 [IU]/h via INTRAVENOUS
  Filled 2020-10-04: qty 500

## 2020-10-04 MED ORDER — TERBUTALINE SULFATE 1 MG/ML IJ SOLN: 0.2500 mg | Freq: Once | INTRAMUSCULAR | Status: DC | PRN

## 2020-10-04 MED ORDER — FENTANYL-BUPIVACAINE-NACL 0.5-0.125-0.9 MG/250ML-% EP SOLN
12.0000 mL/h | EPIDURAL | Status: DC | PRN
  Filled 2020-10-04: qty 250

## 2020-10-04 MED ORDER — SIMETHICONE 80 MG PO CHEW: 80.0000 mg | CHEWABLE_TABLET | ORAL | Status: DC | PRN

## 2020-10-04 MED ORDER — LIDOCAINE HCL (PF) 1 % IJ SOLN
INTRAMUSCULAR | Status: DC | PRN
  Administered 2020-10-04: 5 mL via EPIDURAL

## 2020-10-04 MED ORDER — BENZOCAINE-MENTHOL 20-0.5 % EX AERO
1.0000 "application " | INHALATION_SPRAY | CUTANEOUS | Status: DC | PRN
  Administered 2020-10-05: 1 via TOPICAL
  Filled 2020-10-04: qty 56

## 2020-10-04 MED ORDER — ACETAMINOPHEN 325 MG PO TABS
650.0000 mg | ORAL_TABLET | ORAL | Status: DC | PRN
  Administered 2020-10-05: 650 mg via ORAL
  Filled 2020-10-04: qty 2

## 2020-10-04 MED ORDER — FENTANYL CITRATE (PF) 100 MCG/2ML IJ SOLN: 100.0000 ug | INTRAMUSCULAR | Status: DC | PRN

## 2020-10-04 MED ORDER — MISOPROSTOL 50MCG HALF TABLET
50.0000 ug | ORAL_TABLET | ORAL | Status: DC | PRN
  Administered 2020-10-04: 50 ug via BUCCAL
  Filled 2020-10-04: qty 1

## 2020-10-04 MED ORDER — OXYCODONE HCL 5 MG PO TABS: 5.0000 mg | ORAL_TABLET | ORAL | Status: DC | PRN

## 2020-10-04 MED ORDER — OXYCODONE-ACETAMINOPHEN 5-325 MG PO TABS
1.0000 | ORAL_TABLET | ORAL | Status: DC | PRN
Start: 2020-10-04 — End: 2020-10-04

## 2020-10-04 MED ORDER — ACETAMINOPHEN 325 MG PO TABS: 650.0000 mg | ORAL_TABLET | ORAL | Status: DC | PRN

## 2020-10-04 MED ORDER — SOD CITRATE-CITRIC ACID 500-334 MG/5ML PO SOLN: 30.0000 mL | ORAL | Status: DC | PRN

## 2020-10-04 MED ORDER — FENTANYL-BUPIVACAINE-NACL 0.5-0.125-0.9 MG/250ML-% EP SOLN
EPIDURAL | Status: DC | PRN
  Administered 2020-10-04: 12 mL/h via EPIDURAL

## 2020-10-04 MED ORDER — LACTATED RINGERS IV SOLN
500.0000 mL | Freq: Once | INTRAVENOUS | Status: AC
  Administered 2020-10-04: 500 mL via INTRAVENOUS

## 2020-10-04 MED ORDER — ONDANSETRON HCL 4 MG/2ML IJ SOLN: 4.0000 mg | Freq: Four times a day (QID) | INTRAMUSCULAR | Status: DC | PRN

## 2020-10-04 MED ORDER — OXYTOCIN BOLUS FROM INFUSION
333.0000 mL | Freq: Once | INTRAVENOUS | Status: AC
  Administered 2020-10-04: 333 mL via INTRAVENOUS

## 2020-10-04 NOTE — Discharge Summary (Addendum)
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 yo G2P1001 at [redacted]w[redacted]d was admitted to the hospital 10/03/2020 for induction of labor.  Indication for induction:  decreased fetal movement and polyhydramnios when she presented to the MAU. Patient had a labor course requiring an ECV once on L&D due to transverse fetal positioning as noted on u/s as well; once cephalic presentation was achieved she had an abd binder placed and received a dose of cytotec. She had AROM in the morning followed by vag del at 1101am. Membrane Rupture Time/Date: 6:20 AM ,10/04/2020   Delivery Method:Vaginal, Spontaneous  Episiotomy: None  Lacerations:  None  Details of delivery can be found in separate delivery note.  Patient had a routine postpartum course. Patient is discharged home 10/05/20.  Newborn Data: Birth date:10/04/2020  Birth time:11:01 AM  Gender:Female  Living status:Living  Apgars:9 ,9  Weight:3700 g (8lb 2.5oz)  Magnesium Sulfate received: No BMZ received: No Rhophylac:Yes MMR:N/A T-DaP:Given prenatally Flu: N/A Transfusion:No  Physical exam  Vitals:   10/04/20 1741 10/04/20 2145 10/05/20 0153 10/05/20 0542  BP: 113/73 95/62 104/63 108/67  Pulse: 68 85 70 65  Resp:  $Remo'17 18 18 18  'JfFSN$ Temp: 98.4 F (36.9 C) 98.2 F (36.8 C) 98 F (36.7 C) 99.3 F (37.4 C)  TempSrc: Oral Oral Oral Oral  SpO2: 99% 100% 98% 99%   General: alert, cooperative, and no distress Lochia: appropriate Uterine Fundus: firm DVT Evaluation: No evidence of DVT seen on physical exam. Labs: Lab Results  Component Value Date   WBC 6.2 10/04/2020   HGB 10.6 (L) 10/04/2020   HCT 32.3 (L) 10/04/2020   MCV 96.7 10/04/2020   PLT 131 (L) 10/04/2020   No flowsheet data found. Edinburgh Score: Edinburgh Postnatal Depression Scale Screening Tool 10/05/2020  I have been able to laugh and see the funny side of things. 0  I have looked forward with enjoyment to things. 0  I have blamed myself unnecessarily when things went wrong. 0  I have been anxious or worried for no good reason. 0  I have felt scared or panicky for no good reason. 0  Things have been getting on top of me. 0  I have been so unhappy that I have had difficulty sleeping. 0  I have felt sad or miserable. 0  I have been so unhappy that I have been crying. 0  The thought of harming myself has occurred to me. 0  Edinburgh Postnatal Depression Scale Total 0     After visit meds:  Allergies as of 10/05/2020       Reactions   Adhesive [  tape] Rash        Medication List     STOP taking these medications    Comfort Fit Maternity Supp Med Misc   docusate sodium 100 MG capsule Commonly known as: Colace   prenatal multivitamin Tabs tablet   prenatal vitamin w/FE, FA 27-1 MG Tabs tablet   Probiotic-10 Caps       TAKE these medications    acetaminophen 325 MG tablet Commonly known as: Tylenol Take 2 tablets (650 mg total) by mouth every 4 (four) hours as needed (for pain scale < 4).   ferrous sulfate 325 (65 FE) MG tablet Commonly known as: FerrouSul Take 1 tablet (325 mg total) by mouth every other day.   ibuprofen 200 MG tablet Commonly known as: ADVIL Take 3 tablets (600 mg total) by mouth every 6  (six) hours as needed.   norethindrone 0.35 MG tablet Commonly known as: MICRONOR Take 1 tablet (0.35 mg total) by mouth daily. Start taking on: October 24, 2020   senna-docusate 8.6-50 MG tablet Commonly known as: Senokot-S Take 2 tablets by mouth daily.         Discharge home in stable condition Infant Feeding: Bottle and Breast Infant Disposition:home with mother Discharge instruction: per After Visit Summary and Postpartum booklet. Activity: Advance as tolerated. Pelvic rest for 6 weeks.  Diet: routine diet Future Appointments: Future Appointments  Date Time Provider [place]  10/07/2020  1:50 PM Rasch, Artist Pais, NP [facility] None  10/13/2020  7:00 AM [facility] [place] [facility] None   Follow up Visit: Myrtis Ser, CNM  P [facility] Please schedule this patient for Postpartum visit in: 4 weeks with the following provider: Any provider  In-Person  For C/S patients schedule nurse incision check in weeks 2 weeks: no  Low risk pregnancy complicated by: polyhydamnios  Delivery mode:  SVD  Anticipated Birth Control:  POPs  PP Procedures needed: none  Schedule Integrated [place] visit: no   10/05/2020 Pearla Dubonnet, MD  CNM attestation I have seen and examined this patient and agree with above documentation in the resident's note.   Kristi [name] is a 35 y.o. G2P2002 s/p vag del.   Pain is well controlled.  Plan for birth control is oral progesterone-only contraceptive.  Method of Feeding: breast  PE:  BP 108/67 (BP Location: Right Arm)   Pulse 65   Temp 99.3 F (37.4 C) (Oral)   Resp 18   LMP 12/31/2019 (Exact Date)   SpO2 99%   Breastfeeding Unknown  Fundus firm  Recent Labs    10/03/20 2059 10/04/20 1150  HGB 11.1* 10.6*  HCT 33.8* 32.3*     Plan: discharge today - postpartum care discussed - f/u clinic in 4 weeks for postpartum visit   Myrtis Ser, CNM 9:09 AM 10/05/2020

## 2020-10-04 NOTE — Progress Notes (Signed)
Patient ID: Kristi [name], female   DOB: Feb 28, 1985, 35 y.o.   MRN: [id number]  In to introduce myself to pt and family; just beginning to feel the urge to push; epidural working well  BP 95/54; P 68 FHR 120-130s, +accels, no decels Ctx q 2-3 mins spont Cx C/C/vtx 0 per RN at 1000  IUP@ 39.5wks End 1st stage  Will begin pushing w ctx Anticipate vag del  Arabella Merles CNM 10/04/2020

## 2020-10-04 NOTE — Progress Notes (Signed)
admitted for induction of labor due to decreased fetal movement and polyhydramnios.  Subjective: Pt with mild irregular cramping.  Objective: BP (!) 122/95   Pulse (!) 105   Temp 98.5 F (36.9 C) (Oral)   Resp 16   LMP 12/31/2019 (Exact Date)  No intake/output data recorded. No intake/output data recorded.  FHT:  FHR: 135 bpm, variability: moderate,  accelerations:  Present,  decelerations:  Absent UC:   irregular, every 2-10 minutes  Fetal position confirmed as oblique, head to maternal RIGHT by ultrasound. Pt up to bathroom and Dr Alysia Penna called.  Dr Alysia Penna came to bedside and discussed options, including ECV, primary cesarean section, or possible breech vaginal delivery. Pt elected to have ECV. Dr Alysia Penna performed bedside US and vertex position was noted prior to ECV.  Binder was placed on pt abdomen at this time.  SVE:   Dilation: 3.5 Effacement (%): Thick Station: Ballotable Exam by:: Madalyn Rob, RN   Labs: Lab Results  Component Value Date   WBC 6.1 10/03/2020   HGB 11.1 (L) 10/03/2020   HCT 33.8 (L) 10/03/2020   MCV 96.6 10/03/2020   PLT 139 (L) 10/03/2020    Assessment / Plan: Induction of labor due to decreased fetal movement and polyhydramnios GBS negative Confirmed vertex position  Labor:  With confirmed vertex position, begin IOL with Cytotec buccal dose, consider AROM and Pitocin as needed. Preeclampsia:   n/a Fetal Wellbeing:  Category I Pain Control:  Labor support without medications I/D:   GBS neg Anticipated MOD:  NSVD  Sharen Counter 10/04/2020, 1:31 AM

## 2020-10-04 NOTE — Anesthesia Preprocedure Evaluation (Signed)
Anesthesia Evaluation  Patient identified by MRN, date of birth, ID band Patient awake    Reviewed: Allergy & Precautions, NPO status , Patient's Chart, lab work & pertinent test results  Airway Mallampati: II  TM Distance: >3 FB Neck ROM: Full    Dental no notable dental hx. (+) Teeth Intact, Dental Advisory Given   Pulmonary neg pulmonary ROS,    Pulmonary exam normal breath sounds clear to auscultation       Cardiovascular Exercise Tolerance: Good Normal cardiovascular exam Rhythm:Regular Rate:Normal     Neuro/Psych negative neurological ROS     GI/Hepatic negative GI ROS, Neg liver ROS,   Endo/Other  negative endocrine ROS  Renal/GU negative Renal ROS     Musculoskeletal negative musculoskeletal ROS (+)   Abdominal   Peds  Hematology negative hematology ROS (+) Lab Results      Component                Value               Date                      WBC                      6.1                 10/03/2020                HGB                      11.1 (L)            10/03/2020                HCT                      33.8 (L)            10/03/2020                MCV                      96.6                10/03/2020                PLT                      139 (L)             10/03/2020              Anesthesia Other Findings   Reproductive/Obstetrics (+) Pregnancy                             Anesthesia Physical Anesthesia Plan  ASA: 2  Anesthesia Plan: Epidural   Post-op Pain Management:    Induction:   PONV Risk Score and Plan:   Airway Management Planned:   Additional Equipment:   Intra-op Plan:   Post-operative Plan:   Informed Consent: I have reviewed the patients History and Physical, chart, labs and discussed the procedure including the risks, benefits and alternatives for the proposed anesthesia with the patient or authorized representative who has indicated his/her  understanding and acceptance.       Plan Discussed with:   Anesthesia Plan Comments: (39.5  Wk G2P1 w gestational thrombocytopenia (1390 and Polyhydramnios for LEA)        Anesthesia Quick Evaluation

## 2020-10-04 NOTE — Progress Notes (Signed)
Kristi [name] is a 35 y.o. G2P1001 at [redacted]w[redacted]d admitted for induction of labor due to decreased fetal movement and polyhydramnios.  Subjective: Pt feeling mild contractions. Family in room for support.  Objective: BP 121/76   Pulse 73   Temp 98.7 F (37.1 C) (Oral)   Resp 16   LMP 12/31/2019 (Exact Date)   SpO2 98%  No intake/output data recorded. No intake/output data recorded.  FHT:  FHR: 135 bpm, variability: moderate,  accelerations:  Present,  decelerations:  Absent UC:   regular, every 2-4 minutes SVE:   5/90/-2, vertex AROM with copious amount of clear fluid. Pt tolerated well.  Labs: Lab Results  Component Value Date   WBC 6.1 10/03/2020   HGB 11.1 (L) 10/03/2020   HCT 33.8 (L) 10/03/2020   MCV 96.6 10/03/2020   PLT 139 (L) 10/03/2020    Assessment / Plan: G2P1 at [redacted]w[redacted]d with IOL for decreased fetal movement Polyhydramnios S/P  AROM   Labor: Discussed options with pt including expectant management , AROM, or Pitocin. Pt elects to have AROM performed.   Preeclampsia:   N/a Fetal Wellbeing:  Category I Pain Control:  Labor support without medications I/D:  n/a Anticipated MOD:  NSVD  Sharen Counter 10/04/2020, 10:19 AM

## 2020-10-04 NOTE — Anesthesia Procedure Notes (Signed)
Epidural Patient location during procedure: OB Start time: 10/04/2020 8:10 AM End time: 10/04/2020 8:25 AM  Staffing Anesthesiologist: Trevor Iha, MD Performed: anesthesiologist   Preanesthetic Checklist Completed: patient identified, IV checked, site marked, risks and benefits discussed, surgical consent, monitors and equipment checked, pre-op evaluation and timeout performed  Epidural Patient position: sitting Prep: DuraPrep and site prepped and draped Patient monitoring: continuous pulse ox and blood pressure Approach: midline Location: L3-L4 Injection technique: LOR air  Needle:  Needle type: Tuohy  Needle gauge: 18 G Needle length: 9 cm and 9 Needle insertion depth: 5 cm Catheter type: closed end flexible Catheter size: 20 Guage Catheter at skin depth: 11 cm Test dose: negative  Assessment Events: blood not aspirated, injection not painful, no injection resistance, no paresthesia and negative IV test  Additional Notes Patient identified. Risks/Benefits/Options discussed with patient including but not limited to bleeding, infection, nerve damage, paralysis, failed block, incomplete pain control, headache, blood pressure changes, nausea, vomiting, reactions to medication both or allergic, itching and postpartum back pain. Confirmed with bedside nurse the patient's most recent platelet count. Confirmed with patient that they are not currently taking any anticoagulation, have any bleeding history or any family history of bleeding disorders. Patient expressed understanding and wished to proceed. All questions were answered. Sterile technique was used throughout the entire procedure. Please see nursing notes for vital signs. Test dose was given through epidural needle and negative prior to continuing to dose epidural or start infusion. Warning signs of high block given to the patient including shortness of breath, tingling/numbness in hands, complete motor block, or any concerning  symptoms with instructions to call for help. Patient was given instructions on fall risk and not to get out of bed. All questions and concerns addressed with instructions to call with any issues.   1 Attempt (S) . Patient tolerated procedure well.

## 2020-10-04 NOTE — Lactation Note (Addendum)
This note was copied from a baby's chart. Lactation Consultation Note  Patient Name: Kristi [name] [id number] Date: 10/04/2020 Reason for consult: Initial assessment;Mother's request;Difficult latch;Term Age:34 hours  Infant tongue sucking. LC worked with her to open wide enough to get a deep latch.   Mom compression stripe with latch at first but resolved pain diminished to zero with changes made.  Mom to stimulate breast with massage and hand expression before latching.  Plan 1 To feed based on cues 8-12x in 24hr period  2. Mom to offer breast and look for signs of milk transfer.  3. I and O sheet reviewed  4 LC brochure of inpatient and outpatient services reviewed.  All questions answered at the end of the visit.    Maternal Data Has patient been taught Hand Expression?: Yes Does the patient have breastfeeding experience prior to this delivery?: Yes How long did the patient breastfeed?: breast fed first child for 6 weeks  Feeding Mother's Current Feeding Choice: Breast Milk  LATCH Score Latch: Repeated attempts needed to sustain latch, nipple held in mouth throughout feeding, stimulation needed to elicit sucking reflex.  Audible Swallowing: Spontaneous and intermittent  Type of Nipple: Everted at rest and after stimulation  Comfort (Breast/Nipple): Filling, red/small blisters or bruises, mild/mod discomfort  Hold (Positioning): Assistance needed to correctly position infant at breast and maintain latch.  LATCH Score: 7   Lactation Tools Discussed/Used    Interventions Interventions: Breast feeding basics reviewed;Breast compression;Assisted with latch;Adjust position;Skin to skin;Support pillows;Breast massage;Position options;Hand express;Expressed milk;Education  Discharge WIC Program: No  Consult Status Consult Status: Follow-up Date: 10/05/20 Follow-up type: In-patient    Estha Few  Nicholson-Springer 10/04/2020, 2:09 PM

## 2020-10-04 NOTE — Lactation Note (Signed)
This note was copied from a baby's chart. Lactation Consultation Note  Patient Name: Kristi [name] [id number] Date: 10/04/2020 Reason for consult: L&D Initial assessment;Term Age: LC visit at 45 mins / baby STS with mom /  LC offered to assist / Latch scores 6's x 2 .  No swallows. Mom and dad asked many BF Questions and LC took the time to answered.  Mom aware well will see her again today.   Maternal Data Does the patient have breastfeeding experience prior to this delivery?: Yes How long did the patient breastfeed?: per mom about 6 weeks and had retained placenta parts / milk did not come in well  Feeding Mother's Current Feeding Choice: Breast Milk  LATCH Score Latch: Repeated attempts needed to sustain latch, nipple held in mouth throughout feeding, stimulation needed to elicit sucking reflex.  Audible Swallowing: None  Type of Nipple: Everted at rest and after stimulation  Comfort (Breast/Nipple): Soft / non-tender  Hold (Positioning): Assistance needed to correctly position infant at breast and maintain latch.  LATCH Score: 6   Lactation Tools Discussed/Used    Interventions Interventions: Breast feeding basics reviewed;Education;Assisted with latch;Skin to skin;Hand express;Reverse pressure;Breast compression;Support pillows  Discharge    Consult Status Consult Status: Follow-up Date: 10/04/20 Follow-up type: In-patient    Matilde Sprang Weaver [place] 10/04/2020, 12:34 PM

## 2020-10-05 ENCOUNTER — Ambulatory Visit: Payer: Managed Care, Other (non HMO)

## 2020-10-05 MED ORDER — ACETAMINOPHEN 325 MG PO TABS: 650.0000 mg | ORAL_TABLET | ORAL | Status: DC | PRN

## 2020-10-05 MED ORDER — RHO D IMMUNE GLOBULIN 1500 UNIT/2ML IJ SOSY
300.0000 ug | PREFILLED_SYRINGE | Freq: Once | INTRAMUSCULAR | Status: AC
  Administered 2020-10-05: 300 ug via INTRAVENOUS
  Filled 2020-10-05: qty 2

## 2020-10-05 MED ORDER — IBUPROFEN 200 MG PO TABS: 600.0000 mg | ORAL_TABLET | Freq: Four times a day (QID) | ORAL | Status: DC | PRN

## 2020-10-05 MED ORDER — SENNOSIDES-DOCUSATE SODIUM 8.6-50 MG PO TABS: 2.0000 | ORAL_TABLET | ORAL | 0 refills | Status: DC

## 2020-10-05 MED ORDER — NORETHINDRONE 0.35 MG PO TABS: 1.0000 | ORAL_TABLET | Freq: Every day | ORAL | 3 refills | Status: DC

## 2020-10-05 NOTE — Lactation Note (Signed)
This note was copied from a baby's chart. Lactation Consultation Note  Patient Name: Kristi [name] MCNOB'S Date: 10/05/2020 Reason for consult: Follow-up assessment Age:34 hours   RN requested lactation assistance.  Mother had a few questions prior to discharge.  She wanted to review latching and key points that we discussed earlier today.    Mother interested in attempting to latch, although baby was not showing feeding cues.  Offered to attempt to awaken her.  Baby had a void; changed diaper and attempted to latch.  She was not interested in opening her mouth to initiate a suck.  Chin tug performed, baby latched but not actively sucking.  Stressed the importance of baby showing cues prior to latching.  Parents verbalized understanding.  Reviewed the football hold and suggested mother latch independently.  Again, baby not interested.  Reassurance provided.  Family has our OP phone number for any further questions/concerns after discharge.   Maternal Data    Feeding    LATCH Score Latch: Grasps breast easily, tongue down, lips flanged, rhythmical sucking.  Audible Swallowing: A few with stimulation  Type of Nipple: Everted at rest and after stimulation  Comfort (Breast/Nipple): Soft / non-tender  Hold (Positioning): Assistance needed to correctly position infant at breast and maintain latch.  LATCH Score: 8   Lactation Tools Discussed/Used    Interventions    Discharge Discharge Education: Engorgement and breast care  Consult Status Consult Status: Complete Date: 10/05/20 Follow-up type: Call as needed    Kristi [name] Kristi [name] 10/05/2020, 12:54 PM

## 2020-10-05 NOTE — Anesthesia Postprocedure Evaluation (Signed)
Anesthesia Post Note  Patient: Kristi [name]  Procedure(s) Performed: AN AD HOC LABOR EPIDURAL     Patient location during evaluation: Mother Baby Anesthesia Type: Epidural Level of consciousness: awake, oriented and awake and alert Pain management: pain level controlled Vital Signs Assessment: post-procedure vital signs reviewed and stable Respiratory status: spontaneous breathing, respiratory function stable and nonlabored ventilation Cardiovascular status: stable Postop Assessment: no headache, adequate PO intake, able to ambulate, patient able to bend at knees and no apparent nausea or vomiting Anesthetic complications: no   No notable events documented.  Last Vitals:  Vitals:   10/05/20 0153 10/05/20 0542  BP: 104/63 108/67  Pulse: 70 65  Resp: 18 18  Temp: 36.7 C 37.4 C  SpO2: 98% 99%    Last Pain:  Vitals:   10/05/20 0542  TempSrc: Oral  PainSc:    Pain Goal:                   Faren Florence

## 2020-10-05 NOTE — Lactation Note (Signed)
This note was copied from a baby's chart. Lactation Consultation Note  Patient Name: Kristi [name] DXIPJ'A Date: 10/05/2020 Reason for consult: Follow-up assessment;Term Age:34 hours   P2 mother whose infant is now 29 hours old.  This is a term baby at 39+5 weeks.  Mother breast fed her first child (now 60 years old) for 6 weeks.  Mother requested lactation assistance.  Reviewed breast feeding basics with mother.  Suggested STS and removed baby's shirt.  Mother demonstrated hand expression; one colostrum drop obtained.  Assisted to latch and, with constant stimulation, baby continued to feed for 14 minutes before I left the room.  Encouraged mother to call for assistance throughout the day as needed.   Mother will be returning to work and does not have a DEBP.  Suggested she call her insurance company to determine pump eligibility.  Mother plans to follow through with this.  Father present and asleep on the couch.   Maternal Data Has patient been taught Hand Expression?: Yes How long did the patient breastfeed?: 6 weeks  Feeding Mother's Current Feeding Choice: Breast Milk  LATCH Score Latch: Grasps breast easily, tongue down, lips flanged, rhythmical sucking.  Audible Swallowing: A few with stimulation  Type of Nipple: Everted at rest and after stimulation  Comfort (Breast/Nipple): Soft / non-tender  Hold (Positioning): Assistance needed to correctly position infant at breast and maintain latch.  LATCH Score: 8   Lactation Tools Discussed/Used    Interventions Interventions: Breast feeding basics reviewed;Assisted with latch;Skin to skin;Breast massage;Hand express;Adjust position;Support pillows;Education;Expressed milk;Position options  Discharge Pump: Personal (Mother will call insurance for a DEBP) WIC Program: No  Consult Status Consult Status: Follow-up Date: 10/06/20 Follow-up type: In-patient    Kristi [name] Kristi [name] 10/05/2020, 4:43 AM

## 2020-10-06 LAB — RH IG WORKUP (INCLUDES ABO/RH)
Fetal Screen: NEGATIVE
Gestational Age(Wks): 36.8
Unit division: 0

## 2020-10-07 ENCOUNTER — Encounter: Payer: Managed Care, Other (non HMO) | Admitting: Obstetrics and Gynecology

## 2020-10-07 ENCOUNTER — Other Ambulatory Visit: Payer: Managed Care, Other (non HMO)

## 2020-10-07 ENCOUNTER — Ambulatory Visit: Payer: Managed Care, Other (non HMO)

## 2020-10-13 ENCOUNTER — Inpatient Hospital Stay (HOSPITAL_COMMUNITY): Payer: Managed Care, Other (non HMO)

## 2020-10-13 ENCOUNTER — Inpatient Hospital Stay (HOSPITAL_COMMUNITY)
Admission: AD | Admit: 2020-10-13 | Payer: Managed Care, Other (non HMO) | Source: Home / Self Care | Admitting: Obstetrics and Gynecology

## 2020-10-18 ENCOUNTER — Telehealth (HOSPITAL_COMMUNITY): Payer: Self-pay | Admitting: *Deleted

## 2020-10-18 NOTE — Telephone Encounter (Signed)
Mom reports feeling irritation vaginally with a foul odor. Encouraged to call into OB office for guidance today. Mom agreed. EPDS=2 (hospital score=0) Reports lower milk supply, so lactation phone # given to mom. Mom reports baby is well. Feeding well, peeing and pooping without difficulty. Sleeps in bassinet in mom's room on back. No concerns about baby.  Duffy Rhody, RN 10-18-2020 at 4:12pm

## 2020-10-19 ENCOUNTER — Telehealth: Payer: Self-pay

## 2020-10-19 DIAGNOSIS — N760 Acute vaginitis: Secondary | ICD-10-CM

## 2020-10-19 DIAGNOSIS — B9689 Other specified bacterial agents as the cause of diseases classified elsewhere: Secondary | ICD-10-CM

## 2020-10-19 MED ORDER — METRONIDAZOLE 500 MG PO TABS: 500.0000 mg | ORAL_TABLET | Freq: Two times a day (BID) | ORAL | 0 refills | Status: DC

## 2020-10-19 NOTE — Telephone Encounter (Signed)
Pt called stating that she is experiencing a vaginal odor and irritation. States she is unable to really tell if there is a discharge due to spotting and bleeding. She is 2 weeks postpartum and currently breastfeeding. Pt wanted to know if it is normal to get BV after delivery. Advised patient it is not uncommon and that it can occur. Pt is not currently sexually active. Requests Rx to treat. Will send in Rx for flagyl to treat. Advised should she still be symptomatic at her PP visit will collect swab. Pt is agreeable to this. Rx sent to patient's preferred pharmacy.

## 2020-11-16 ENCOUNTER — Encounter: Payer: Self-pay | Admitting: Advanced Practice Midwife

## 2020-11-16 ENCOUNTER — Other Ambulatory Visit: Payer: Self-pay

## 2020-11-16 ENCOUNTER — Ambulatory Visit (INDEPENDENT_AMBULATORY_CARE_PROVIDER_SITE_OTHER): Payer: Managed Care, Other (non HMO) | Admitting: Advanced Practice Midwife

## 2020-11-16 DIAGNOSIS — N898 Other specified noninflammatory disorders of vagina: Secondary | ICD-10-CM

## 2020-11-16 DIAGNOSIS — K429 Umbilical hernia without obstruction or gangrene: Secondary | ICD-10-CM

## 2020-11-16 DIAGNOSIS — Z3009 Encounter for other general counseling and advice on contraception: Secondary | ICD-10-CM

## 2020-11-16 NOTE — Progress Notes (Signed)
Post Partum Visit Note  Kristi [name] is a 35 y.o. [id number] female who presents for a postpartum visit. She is 6 weeks postpartum following a normal spontaneous vaginal delivery.  I have fully reviewed the prenatal and intrapartum course. The delivery was at [redacted]w[redacted]d gestational weeks.  Anesthesia: epidural. Postpartum course has been unremarkable. Baby is doing well. Baby is feeding by both breast and bottle - Similac 360 Totalcare . Bleeding changing a maxi pad every 4 hours. Bowel function is normal. Bladder function is normal. Patient is not sexually active. Contraception method is oral progesterone-only contraceptive. Postpartum depression screening: negative. EPDS: 0   The pregnancy intention screening data noted above was reviewed. Potential methods of contraception were discussed. The patient elected to proceed with POPs which patient is already taking.   Edinburgh Postnatal Depression Scale - 11/16/20 0944       Edinburgh Postnatal Depression Scale:  In the Past 7 Days   I have been able to laugh and see the funny side of things. 0    I have looked forward with enjoyment to things. 0    I have blamed myself unnecessarily when things went wrong. 0    I have been anxious or worried for no good reason. 0    I have felt scared or panicky for no good reason. 0    Things have been getting on top of me. 1    I have been so unhappy that I have had difficulty sleeping. 0    I have felt sad or miserable. 0    I have been so unhappy that I have been crying. 0    The thought of harming myself has occurred to me. 0    Edinburgh Postnatal Depression Scale Total 1              The following portions of the patient's history were reviewed and updated as appropriate: allergies, current medications, past family history, past medical history, past social history, past surgical history, and problem list.  Review of Systems Pertinent items noted in HPI and remainder of comprehensive ROS  otherwise negative.    Objective:  BP 115/79   Pulse 80   Ht 5' 5.5" (1.664 m)   Wt 156 lb (70.8 kg)   LMP 11/14/2020 (Exact Date)   Breastfeeding Yes   BMI 25.56 kg/m    VS reviewed, nursing note reviewed,  Constitutional: well developed, well nourished, no distress HEENT: normocephalic CV: normal rate Pulm/chest wall: normal effort Abdomen: soft Neuro: alert and oriented x 3 Skin: warm, dry Psych: affect normal    Assessment:  1. Postpartum care following vaginal delivery --Doing well, bonding well with infant, breast and bottle feeding (discussed ways to increase milk supply including more frequent feeding/pumping), and good support at home  2. Encounter for counseling regarding contraception --Pt may return for IUD, plans to think about it more and will continue POPs for now.  3. Vaginal discharge --Improved, then pt had menses and is still bleeding today so desires to come back for testing in 2 weeks.  Not concerned about STI, but wants BV testing.  Discussed BV as an imbalance and we do not recommend testing for asymptomatic patients but pt had symptoms last week and is now worried. --Nurse visit in 2 weeks for vaginal swab   4. Umbilical hernia without obstruction and without gangrene --Small hernia above umbilicus, not visible without abdominal strain.  Pt denies pain. --Pt to f/u in a few months if  desired, consider referral to general surgery if still persists or is bothersome. --No measurable diastasis recti on exam today but discussed breathing/yoga/core exercises recommended for postpartum.     Plan:   Essential components of care per ACOG recommendations:  1.  Mood and well being: Patient with negative depression screening today. Reviewed local resources for support.  - Patient does not use tobacco.  - hx of drug use? No    2. Infant care and feeding:  -Patient currently breastmilk feeding? Yes If breastmilk feeding discussed return to work and pumping.  If needed, patient was provided letter for work to allow for every 2-3 hr pumping breaks, and to be granted a private location to express breastmilk and refrigerated area to store breastmilk. Reviewed importance of draining breast regularly to support lactation. -Social determinants of health (SDOH) reviewed in EPIC. No concerns   3. Sexuality, contraception and birth spacing - Patient does not want a pregnancy in the next year.   - --Discussed pt contraceptive plans and reviewed contraceptive methods based on pt preferences and effectiveness.  Pt prefers to continue POPs right now but considering IUD in a few months.    - Discussed birth spacing of 18 months  4. Sleep and fatigue -Encouraged family/partner/community support of 4 hrs of uninterrupted sleep to help with mood and fatigue  5. Physical Recovery  - Discussed patients delivery without complications - Patient had no laceration, perineal healing reviewed. Patient expressed understanding - Patient has urinary incontinence? No  6.  Health Maintenance - Last pap smear done 09/22/2019 and was normal with negative HPV.   7. Chronic Disease, n/a - PCP follow up  Sharen Counter, CNM [facility] [employer], [facility]

## 2020-11-23 ENCOUNTER — Ambulatory Visit: Payer: Managed Care, Other (non HMO)

## 2020-12-28 ENCOUNTER — Other Ambulatory Visit: Payer: Self-pay | Admitting: Student

## 2021-01-10 ENCOUNTER — Encounter: Payer: Self-pay | Admitting: Physician Assistant

## 2021-01-19 ENCOUNTER — Encounter: Payer: Self-pay | Admitting: Physician Assistant

## 2021-01-19 ENCOUNTER — Ambulatory Visit (INDEPENDENT_AMBULATORY_CARE_PROVIDER_SITE_OTHER): Payer: Managed Care, Other (non HMO) | Admitting: Physician Assistant

## 2021-01-19 VITALS — BP 98/60 | HR 68 | Ht 64.17 in | Wt 157.5 lb

## 2021-01-19 DIAGNOSIS — K625 Hemorrhage of anus and rectum: Secondary | ICD-10-CM | POA: Diagnosis not present

## 2021-01-19 DIAGNOSIS — K5900 Constipation, unspecified: Secondary | ICD-10-CM | POA: Diagnosis not present

## 2021-01-19 DIAGNOSIS — K602 Anal fissure, unspecified: Secondary | ICD-10-CM

## 2021-01-19 MED ORDER — AMBULATORY NON FORMULARY MEDICATION: 1 refills | Status: DC

## 2021-01-19 NOTE — Progress Notes (Signed)
I agree with the assessment and plan as outlined by Ms. Lemmon. 

## 2021-01-19 NOTE — Progress Notes (Signed)
Chief Complaint: Constipation and rectal bleeding  HPI:    Kristi [name] is a 35 year old African-American female with a past medical history as listed below, who presents to clinic today for complaint of rectal bleeding and constipation.    Per chart review patient just had a baby in September.    Today, patient presents to clinic accompanied by her husband and baby daughter.  Tells me that over the past few months she has been having some pain when she passes a stool and sees some bright red blood on the toilet paper and in with her stool.  Does tell me that she only has a bowel movement every 3 to 4 days which is her normal and so typically it is large.  Describes that she had previous bleeding like this about 6 years ago and had a sigmoidoscopy and was told it was normal.  Explains that she has been googling and thinks she may have a "tear".  She has not tried placing any products back there.    Family history positive for colon cancer in her grandfather diagnosed in his 68s.    Denies fever, chills, weight loss, change in bowel habits, nausea, vomiting or abdominal pain.  Past Medical History:  Diagnosis Date   Pap smear abnormality of cervix     Past Surgical History:  Procedure Laterality Date   COLPOSCOPY  2013-2014   DILATION AND EVACUATION N/A 07/20/2017   Procedure: DILATATION AND EVACUATION;  Surgeon: Tereso Newcomer, MD;  Location: [facility];  Service: Gynecology;  Laterality: N/A;   SIGMOIDOSCOPY      No current outpatient medications on file.   No current facility-administered medications for this visit.    Allergies as of 01/19/2021 - Review Complete 01/19/2021  Allergen Reaction Noted   Adhesive [tape] Rash 05/15/2017    Family History  Problem Relation Age of Onset   Hypertension Mother    Stroke Mother        tia   Hypertension Father    Cancer Maternal Grandfather    Cancer Paternal Grandmother        ovarian   Diabetes Paternal Grandmother     Social  History   Socioeconomic History   Marital status: Single    Spouse name: Not on file   Number of children: Not on file   Years of education: Not on file   Highest education level: Not on file  Occupational History   Not on file  Tobacco Use   Smoking status: Never   Smokeless tobacco: Never  Vaping Use   Vaping Use: Never used  Substance and Sexual Activity   Alcohol use: Not Currently   Drug use: No   Sexual activity: Yes    Partners: Male    Birth control/protection: None    Comment: Pregnant   Other Topics Concern   Not on file  Social History Narrative   Not on file   Social Determinants of Health   Financial Resource Strain: Not on file  Food Insecurity: Not on file  Transportation Needs: Not on file  Physical Activity: Not on file  Stress: Not on file  Social Connections: Not on file  Intimate Partner Violence: Not on file    Review of Systems:    Constitutional: No weight loss, fever or chills Skin: No rash Cardiovascular: No chest pain  Respiratory: No SOB  Gastrointestinal: See HPI and otherwise negative Genitourinary: No dysuria  Neurological: No headache, dizziness or syncope Musculoskeletal: No new muscle  or joint pain Hematologic: No bruising Psychiatric: No history of depression or anxiety   Physical Exam:  Vital signs: BP 98/60 (BP Location: Left Arm, Patient Position: Sitting, Cuff Size: Normal)    Pulse 68    Ht 5' 4.17" (1.63 m) Comment: height measured without shoes   Wt 157 lb 8 oz (71.4 kg)    LMP 01/08/2021    Breastfeeding Yes    BMI 26.89 kg/m    Constitutional:   Pleasant AA female appears to be in NAD, Well developed, Well nourished, alert and cooperative Head:  Normocephalic and atraumatic. Eyes:   PEERL, EOMI. No icterus. Conjunctiva pink. Ears:  Normal auditory acuity. Neck:  Supple Throat: Oral cavity and pharynx without inflammation, swelling or lesion.  Respiratory: Respirations even and unlabored. Lungs clear to  auscultation bilaterally.   No wheezes, crackles, or rhonchi.  Cardiovascular: Normal S1, S2. No MRG. Regular rate and rhythm. No peripheral edema, cyanosis or pallor.  Gastrointestinal:  Soft, nondistended, nontender. No rebound or guarding. Normal bowel sounds. No appreciable masses or hepatomegaly. Rectal: External: Posterior fissure tender to palpation, hemorrhoid tags; internal: Deferred due to pain Msk:  Symmetrical without gross deformities. Without edema, no deformity or joint abnormality.  Neurologic:  Alert and  oriented x4;  grossly normal neurologically.  Skin:   Dry and intact without significant lesions or rashes. Psychiatric: Demonstrates good judgement and reason without abnormal affect or behaviors.  RELEVANT LABS AND IMAGING: CBC    Component Value Date/Time   WBC 6.2 10/04/2020 1150   RBC 3.34 (L) 10/04/2020 1150   HGB 10.6 (L) 10/04/2020 1150   HGB 10.1 (L) 07/19/2020 1122   HCT 32.3 (L) 10/04/2020 1150   HCT 29.4 (L) 07/19/2020 1122   PLT 131 (L) 10/04/2020 1150   PLT 166 07/19/2020 1122   MCV 96.7 10/04/2020 1150   MCV 93 07/19/2020 1122   MCH 31.7 10/04/2020 1150   MCHC 32.8 10/04/2020 1150   RDW 14.5 10/04/2020 1150   RDW 12.3 07/19/2020 1122   LYMPHSABS 1.7 03/22/2020 1552   EOSABS 0.1 03/22/2020 1552   BASOSABS 0.0 03/22/2020 1552   Assessment: 1.  Anal fissure: With bleeding and pain, posterior at time of exam today, related to chronic constipation below 2.  Constipation: Chronic for the patient with a bowel movement every 3 to 4 days which is large  Plan: 1.  Prescribed Diltiazem ointment to be applied to fissure 3 times daily x6-8 weeks. 2.  Recommend over-the-counter RectiCare lidocaine as needed 3.  Recommend sitz bath's for 15 to 20 minutes 2-3 times a day 4.  We will try to get records from patient's last sigmoidoscopy.  She thinks maybe there were "polyps", discussed that in that case we would need to do a colonoscopy sooner than when she is 46  pending on pathology. 5.  Recommend the patient start MiraLAX once daily and use this chronically for her constipation.  Would also had a Colace 1 tab daily for the next week or so to help stools to be softer when passing. 6.  Patient to follow in clinic with me in 2 to 3 months.  Assigned to Dr. Leonides Schanz this morning.  Hyacinth Meeker, PA-C [facility] 01/19/2021, 9:50 AM

## 2021-01-19 NOTE — Patient Instructions (Signed)
Your provider has prescribed Diltiazem gel for you. Please follow the directions written on your prescription bottle or given to you specifically by your provider. Since this is a specialty medication and is not readily available at most local pharmacies, we have sent your prescription to:  [facility] information is below: Address: [address], [place], [place] [address]  [phone]  *Please DO NOT go directly from our office to pick up this medication! Give the pharmacy 1 day to process the prescription as this is compounded and takes time to make.  OVER THE COUNTER MEDICATION Please purchase the following medications over the counter and take as directed:  Miralax- Dissolve one capful in 8 ounces of water and drink once a day with 1 colace for a week then stop the colace. Recticare with lidocaine as needed.  RECOMMENDATIONS: Follow up in 2-3 months. The schedule is not open that far out yet. Please call mid Janurary. Do sitz baths.  It was great seeing you today! Thank you for entrusting me with your care and choosing [facility].  Hyacinth Meeker, PA  How to Take a [employer] A sitz bath is a warm water bath that may be used to care for your rectum, genital area, or the area between your rectum and genitals (perineum). In a sitz bath, the water only comes up to your hips and covers your buttocks. A sitz bath may be done in a bathtub or with a portable sitz bath that fits over the toilet. Your health care provider may recommend a sitz bath to help: Relieve pain and discomfort after delivering a baby. Relieve pain and itching from hemorrhoids or anal fissures. Relieve pain after certain surgeries. Relax muscles that are sore or tight. How to take a sitz bath Take 3-4 sitz baths a day, or as many as told by your health care provider. Bathtub sitz bath To take a sitz bath in a bathtub: Partially fill a bathtub with warm water. The water should be deep  enough to cover your hips and buttocks when you are sitting in the tub. Follow your health care provider's instructions if you are told to put medicine in the water. Sit in the water. Open the tub drain a little, and leave it open during your bath. Turn on the warm water again, enough to replace the water that is draining out. Keep the water running throughout your bath. This helps keep the water at the right level and temperature. Soak in the water for 15-20 minutes, or as long as told by your health care provider. When you are done, be careful when you stand up. You may feel dizzy. After the sitz bath, pat yourself dry. Do not rub your skin to dry it.  Over-the-toilet sitz bath To take a sitz bath with an over-the-toilet basin: Follow the manufacturer's instructions. Fill the basin with warm water. Follow your health care provider's instructions if you were told to put medicine in the water. Sit on the seat. Make sure the water covers your buttocks and perineum. Soak in the water for 15-20 minutes, or as long as told by your health care provider. After the sitz bath, pat yourself dry. Do not rub your skin to dry it. Clean and dry the basin between uses. Discard the basin if it cracks, or according to the manufacturer's instructions.  Contact a health care provider if: Your pain or itching gets worse. Do not continue with sitz baths if your symptoms get worse.  You have new symptoms. Do not continue with sitz baths until you talk with your health care provider. Summary A sitz bath is a warm water bath in which the water only comes up to your hips and covers your buttocks. A sitz bath may help relieve pain and discomfort after delivering a baby. It also may help with pain and itching from hemorrhoids or anal fissures, or pain after certain surgeries. It can also help to relax muscles that are sore or tight. Take 3-4 sitz baths a day, or as many as told by your health care provider. Soak in  the water for 15-20 minutes. Do not continue with sitz baths if your symptoms get worse. This information is not intended to replace advice given to you by your health care provider. Make sure you discuss any questions you have with your health care provider. Document Revised: 10/02/2019 Document Reviewed: 10/02/2019 Elsevier Patient Education  2022 [employer].

## 2021-06-12 ENCOUNTER — Other Ambulatory Visit: Payer: Self-pay | Admitting: Obstetrics

## 2021-06-14 ENCOUNTER — Other Ambulatory Visit: Payer: Self-pay | Admitting: *Deleted

## 2021-06-14 DIAGNOSIS — N898 Other specified noninflammatory disorders of vagina: Secondary | ICD-10-CM

## 2021-06-14 MED ORDER — FLUCONAZOLE 150 MG PO TABS
150.0000 mg | ORAL_TABLET | Freq: Once | ORAL | 0 refills | Status: AC
Start: 2021-06-14 — End: 2021-06-14

## 2021-06-14 NOTE — Progress Notes (Signed)
TC from pt reporting symptoms of vaginal yeast infection. Postpartum visit 10/2020. Pap UTD. RX Diflucan per protocol. ?

## 2022-04-08 ENCOUNTER — Emergency Department (HOSPITAL_BASED_OUTPATIENT_CLINIC_OR_DEPARTMENT_OTHER)
Admission: EM | Admit: 2022-04-08 | Discharge: 2022-04-08 | Disposition: A | Discharge status: Home / Self Care | Payer: BC Managed Care – PPO | Attending: Emergency Medicine | Admitting: Emergency Medicine

## 2022-04-08 ENCOUNTER — Other Ambulatory Visit: Payer: Self-pay

## 2022-04-08 ENCOUNTER — Encounter (HOSPITAL_BASED_OUTPATIENT_CLINIC_OR_DEPARTMENT_OTHER): Payer: Self-pay

## 2022-04-08 DIAGNOSIS — Y92410 Unspecified street and highway as the place of occurrence of the external cause: Secondary | ICD-10-CM | POA: Insufficient documentation

## 2022-04-08 DIAGNOSIS — R519 Headache, unspecified: Secondary | ICD-10-CM | POA: Diagnosis not present

## 2022-04-08 DIAGNOSIS — M542 Cervicalgia: Secondary | ICD-10-CM | POA: Diagnosis not present

## 2022-04-08 DIAGNOSIS — T148XXA Other injury of unspecified body region, initial encounter: Secondary | ICD-10-CM

## 2022-04-08 DIAGNOSIS — S134XXA Sprain of ligaments of cervical spine, initial encounter: Secondary | ICD-10-CM | POA: Insufficient documentation

## 2022-04-08 DIAGNOSIS — S161XXA Strain of muscle, fascia and tendon at neck level, initial encounter: Secondary | ICD-10-CM | POA: Diagnosis not present

## 2022-04-08 MED ORDER — ACETAMINOPHEN 500 MG PO TABS
1000.0000 mg | ORAL_TABLET | Freq: Once | ORAL | Status: AC
  Administered 2022-04-08: 1000 mg via ORAL
  Filled 2022-04-08: qty 2

## 2022-04-08 MED ORDER — CYCLOBENZAPRINE HCL 10 MG PO TABS: 10.0000 mg | ORAL_TABLET | Freq: Two times a day (BID) | ORAL | 0 refills | Status: DC | PRN
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
## 2022-04-08 NOTE — Discharge Instructions (Signed)
Please make an appointment with a primary care provider I have attached for you or a primary care provider of your choosing.  I have also prescribed you Flexeril you may take at night before bed as this might make you drowsy so please do not operate machinery or drive afterwards.  You may alternate every 6 hours as needed for pain with 1000 mg Tylenol and 400 mg ibuprofen.  If symptoms worsen please return to ER.
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
## 2022-04-19 IMAGING — US US OB COMP LESS 14 WK
1 series · 15 of 25 positions shown · non-contrast
Comparison: 02/27/2020
COMPARISON: 02/27/2020

Addendum:
CLINICAL DATA: Assess fetal viability, 2 yolk sacs identified on in
office ultrasound

EXAM:
OBSTETRIC <14 WK ULTRASOUND
TECHNIQUE: Transabdominal ultrasound was performed for evaluation of the
gestation as well as the maternal uterus and adnexal regions.

[Series 1: us ob comp less 14 wk · 25 acquisitions, 15 frames shown]
[im 1/25]
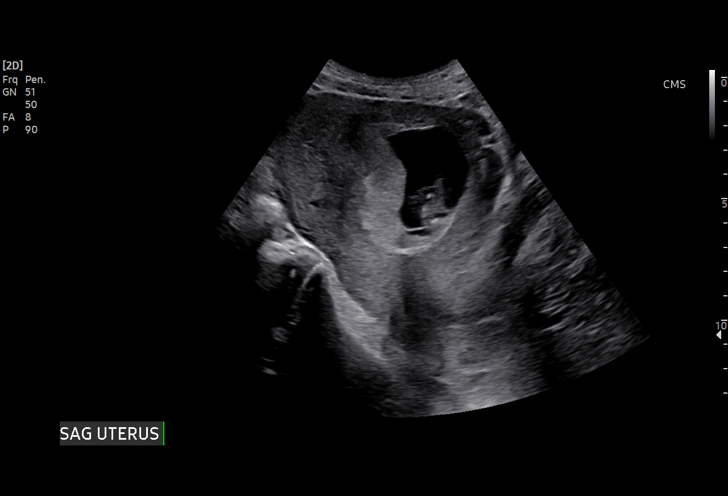
[im 3/25]
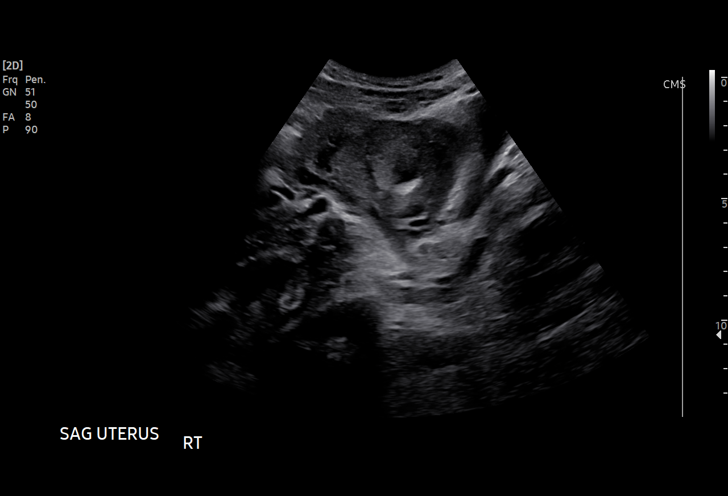
[im 5/25]
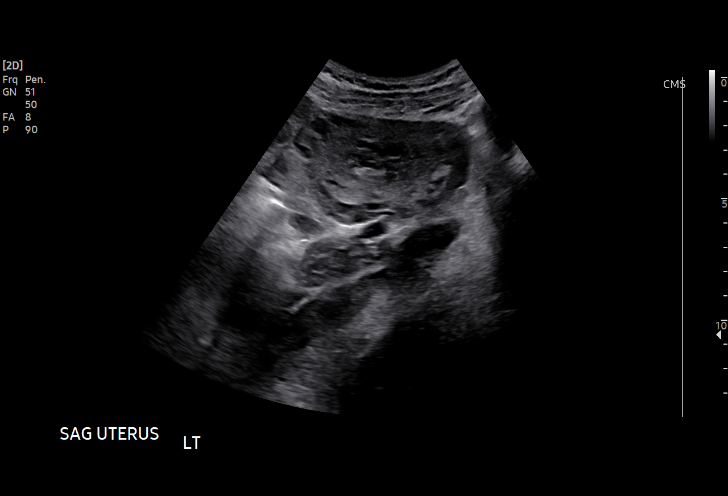
[im 6/25]
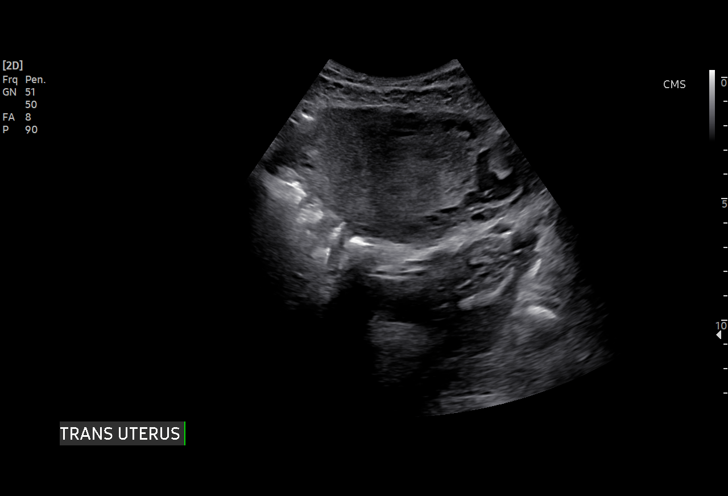
[im 8/25]
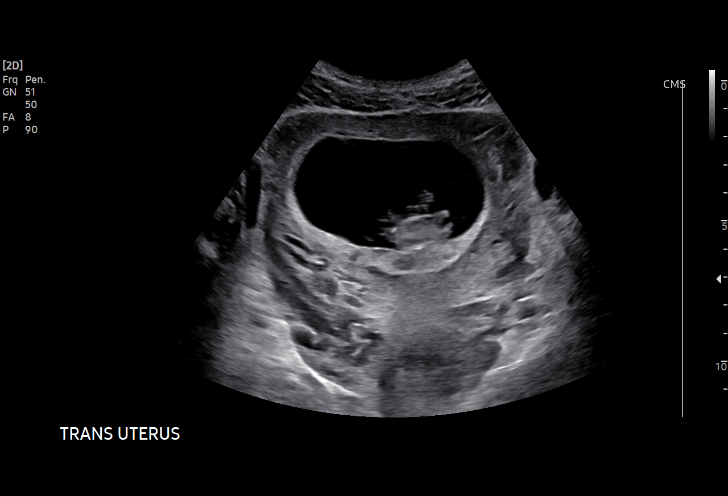
[im 10/25]
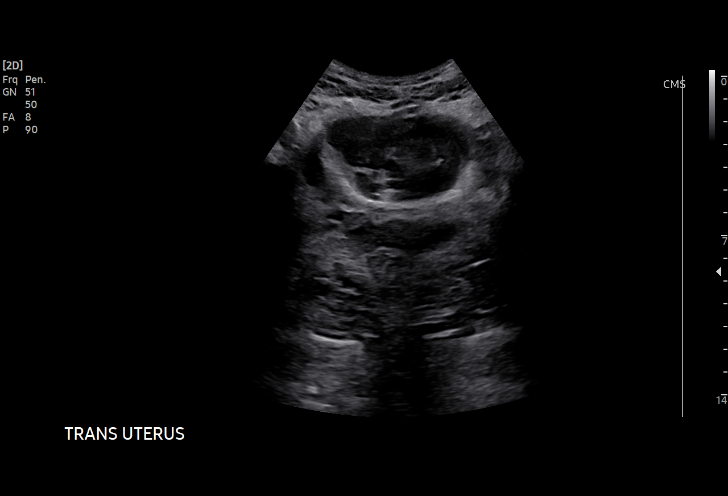
[im 11/25]
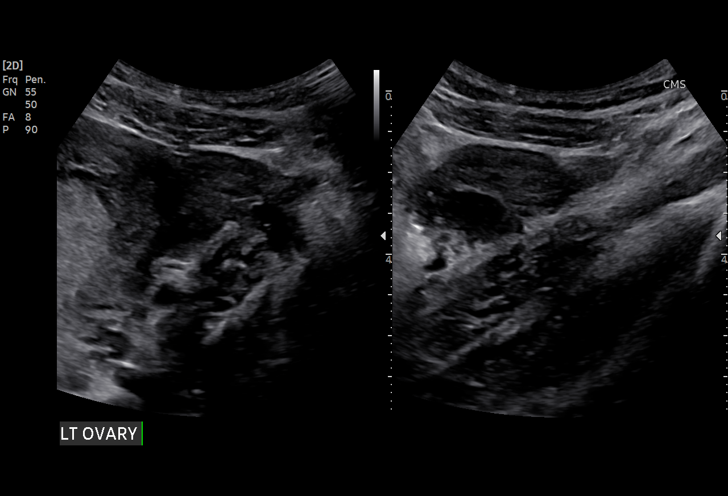
[im 13/25]
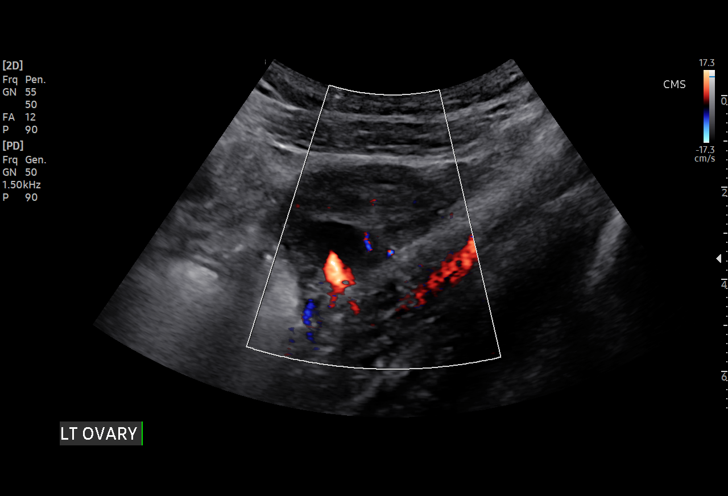
[im 15/25]
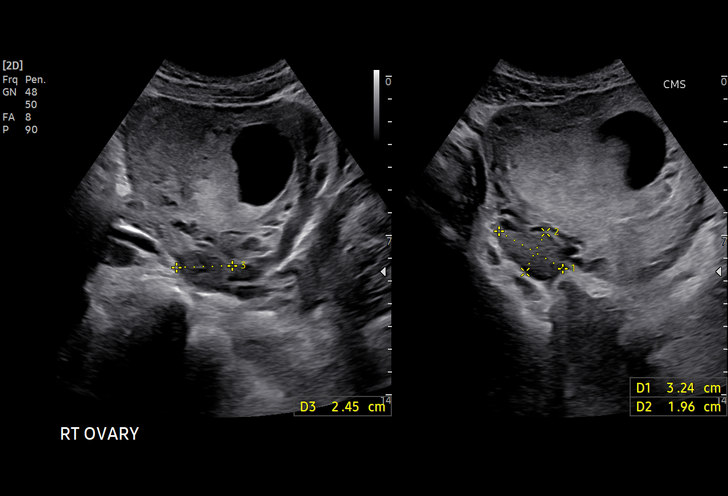
[im 16/25]
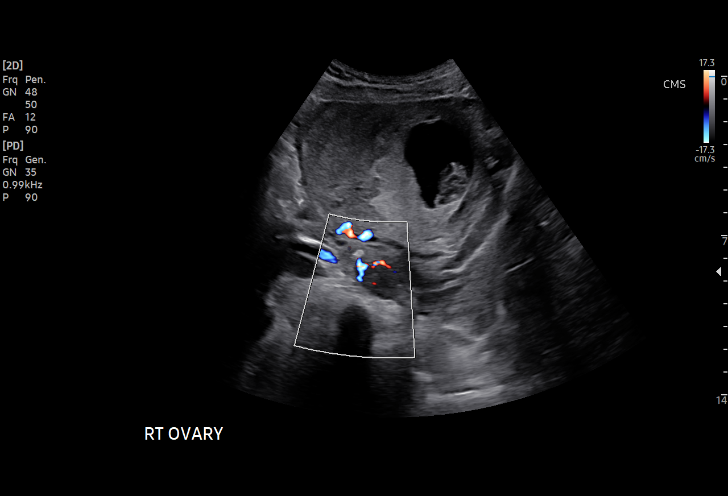
[im 18/25]
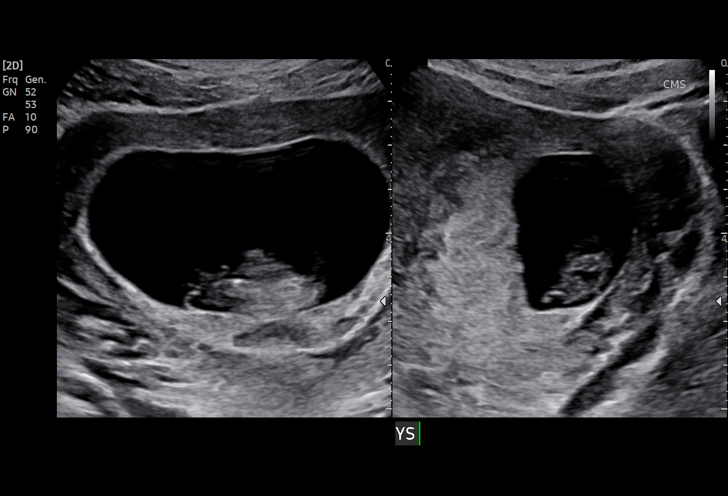
[im 20/25]
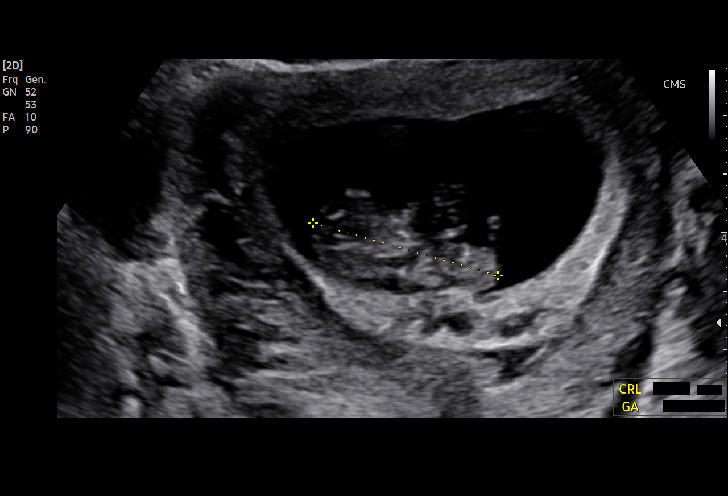
[im 21/25]
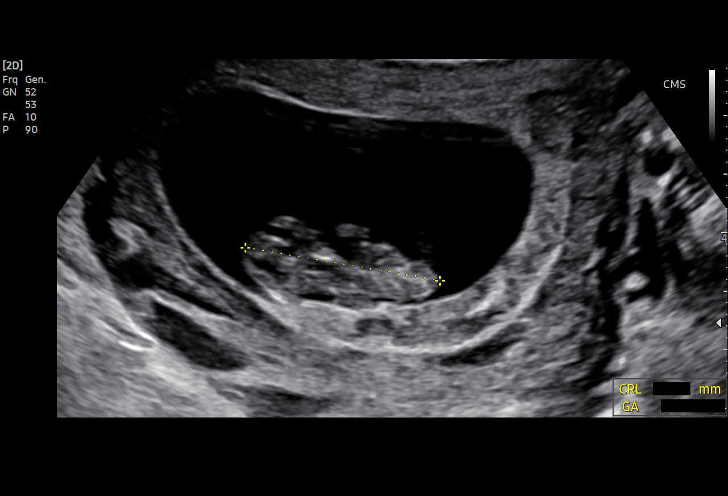
[im 23/25]
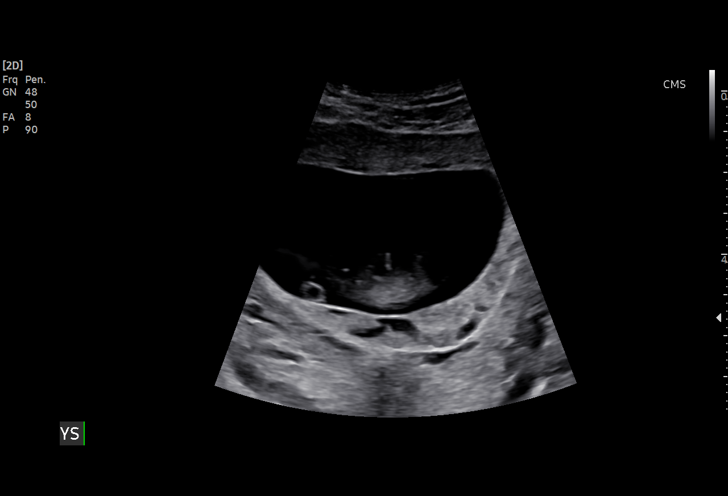
[im 25/25]
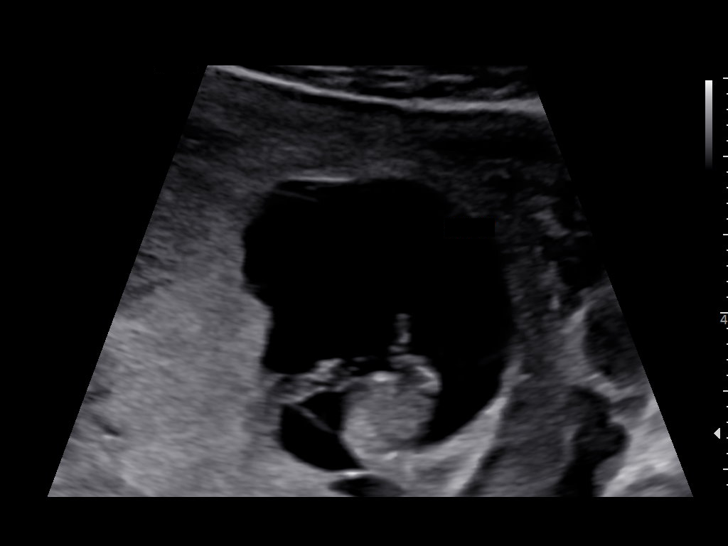

[15 of 25 positions shown; findings below may reference images not displayed]

FINDINGS: Intrauterine gestational sac: Single

Yolk sac:  Single yolk sac visualized

Embryo:  Visualized.

Cardiac Activity: Visualized.

Heart Rate: 173 bpm

CRL: 33.1 mm   16 w 1 d                  US EDC: 10/04/2020

Subchorionic hemorrhage:  None visualized.

Maternal uterus/adnexae: Left ovary measures 2.9 x 1.8 x 1.9 cm in
the right ovary measures 2.5 x 3.2 x 2.0 cm. No adnexal mass or free
fluid.
IMPRESSION: 1. Single live intrauterine pregnancy as above, estimated age 16
weeks and 1 day. Normal appearing single yolk sac identified.

ADDENDUM:
There is a voice recognition error within the body of the report.
The estimated gestational age should be 10 weeks and 1 day, with an
EDD of 10/04/2020.

*** End of Addendum ***
FINDINGS: Intrauterine gestational sac: Single

Yolk sac:  Single yolk sac visualized

Embryo:  Visualized.

Cardiac Activity: Visualized.

Heart Rate: 173 bpm

CRL: 33.1 mm   16 w 1 d                  US EDC: 10/04/2020

Subchorionic hemorrhage:  None visualized.

Maternal uterus/adnexae: Left ovary measures 2.9 x 1.8 x 1.9 cm in
the right ovary measures 2.5 x 3.2 x 2.0 cm. No adnexal mass or free
fluid.
IMPRESSION: 1. Single live intrauterine pregnancy as above, estimated age 16
weeks and 1 day. Normal appearing single yolk sac identified.

## 2022-07-05 ENCOUNTER — Encounter (HOSPITAL_BASED_OUTPATIENT_CLINIC_OR_DEPARTMENT_OTHER): Payer: Self-pay | Admitting: Emergency Medicine

## 2022-07-05 ENCOUNTER — Other Ambulatory Visit: Payer: Self-pay

## 2022-07-05 ENCOUNTER — Emergency Department (HOSPITAL_BASED_OUTPATIENT_CLINIC_OR_DEPARTMENT_OTHER)
Admission: EM | Admit: 2022-07-05 | Discharge: 2022-07-06 | Disposition: A | Discharge status: Home / Self Care | Payer: BC Managed Care – PPO | Attending: Emergency Medicine | Admitting: Emergency Medicine

## 2022-07-05 DIAGNOSIS — M549 Dorsalgia, unspecified: Secondary | ICD-10-CM | POA: Diagnosis not present

## 2022-07-05 DIAGNOSIS — S39012A Strain of muscle, fascia and tendon of lower back, initial encounter: Secondary | ICD-10-CM | POA: Insufficient documentation

## 2022-07-05 DIAGNOSIS — X58XXXA Exposure to other specified factors, initial encounter: Secondary | ICD-10-CM | POA: Insufficient documentation

## 2022-07-05 NOTE — ED Triage Notes (Signed)
Low back pain, mid back Started today Denies weakness, numbness, no issues voiding

## 2022-07-06 ENCOUNTER — Emergency Department (HOSPITAL_BASED_OUTPATIENT_CLINIC_OR_DEPARTMENT_OTHER): Payer: BC Managed Care – PPO | Admitting: Radiology

## 2022-07-06 DIAGNOSIS — M545 Low back pain, unspecified: Secondary | ICD-10-CM | POA: Diagnosis not present

## 2022-07-06 LAB — URINALYSIS, ROUTINE W REFLEX MICROSCOPIC
Bilirubin Urine: NEGATIVE
Glucose, UA: NEGATIVE mg/dL
Hgb urine dipstick: NEGATIVE
Ketones, ur: NEGATIVE mg/dL
Leukocytes,Ua: NEGATIVE
Nitrite: NEGATIVE
Specific Gravity, Urine: 1.032 — ABNORMAL HIGH (ref 1.005–1.030)
pH: 7 (ref 5.0–8.0)

## 2022-07-06 LAB — PREGNANCY, URINE: Preg Test, Ur: NEGATIVE

## 2022-07-06 MED ORDER — METHOCARBAMOL 500 MG PO TABS: 500.0000 mg | ORAL_TABLET | Freq: Three times a day (TID) | ORAL | 0 refills | Status: DC | PRN

## 2022-07-06 MED ORDER — OXYCODONE HCL 5 MG PO TABS
5.0000 mg | ORAL_TABLET | Freq: Once | ORAL | Status: AC
  Administered 2022-07-06: 5 mg via ORAL
  Filled 2022-07-06: qty 1

## 2022-07-06 MED ORDER — KETOROLAC TROMETHAMINE 15 MG/ML IJ SOLN
30.0000 mg | Freq: Once | INTRAMUSCULAR | Status: DC
  Filled 2022-07-06: qty 2

## 2022-07-06 MED ORDER — IBUPROFEN 800 MG PO TABS
800.0000 mg | ORAL_TABLET | Freq: Once | ORAL | Status: AC
  Administered 2022-07-06: 800 mg via ORAL
  Filled 2022-07-06: qty 1

## 2022-07-06 MED ORDER — NAPROXEN 500 MG PO TABS: 500.0000 mg | ORAL_TABLET | Freq: Two times a day (BID) | ORAL | 0 refills | Status: DC

## 2022-07-06 MED ORDER — LIDOCAINE 5 % EX PTCH: 1.0000 | MEDICATED_PATCH | CUTANEOUS | 0 refills | Status: DC
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
## 2022-07-06 NOTE — ED Notes (Signed)
Toradol was pulled up into syringe for injections when Pt refused the shot and asked for something oral instead. Toradol was wasted in med room in the appropriate waste container.

## 2022-07-06 NOTE — Discharge Instructions (Signed)
You were evaluated in the Emergency Department and after careful evaluation, we did not find any emergent condition requiring admission or further testing in the hospital.  Your exam/testing today is overall reassuring.  Symptoms likely due to a muscle strain or spasm in the lower back.  Recommend using the Naprosyn twice daily as prescribed for pain.  Can use the Robaxin muscle relaxer for more significant pain, best used at night if you are having trouble sleeping as it can cause drowsiness.  Can also use the numbing patches as needed daily.  Please return to the Emergency Department if you experience any worsening of your condition.   Thank you for allowing [place] to be a part of your care.

## 2022-07-06 NOTE — ED Notes (Signed)
Patient transported to X-ray 

## 2022-10-19 ENCOUNTER — Ambulatory Visit (INDEPENDENT_AMBULATORY_CARE_PROVIDER_SITE_OTHER): Payer: BC Managed Care – PPO

## 2022-10-19 ENCOUNTER — Other Ambulatory Visit (HOSPITAL_COMMUNITY)
Admission: RE | Admit: 2022-10-19 | Discharge: 2022-10-19 | Disposition: A | Discharge status: Home / Self Care | Payer: BC Managed Care – PPO | Source: Ambulatory Visit | Attending: Obstetrics and Gynecology | Admitting: Obstetrics and Gynecology

## 2022-10-19 DIAGNOSIS — N898 Other specified noninflammatory disorders of vagina: Secondary | ICD-10-CM | POA: Insufficient documentation

## 2022-10-19 NOTE — Progress Notes (Signed)
..  SUBJECTIVE:  37 y.o. female complains of  vaginal discharge and irritation for a few day(s). Denies abnormal vaginal bleeding or significant pelvic pain or fever. No UTI symptoms. Denies history of known exposure to STD.  No LMP recorded.  OBJECTIVE:  She appears well, afebrile. Urine dipstick: not done.  ASSESSMENT:  Vaginal Discharge  Vaginal Odor   PLAN:  GC, chlamydia, trichomonas, BVAG, CVAG probe sent to lab. Treatment: To be determined once lab results are received ROV prn if symptoms persist or worsen.

## 2022-10-20 LAB — CERVICOVAGINAL ANCILLARY ONLY
Bacterial Vaginitis (gardnerella): POSITIVE — AB
Candida Glabrata: NEGATIVE
Candida Vaginitis: NEGATIVE
Chlamydia: NEGATIVE
Comment: NEGATIVE
Comment: NEGATIVE
Comment: NEGATIVE
Comment: NEGATIVE
Comment: NEGATIVE
Comment: NORMAL
Neisseria Gonorrhea: NEGATIVE
Trichomonas: NEGATIVE

## 2022-10-21 ENCOUNTER — Other Ambulatory Visit: Payer: Self-pay | Admitting: Family Medicine

## 2022-10-21 MED ORDER — METRONIDAZOLE 500 MG PO TABS: 500.0000 mg | ORAL_TABLET | Freq: Two times a day (BID) | ORAL | 0 refills | Status: DC

## 2022-11-20 ENCOUNTER — Ambulatory Visit: Payer: BC Managed Care – PPO

## 2022-11-20 ENCOUNTER — Other Ambulatory Visit: Payer: Self-pay

## 2022-11-20 DIAGNOSIS — N898 Other specified noninflammatory disorders of vagina: Secondary | ICD-10-CM

## 2022-11-20 MED ORDER — METRONIDAZOLE 500 MG PO TABS: 500.0000 mg | ORAL_TABLET | Freq: Two times a day (BID) | ORAL | 0 refills | Status: AC

## 2023-03-15 ENCOUNTER — Ambulatory Visit
Admission: EM | Admit: 2023-03-15 | Discharge: 2023-03-15 | Disposition: A | Discharge status: Home / Self Care | Payer: BC Managed Care – PPO | Attending: Family Medicine | Admitting: Family Medicine

## 2023-03-15 DIAGNOSIS — M20011 Mallet finger of right finger(s): Secondary | ICD-10-CM

## 2023-03-15 NOTE — ED Provider Notes (Signed)
[name]    CSN: [id number] Arrival date & time: 03/15/23  1025      History   Chief Complaint Chief Complaint  Patient presents with   Finger Injury    HPI Kristi [name] is a 38 y.o. female.   The history is provided by the patient.  Injury to right index finger, states she was cleaning a mattress all of a sudden heard a pop now she is unable to extend at her PIPJ.  Admits mild tenderness.  Denies history of similar symptoms in the past.  She is right-handed.  Denies other injury.  Past Medical History:  Diagnosis Date   Pap smear abnormality of cervix     Patient Active Problem List   Diagnosis Date Noted   Umbilical hernia without obstruction and without gangrene 11/16/2020   Retained products of conception, following delivery with hemorrhage 07/20/2017    Past Surgical History:  Procedure Laterality Date   COLPOSCOPY  2013-2014   DILATION AND EVACUATION N/A 07/20/2017   Procedure: DILATATION AND EVACUATION;  Surgeon: Tereso Newcomer, MD;  Location: [facility];  Service: Gynecology;  Laterality: N/A;   SIGMOIDOSCOPY      OB History     Gravida  2   Para  2   Term  2   Preterm      AB      Living  2      SAB      IAB      Ectopic      Multiple  0   Live Births  2        Obstetric Comments  D&E on 07/20/17 (8 weeks PP) for RPOCs          Home Medications    Prior to Admission medications   Medication Sig Start Date End Date Taking? Authorizing Provider  AMBULATORY NON FORMULARY MEDICATION Medication Name: Diltiazem 2%/Lidocaine 2%   Using your index finger apply a small amount of medication inside the anal opening and to the external anal area three times daily x 4 weeks. Patient not taking: Reported on 10/19/2022 01/19/21   Unk Lightning, PA  fluconazole (DIFLUCAN) 200 MG tablet TAKE 1 TABLET (200 MG TOTAL) BY MOUTH EVERY 3 (THREE) DAYS. Patient not taking: Reported on 10/19/2022 06/21/21   [name], MD   lidocaine (LIDODERM) 5 % Place 1 patch onto the skin daily. Remove & Discard patch within 12 hours or as directed by MD Patient not taking: Reported on 10/19/2022 07/06/22   Sabas Sous, MD  methocarbamol (ROBAXIN) 500 MG tablet Take 1 tablet (500 mg total) by mouth every 8 (eight) hours as needed for muscle spasms. Patient not taking: Reported on 10/19/2022 07/06/22   Sabas Sous, MD  naproxen (NAPROSYN) 500 MG tablet Take 1 tablet (500 mg total) by mouth 2 (two) times daily. Patient not taking: Reported on 10/19/2022 07/06/22   Sabas Sous, MD  Prenatal Vit-Fe Fumarate-FA (PRENATAL PO) Take 1 tablet by mouth daily. Patient not taking: Reported on 10/19/2022    [provider]    Family History Family History  Problem Relation Age of Onset   Hypertension Mother    Stroke Mother        tia   Hypertension Father    Colon cancer Maternal Grandfather    Ovarian cancer Paternal Grandmother    Diabetes Paternal Grandmother     Social History Social History   Tobacco Use   Smoking status:  Never   Smokeless tobacco: Never  Vaping Use   Vaping status: Never Used  Substance Use Topics   Alcohol use: Yes    Comment: social   Drug use: No     Allergies   Adhesive [tape]   Review of Systems Review of Systems   Physical Exam Triage Vital Signs ED Triage Vitals  Encounter Vitals Group     BP 03/15/23 1032 116/75     Systolic BP Percentile --      Diastolic BP Percentile --      Pulse Rate 03/15/23 1032 71     Resp 03/15/23 1032 16     Temp 03/15/23 1032 98.5 F (36.9 C)     Temp Source 03/15/23 1032 Oral     SpO2 03/15/23 1032 96 %     Weight --      Height --      Head Circumference --      Peak Flow --      Pain Score 03/15/23 1033 2     Pain Loc --      Pain Education --      Exclude from Growth Chart --    No data found.  Updated Vital Signs BP 116/75 (BP Location: Right Arm)   Pulse 71   Temp 98.5 F (36.9 C) (Oral)   Resp 16   LMP  03/06/2023 (Exact Date)   SpO2 96%   Breastfeeding No   Visual Acuity Right Eye Distance:   Left Eye Distance:   Bilateral Distance:    Right Eye Near:   Left Eye Near:    Bilateral Near:     Physical Exam Vitals and nursing note reviewed.  Constitutional:      Appearance: Normal appearance.  Pulmonary:     Effort: Pulmonary effort is normal.  Musculoskeletal:        General: Deformity and signs of injury present.     Comments: Right index finger is flexed at the PIPJ patient is unable to extend the finger, has local tenderness.  Rest of right hand exam is normal  Skin:    General: Skin is warm and dry.     Findings: No bruising.  Neurological:     Mental Status: She is alert and oriented to person, place, and time.  Psychiatric:        Mood and Affect: Mood normal.      UC Treatments / Results  Labs (all labs ordered are listed, but only abnormal results are displayed) Labs Reviewed - No data to display  EKG   Radiology No results found.  Procedures Procedures (including critical care time)  Medications Ordered in UC Medications - No data to display  Initial Impression / Assessment and Plan / UC Course  I have reviewed the triage vital signs and the nursing notes.  Pertinent labs & imaging results that were available during my care of the patient were reviewed by me and considered in my medical decision making (see chart for details).    Discussed with patient clinically she has a mallet finger, we do not have x-ray capabilities in our office today.  She was given the option to obtain an outpatient x-ray and follow-up with/hand/Ortho or go directly to [facility] walk-in clinic.  Right index finger PIPJ was splinted in extension.  Patient was instructed to keep the splint on until she sees the specialist Final Clinical Impressions(s) / UC Diagnoses   Final diagnoses:  Mallet finger of right hand  Discharge Instructions      Get the x-ray  today Wear the splint until you see the specialist   ED Prescriptions   None    PDMP not reviewed this encounter.   Meliton Rattan, [place] 03/15/23 1133

## 2023-03-15 NOTE — ED Triage Notes (Signed)
Pt states she jammed her right 2nd digit yesterday.  Deformity noted to finger.

## 2023-03-15 NOTE — ED Notes (Signed)
Marylene Land PA discharged patient at this time. Reviewed discharge order for outpatient x-ray. Pt verbalized understanding.

## 2023-03-15 NOTE — Discharge Instructions (Signed)
Get the x-ray today Wear the splint until you see the specialist

## 2023-03-19 ENCOUNTER — Ambulatory Visit: Payer: BC Managed Care – PPO | Admitting: Radiology

## 2023-03-27 DIAGNOSIS — M20011 Mallet finger of right finger(s): Secondary | ICD-10-CM | POA: Diagnosis not present

## 2023-04-05 DIAGNOSIS — M20011 Mallet finger of right finger(s): Secondary | ICD-10-CM | POA: Diagnosis not present

## 2023-04-11 DIAGNOSIS — M20011 Mallet finger of right finger(s): Secondary | ICD-10-CM | POA: Diagnosis not present

## 2023-04-26 DIAGNOSIS — M20011 Mallet finger of right finger(s): Secondary | ICD-10-CM | POA: Diagnosis not present

## 2023-05-08 DIAGNOSIS — M20011 Mallet finger of right finger(s): Secondary | ICD-10-CM | POA: Diagnosis not present

## 2023-05-08 DIAGNOSIS — M25531 Pain in right wrist: Secondary | ICD-10-CM | POA: Diagnosis not present

## 2023-06-04 ENCOUNTER — Ambulatory Visit: Admitting: *Deleted

## 2023-06-04 VITALS — BP 105/69 | HR 51

## 2023-06-04 DIAGNOSIS — Z3201 Encounter for pregnancy test, result positive: Secondary | ICD-10-CM

## 2023-06-04 DIAGNOSIS — Z3200 Encounter for pregnancy test, result unknown: Secondary | ICD-10-CM

## 2023-06-04 LAB — POCT URINE PREGNANCY: Preg Test, Ur: POSITIVE — AB

## 2023-06-04 NOTE — Progress Notes (Signed)
 [name] presents today for UPT. She has no unusual complaints.  LMP: 04/30/23    OBJECTIVE: Appears well, in no apparent distress.  OB History     Gravida  2   Para  2   Term  2   Preterm      AB      Living  2      SAB      IAB      Ectopic      Multiple  0   Live Births  2        Obstetric Comments  D&E on 07/20/17 (8 weeks PP) for RPOCs        Home UPT Result: Positive In-Office UPT result: Positive I have reviewed the patient's medical, obstetrical, social, and family histories, and medications.   ASSESSMENT: Positive pregnancy test  PLAN Prenatal care to be completed at: [facility]  US / Intake scheduled at check out. SAB, ectopic, and extreme N/V precautions reviewed. Instructions on location of MAU given.

## 2023-06-05 DIAGNOSIS — M79644 Pain in right finger(s): Secondary | ICD-10-CM | POA: Diagnosis not present

## 2023-06-05 DIAGNOSIS — M654 Radial styloid tenosynovitis [de Quervain]: Secondary | ICD-10-CM | POA: Diagnosis not present

## 2023-06-05 DIAGNOSIS — M25531 Pain in right wrist: Secondary | ICD-10-CM | POA: Diagnosis not present

## 2023-06-05 DIAGNOSIS — M20011 Mallet finger of right finger(s): Secondary | ICD-10-CM | POA: Diagnosis not present

## 2023-06-14 DIAGNOSIS — M20011 Mallet finger of right finger(s): Secondary | ICD-10-CM | POA: Diagnosis not present

## 2023-06-20 DIAGNOSIS — M20011 Mallet finger of right finger(s): Secondary | ICD-10-CM | POA: Diagnosis not present

## 2023-06-26 ENCOUNTER — Encounter

## 2023-06-26 DIAGNOSIS — M20011 Mallet finger of right finger(s): Secondary | ICD-10-CM | POA: Diagnosis not present

## 2023-06-27 ENCOUNTER — Ambulatory Visit (INDEPENDENT_AMBULATORY_CARE_PROVIDER_SITE_OTHER): Admitting: *Deleted

## 2023-06-27 ENCOUNTER — Other Ambulatory Visit (INDEPENDENT_AMBULATORY_CARE_PROVIDER_SITE_OTHER): Payer: Self-pay

## 2023-06-27 VITALS — BP 112/68 | HR 77 | Wt 185.8 lb

## 2023-06-27 DIAGNOSIS — Z3480 Encounter for supervision of other normal pregnancy, unspecified trimester: Secondary | ICD-10-CM | POA: Insufficient documentation

## 2023-06-27 DIAGNOSIS — O219 Vomiting of pregnancy, unspecified: Secondary | ICD-10-CM

## 2023-06-27 DIAGNOSIS — O3680X0 Pregnancy with inconclusive fetal viability, not applicable or unspecified: Secondary | ICD-10-CM

## 2023-06-27 DIAGNOSIS — Z3A08 8 weeks gestation of pregnancy: Secondary | ICD-10-CM

## 2023-06-27 DIAGNOSIS — Z3481 Encounter for supervision of other normal pregnancy, first trimester: Secondary | ICD-10-CM

## 2023-06-27 DIAGNOSIS — Z1331 Encounter for screening for depression: Secondary | ICD-10-CM

## 2023-06-27 MED ORDER — PROMETHAZINE HCL 25 MG PO TABS: 25.0000 mg | ORAL_TABLET | Freq: Four times a day (QID) | ORAL | 1 refills | Status: DC | PRN

## 2023-06-27 NOTE — Progress Notes (Signed)
 New OB Intake  I connected with Terri Fester  on 06/27/23 at  2:10 PM EDT by In Person Visit and verified that I am speaking with the correct person using two identifiers. Nurse is located at [facility] and pt is located at [facility].  I discussed the limitations, risks, security and privacy concerns of performing an evaluation and management service by telephone and the availability of in person appointments. I also discussed with the patient that there may be a patient responsible charge related to this service. The patient expressed understanding and agreed to proceed.  I explained I am completing New OB Intake today. We discussed EDD of 02/04/2024, by Last Menstrual Period. Pt is G3P2002. I reviewed her allergies, medications and Medical/Surgical/OB history.    Patient Active Problem List   Diagnosis Date Noted   Supervision of other normal pregnancy, antepartum 06/27/2023   Umbilical hernia without obstruction and without gangrene 11/16/2020   Retained products of conception, following delivery with hemorrhage 07/20/2017     Concerns addressed today  Delivery Plans Plans to deliver at [facility] Discussed the nature of our practice with multiple providers including residents and students. Due to the size of the practice, the delivering provider may not be the same as those providing prenatal care.   Patient is interested in water birth.  MyChart/Babyscripts MyChart access verified. I explained pt will have some visits in office and some virtually. Babyscripts instructions given and order placed. Patient verifies receipt of registration text/e-mail. Account successfully created and app downloaded. If patient is a candidate for Optimized scheduling, add to sticky note.   Blood Pressure Cuff/Weight Scale Blood pressure cuff ordered for patient to pick-up from [employer]. Explained after first prenatal appt pt will check weekly and document in Babyscripts. Patient does not have weight  scale; patient may purchase if they desire to track weight weekly in Babyscripts.  Anatomy US  Explained first scheduled US  will be around 19 weeks. Anatomy US  scheduled for TBD at TBD.  Is patient a candidate for Babyscripts Optimization? No, due to Risk Factors   First visit review I reviewed new OB appt with patient. Explained pt will be seen by Dr. Dodie Frees at first visit. Discussed [facility] [place] genetic screening with patient. Requests Panorama. Routine prenatal labs OB Urine only collected at today's visit. Initial OB labs deferred to New OB appt.   Last Pap Diagnosis  Date Value Ref Range Status  09/22/2019   Final   - Negative for intraepithelial lesion or malignancy (NILM)    Donette Furlong, RN 06/27/2023  3:02 PM

## 2023-06-27 NOTE — Patient Instructions (Signed)

## 2023-06-27 NOTE — Addendum Note (Signed)
 Addended by: Keelyn Fjelstad M on: 06/27/2023 04:33 PM   Modules accepted: Orders

## 2023-06-29 LAB — CULTURE, OB URINE

## 2023-06-29 LAB — URINE CULTURE, OB REFLEX: Organism ID, Bacteria: NO GROWTH

## 2023-07-02 ENCOUNTER — Ambulatory Visit: Payer: Self-pay | Admitting: Obstetrics and Gynecology

## 2023-07-04 DIAGNOSIS — M20011 Mallet finger of right finger(s): Secondary | ICD-10-CM | POA: Diagnosis not present

## 2023-07-23 ENCOUNTER — Encounter: Admitting: Obstetrics and Gynecology

## 2023-07-23 DIAGNOSIS — Z3480 Encounter for supervision of other normal pregnancy, unspecified trimester: Secondary | ICD-10-CM

## 2023-07-23 DIAGNOSIS — Z3A12 12 weeks gestation of pregnancy: Secondary | ICD-10-CM

## 2023-07-25 ENCOUNTER — Encounter: Admitting: Family Medicine

## 2023-07-31 ENCOUNTER — Ambulatory Visit: Admitting: Obstetrics and Gynecology

## 2023-07-31 ENCOUNTER — Other Ambulatory Visit (HOSPITAL_COMMUNITY)
Admission: RE | Admit: 2023-07-31 | Discharge: 2023-07-31 | Disposition: A | Discharge status: Home / Self Care | Source: Ambulatory Visit | Attending: Obstetrics and Gynecology | Admitting: Obstetrics and Gynecology

## 2023-07-31 ENCOUNTER — Encounter: Payer: Self-pay | Admitting: Obstetrics and Gynecology

## 2023-07-31 VITALS — BP 103/64 | HR 77 | Wt 183.0 lb

## 2023-07-31 DIAGNOSIS — O09291 Supervision of pregnancy with other poor reproductive or obstetric history, first trimester: Secondary | ICD-10-CM

## 2023-07-31 DIAGNOSIS — Z3480 Encounter for supervision of other normal pregnancy, unspecified trimester: Secondary | ICD-10-CM | POA: Insufficient documentation

## 2023-07-31 DIAGNOSIS — O09521 Supervision of elderly multigravida, first trimester: Secondary | ICD-10-CM

## 2023-07-31 DIAGNOSIS — Z3A13 13 weeks gestation of pregnancy: Secondary | ICD-10-CM | POA: Insufficient documentation

## 2023-07-31 DIAGNOSIS — Z6791 Unspecified blood type, Rh negative: Secondary | ICD-10-CM | POA: Diagnosis not present

## 2023-07-31 DIAGNOSIS — Z01419 Encounter for gynecological examination (general) (routine) without abnormal findings: Secondary | ICD-10-CM | POA: Diagnosis not present

## 2023-07-31 DIAGNOSIS — O26891 Other specified pregnancy related conditions, first trimester: Secondary | ICD-10-CM | POA: Diagnosis not present

## 2023-07-31 DIAGNOSIS — O09299 Supervision of pregnancy with other poor reproductive or obstetric history, unspecified trimester: Secondary | ICD-10-CM | POA: Insufficient documentation

## 2023-07-31 DIAGNOSIS — Z1151 Encounter for screening for human papillomavirus (HPV): Secondary | ICD-10-CM | POA: Diagnosis not present

## 2023-07-31 DIAGNOSIS — Z124 Encounter for screening for malignant neoplasm of cervix: Secondary | ICD-10-CM | POA: Diagnosis not present

## 2023-07-31 DIAGNOSIS — O09529 Supervision of elderly multigravida, unspecified trimester: Secondary | ICD-10-CM | POA: Insufficient documentation

## 2023-07-31 DIAGNOSIS — Z113 Encounter for screening for infections with a predominantly sexual mode of transmission: Secondary | ICD-10-CM | POA: Insufficient documentation

## 2023-07-31 MED ORDER — ASPIRIN 81 MG PO CHEW: 81.0000 mg | CHEWABLE_TABLET | Freq: Every day | ORAL | 3 refills | Status: DC

## 2023-07-31 NOTE — Progress Notes (Signed)
 Pt states she has abd hernia and also had diastasis recti muscle tear last pregnancy.

## 2023-07-31 NOTE — Progress Notes (Signed)
 INITIAL PRENATAL VISIT NOTE  Subjective:  Kristi [name] is a 38 y.o. G3P2002 at [redacted]w[redacted]d by LMP being seen today for her initial prenatal visit.  She has an obstetric history significant for SVD x 2. She has a medical history significant for rh negative and rectus diastasis.  Patient reports no complaints.  Contractions: Not present. Vag. Bleeding: None.   . Denies leaking of fluid.    Past Medical History:  Diagnosis Date   Pap smear abnormality of cervix     Past Surgical History:  Procedure Laterality Date   COLPOSCOPY  2013-2014   DILATION AND EVACUATION N/A 07/20/2017   Procedure: DILATATION AND EVACUATION;  Surgeon: Herchel Gloris LABOR, MD;  Location: [facility];  Service: Gynecology;  Laterality: N/A;   SIGMOIDOSCOPY      OB History  Gravida Para Term Preterm AB Living  3 2 2  0 0 2  SAB IAB Ectopic Multiple Live Births  0 0 0 0 2    # Outcome Date GA Lbr Len/2nd Weight Sex Type Anes PTL Lv  3 Current           2 Term 10/04/20 [redacted]w[redacted]d 03:46 / 01:01 8 lb 2.5 oz (3.7 kg) F Vag-Spont EPI  LIV  1 Term 05/25/17 [redacted]w[redacted]d 19:05 / 03:45 7 lb 13.6 oz (3.56 kg) M Vag-Spont EPI  LIV     Birth Comments: WNL     Complications: Retained products of conception, following delivery with hemorrhage    Obstetric Comments  D&E on 07/20/17 (8 weeks PP) for RPOCs    Social History   Socioeconomic History   Marital status: Single    Spouse name: Not on file   Number of children: 2   Years of education: Not on file   Highest education level: Not on file  Occupational History   Occupation: Nurse  Tobacco Use   Smoking status: Never   Smokeless tobacco: Never  Vaping Use   Vaping status: Never Used  Substance and Sexual Activity   Alcohol use: Not Currently    Comment: social   Drug use: No   Sexual activity: Yes    Partners: Male    Birth control/protection: None    Comment: Pregnant   Other Topics Concern   Not on file  Social History Narrative   Not on file   Social Drivers of  Health   Financial Resource Strain: Not on file  Food Insecurity: Not on file  Transportation Needs: Not on file  Physical Activity: Not on file  Stress: Not on file  Social Connections: Not on file    Family History  Problem Relation Age of Onset   Hypertension Mother    Stroke Mother        tia   Cancer - Colon Mother        Stage 0 with colon resection   Hypertension Father    Colon cancer Maternal Grandfather    Ovarian cancer Paternal Grandmother    Diabetes Paternal Grandmother      Current Outpatient Medications:    aspirin 81 MG chewable tablet, Chew 1 tablet (81 mg total) by mouth daily., Disp: 90 tablet, Rfl: 3   Prenatal Vit-Fe Fumarate-FA (PRENATAL PO), Take 1 tablet by mouth daily., Disp: , Rfl:    AMBULATORY NON FORMULARY MEDICATION, Medication Name: Diltiazem 2%/Lidocaine  2%   Using your index finger apply a small amount of medication inside the anal opening and to the external anal area three times daily x 4 weeks. (  Patient not taking: Reported on 10/19/2022), Disp: 30 g, Rfl: 1   promethazine  (PHENERGAN ) 25 MG tablet, Take 1 tablet (25 mg total) by mouth every 6 (six) hours as needed for nausea or vomiting., Disp: 30 tablet, Rfl: 1  Allergies  Allergen Reactions   Adhesive [Tape] Rash    Review of Systems: Negative except for what is mentioned in HPI.  Objective:   Vitals:   07/31/23 1014  BP: 103/64  Pulse: 77  Weight: 183 lb (83 kg)    Fetal Status: Fetal Heart Rate (bpm): 155         Physical Exam: BP 103/64   Pulse 77   Wt 183 lb (83 kg)   LMP 04/30/2023 (Exact Date)   BMI 29.54 kg/m  CONSTITUTIONAL: Well-developed, well-nourished female in no acute distress.  NEUROLOGIC: Alert and oriented to person, place, and time. Normal reflexes, muscle tone coordination. No cranial nerve deficit noted. PSYCHIATRIC: Normal mood and affect. Normal behavior. Normal judgment and thought content. SKIN: Skin is warm and dry.  HENT:  Normocephalic,  atraumatic, External right and left ear normal. Oropharynx is clear and moist EYES: Conjunctivae and EOM are normal.  NECK: Normal range of motion, supple, no masses CARDIOVASCULAR: Normal heart rate noted, regular rhythm RESPIRATORY: Effort and breath sounds normal, no problems with respiration noted BREASTS: deferred ABDOMEN: Soft, nontender, nondistended, gravid. Small umbilical hernia noted, rectus diastasis noted GU: normal appearing external female genitalia, multiparous normal appearing cervix, scant white discharge in vagina, no lesions noted, pap taken Bimanual: 13 weeks sized uterus, no adnexal tenderness or palpable lesions noted MUSCULOSKELETAL: Normal range of motion. EXT:  No edema and no tenderness. 2+ distal pulses.   Assessment and Plan:  Pregnancy: G3P2002 at [redacted]w[redacted]d by LMP  1. Supervision of other normal pregnancy, antepartum (Primary) Continue routine prenatal care  - Cytology - PAP( [place]) - Cervicovaginal ancillary only( [place]) - CBC/D/Plt+RPR+Rh+ABO+RubIgG... - HgB A1c - PANORAMA PRENATAL TEST - aspirin 81 MG chewable tablet; Chew 1 tablet (81 mg total) by mouth daily.  Dispense: 90 tablet; Refill: 3  2. [redacted] weeks gestation of pregnancy  - Cytology - PAP( [place]) - Cervicovaginal ancillary only( ) - CBC/D/Plt+RPR+Rh+ABO+RubIgG... - HgB A1c - PANORAMA PRENATAL TEST  3. Rh negative state in antepartum period Rhogam per guidelines  4. Multigravida of advanced maternal age in first trimester  - aspirin 81 MG chewable tablet; Chew 1 tablet (81 mg total) by mouth daily.  Dispense: 90 tablet; Refill: 3  5. History of postpartum hemorrhage, currently pregnant Post partum hemorrhage 2/2 retained placenta, was addressed several weeks later   Preterm labor symptoms and general obstetric precautions including but not limited to vaginal bleeding, contractions, leaking of fluid and fetal movement were reviewed in detail with the  patient.  Please refer to After Visit Summary for other counseling recommendations.   Return in about 4 weeks (around 08/28/2023) for ROB, in person.  Jerilynn DELENA Buddle 07/31/2023 1:17 PM

## 2023-08-01 ENCOUNTER — Ambulatory Visit: Payer: Self-pay | Admitting: Obstetrics and Gynecology

## 2023-08-01 LAB — CERVICOVAGINAL ANCILLARY ONLY
Chlamydia: NEGATIVE
Comment: NEGATIVE
Comment: NEGATIVE
Comment: NORMAL
Neisseria Gonorrhea: NEGATIVE
Trichomonas: NEGATIVE

## 2023-08-02 LAB — CYTOLOGY - PAP
Comment: NEGATIVE
Diagnosis: NEGATIVE
High risk HPV: NEGATIVE

## 2023-08-03 LAB — CBC/D/PLT+RPR+RH+ABO+RUBIGG...
Basophils Absolute: 0 x10E3/uL (ref 0.0–0.2)
Basos: 1 %
EOS (ABSOLUTE): 0.3 x10E3/uL (ref 0.0–0.4)
Eos: 5 %
HCV Ab: NONREACTIVE
HIV Screen 4th Generation wRfx: NONREACTIVE
Hematocrit: 35.6 % (ref 34.0–46.6)
Hemoglobin: 12 g/dL (ref 11.1–15.9)
Hepatitis B Surface Ag: NEGATIVE
Immature Grans (Abs): 0.1 x10E3/uL (ref 0.0–0.1)
Immature Granulocytes: 2 %
Lymphocytes Absolute: 1.3 x10E3/uL (ref 0.7–3.1)
Lymphs: 20 %
MCH: 31.3 pg (ref 26.6–33.0)
MCHC: 33.7 g/dL (ref 31.5–35.7)
MCV: 93 fL (ref 79–97)
Monocytes Absolute: 0.4 x10E3/uL (ref 0.1–0.9)
Monocytes: 6 %
Neutrophils Absolute: 4.3 x10E3/uL (ref 1.4–7.0)
Neutrophils: 66 %
Platelets: 197 x10E3/uL (ref 150–450)
RBC: 3.84 x10E6/uL (ref 3.77–5.28)
RDW: 12.7 % (ref 11.7–15.4)
RPR Ser Ql: NONREACTIVE
Rh Factor: NEGATIVE
Rubella Antibodies, IGG: 2.51 {index} (ref >0.99)
WBC: 6.4 x10E3/uL (ref 3.4–10.8)

## 2023-08-03 LAB — HEMOGLOBIN A1C
Est. average glucose Bld gHb Est-mCnc: 100 mg/dL
Hgb A1c MFr Bld: 5.1 % (ref 4.8–5.6)

## 2023-08-03 LAB — AB SCR+ANTIBODY ID: Antibody Screen: POSITIVE — AB

## 2023-08-03 LAB — HCV INTERPRETATION

## 2023-08-05 LAB — PANORAMA PRENATAL TEST FULL PANEL:PANORAMA TEST PLUS 5 ADDITIONAL MICRODELETIONS: FETAL FRACTION: 11

## 2023-09-05 ENCOUNTER — Ambulatory Visit: Admitting: Obstetrics & Gynecology

## 2023-09-05 ENCOUNTER — Encounter: Payer: Self-pay | Admitting: Obstetrics & Gynecology

## 2023-09-05 VITALS — BP 108/70 | HR 88 | Wt 189.0 lb

## 2023-09-05 DIAGNOSIS — O09522 Supervision of elderly multigravida, second trimester: Secondary | ICD-10-CM

## 2023-09-05 DIAGNOSIS — Z3A18 18 weeks gestation of pregnancy: Secondary | ICD-10-CM

## 2023-09-05 DIAGNOSIS — Z3480 Encounter for supervision of other normal pregnancy, unspecified trimester: Secondary | ICD-10-CM

## 2023-09-05 NOTE — Progress Notes (Signed)
 Pt presents for ROB. Pt was exposed to shingles 1-2 weeks ago. Pt would like to discuss when to start aspirin .

## 2023-09-05 NOTE — Progress Notes (Signed)
   PRENATAL VISIT NOTE  Subjective:  Kristi [name] is a 38 y.o. G3P2002 at [redacted]w[redacted]d being seen today for ongoing prenatal care.  She is currently monitored for the following issues for this high-risk pregnancy and has Rh negative state in antepartum period; Retained products of conception, following delivery with hemorrhage; Umbilical hernia without obstruction and without gangrene; Supervision of other normal pregnancy, antepartum; Advanced maternal age in multigravida; and History of postpartum hemorrhage, currently pregnant on their problem list.  Patient reports pelvic pressure and pin.  Contractions: Not present. Vag. Bleeding: None.  Movement: Absent. Denies leaking of fluid.   The following portions of the patient's history were reviewed and updated as appropriate: allergies, current medications, past family history, past medical history, past social history, past surgical history and problem list.   Objective:    Vitals:   09/05/23 1619  BP: 108/70  Pulse: 88  Weight: 189 lb (85.7 kg)    Fetal Status:  Fetal Heart Rate (bpm): 143   Movement: Absent    General: Alert, oriented and cooperative. Patient is in no acute distress.  Skin: Skin is warm and dry. No rash noted.   Cardiovascular: Normal heart rate noted  Respiratory: Normal respiratory effort, no problems with respiration noted  Abdomen: Soft, gravid, appropriate for gestational age.  Pain/Pressure: Present (pelvis)     Pelvic: Cervical exam deferred        Extremities: Normal range of motion.  Edema: None  Mental Status: Normal mood and affect. Normal behavior. Normal judgment and thought content.   Assessment and Plan:  Pregnancy: G3P2002 at [redacted]w[redacted]d 1. Supervision of other normal pregnancy, antepartum (Primary) Needs to be drawn - AFP, Serum, Open Spina Bifida  2. [redacted] weeks gestation of pregnancy  - AFP, Serum, Open Spina Bifida  3. Multigravida of advanced maternal age in second trimester   Preterm labor  symptoms and general obstetric precautions including but not limited to vaginal bleeding, contractions, leaking of fluid and fetal movement were reviewed in detail with the patient. Please refer to After Visit Summary for other counseling recommendations.   Return in about 4 weeks (around 10/03/2023).  Future Appointments  Date Time Provider Department Center  10/03/2023  1:10 PM [facility], Olam LABOR, CNM [facility] None    Lynwood Solomons, MD

## 2023-09-06 ENCOUNTER — Other Ambulatory Visit

## 2023-09-06 DIAGNOSIS — Z3A18 18 weeks gestation of pregnancy: Secondary | ICD-10-CM | POA: Diagnosis not present

## 2023-09-08 LAB — AFP, SERUM, OPEN SPINA BIFIDA
AFP MoM: 2.32
AFP Value: 96.9 ng/mL
Gest. Age on Collection Date: 18.1 wk
Maternal Age At EDD: 37 a
OSBR Risk 1 IN: 819
Test Results:: NEGATIVE
Weight: 189 [lb_av]

## 2023-09-10 ENCOUNTER — Encounter: Admitting: Obstetrics and Gynecology

## 2023-10-03 ENCOUNTER — Encounter: Payer: Self-pay | Admitting: Advanced Practice Midwife

## 2023-10-03 ENCOUNTER — Ambulatory Visit (INDEPENDENT_AMBULATORY_CARE_PROVIDER_SITE_OTHER): Admitting: Advanced Practice Midwife

## 2023-10-03 VITALS — BP 95/57 | HR 85 | Wt 197.2 lb

## 2023-10-03 DIAGNOSIS — Z6791 Unspecified blood type, Rh negative: Secondary | ICD-10-CM

## 2023-10-03 DIAGNOSIS — O26899 Other specified pregnancy related conditions, unspecified trimester: Secondary | ICD-10-CM

## 2023-10-03 DIAGNOSIS — Z3480 Encounter for supervision of other normal pregnancy, unspecified trimester: Secondary | ICD-10-CM

## 2023-10-03 DIAGNOSIS — Z3A22 22 weeks gestation of pregnancy: Secondary | ICD-10-CM

## 2023-10-03 NOTE — Progress Notes (Unsigned)
 Pt presents for ROB visit. Pt c/o congestion, taking sudafed and benadryl 

## 2023-10-03 NOTE — Progress Notes (Cosign Needed)
   PRENATAL VISIT NOTE  Subjective:  Kristi [name] is a 38 y.o. G3P2002 at [redacted]w[redacted]d being seen today for ongoing prenatal care.  She is currently monitored for the following issues for this low-risk pregnancy and has Rh negative state in antepartum period; Retained products of conception, following delivery with hemorrhage; Umbilical hernia without obstruction and without gangrene; Supervision of other normal pregnancy, antepartum; Advanced maternal age in multigravida; and History of postpartum hemorrhage, currently pregnant on their problem list.  Patient reports no complaints.  Contractions: Not present. Vag. Bleeding: None.  Movement: Present. Denies leaking of fluid.   The following portions of the patient's history were reviewed and updated as appropriate: allergies, current medications, past family history, past medical history, past social history, past surgical history and problem list.   Objective:    Vitals:   10/03/23 1324  BP: (!) 95/57  Pulse: 85  Weight: 89.4 kg    Fetal Status:  Fetal Heart Rate (bpm): 150   Movement: Present    General: Alert, oriented and cooperative. Patient is in no acute distress.  Skin: Skin is warm and dry. No rash noted.   Cardiovascular: Normal heart rate noted  Respiratory: Normal respiratory effort, no problems with respiration noted  Abdomen: Soft, gravid, appropriate for gestational age.  Pain/Pressure: Absent     Pelvic: Cervical exam deferred        Extremities: Normal range of motion.  Edema: None  Mental Status: Normal mood and affect. Normal behavior. Normal judgment and thought content.   Assessment and Plan:  Pregnancy: G3P2002 at [redacted]w[redacted]d There are no diagnoses linked to this encounter. Preterm labor symptoms and general obstetric precautions including but not limited to vaginal bleeding, contractions, leaking of fluid and fetal movement were reviewed in detail with the patient. Please refer to After Visit Summary for other counseling  recommendations.    1. Rh negative state in antepartum period (Primary) -Will give Rhogam at 28 weeks  2. [redacted] weeks gestation of pregnancy -Feeling well overall today. Endorses good fetal movement. Denies vaginal bleeding, LOF, ctx. -Anatomy US  scheduled for 10/24/23 -Anticipatory guidance for next visits  3. Supervision of other normal pregnancy, antepartum     Future Appointments  Date Time Provider Department Center  10/24/2023  1:00 PM [facility] PROVIDER 1 [facility]  10/24/2023  1:30 PM [facility] US1 [facility]    Rolin Amel, S-WHNP   Midwife Attestation:  I personally saw and evaluated the patient, performing the key elements of the service. I developed and verified the management plan that is described in the resident's/student's note, and I agree with the content with my edits above. VSS, HRR&R, Resp unlabored, Legs neg.    Olam Boards, CNM 3:17 PM

## 2023-10-24 ENCOUNTER — Encounter: Payer: Self-pay | Admitting: Obstetrics

## 2023-10-24 ENCOUNTER — Ambulatory Visit: Attending: Obstetrics and Gynecology | Admitting: Obstetrics

## 2023-10-24 ENCOUNTER — Ambulatory Visit (HOSPITAL_BASED_OUTPATIENT_CLINIC_OR_DEPARTMENT_OTHER)

## 2023-10-24 ENCOUNTER — Other Ambulatory Visit: Payer: Self-pay | Admitting: *Deleted

## 2023-10-24 VITALS — BP 101/58 | HR 93

## 2023-10-24 DIAGNOSIS — O360120 Maternal care for anti-D [Rh] antibodies, second trimester, not applicable or unspecified: Secondary | ICD-10-CM | POA: Insufficient documentation

## 2023-10-24 DIAGNOSIS — O09522 Supervision of elderly multigravida, second trimester: Secondary | ICD-10-CM | POA: Diagnosis not present

## 2023-10-24 DIAGNOSIS — Z363 Encounter for antenatal screening for malformations: Secondary | ICD-10-CM | POA: Diagnosis not present

## 2023-10-24 DIAGNOSIS — Z3480 Encounter for supervision of other normal pregnancy, unspecified trimester: Secondary | ICD-10-CM

## 2023-10-24 DIAGNOSIS — Z3A25 25 weeks gestation of pregnancy: Secondary | ICD-10-CM | POA: Insufficient documentation

## 2023-10-24 NOTE — Progress Notes (Signed)
 MFM Consult Note  Kristi [name] is currently at 25 weeks and 2 days.  She was seen due to advanced maternal age (38 years old).  She denies any significant past medical history and denies any problems in her current pregnancy.    She had a cell free DNA test earlier in her pregnancy which indicated a low risk for trisomy 72, 23, and 13. A female fetus is predicted.   Sonographic findings Single intrauterine pregnancy at 25w 2d  Fetal cardiac activity:  Observed and appears normal. Presentation: Transverse, head to maternal right. The anatomic structures that were well seen appear normal without evidence of soft markers. Due to poor acoustic windows some structures remain suboptimally visualized. Fetal biometry shows the estimated fetal weight of 1 pound 13 ounces which measures at the 50 percentile.  Amniotic fluid: Within normal limits.  MVP: 7.71 cm. Placenta: Anterior. Adnexa: No abnormality visualized. Cervical length: 4.2 cm.  The views of the fetal anatomy were limited today due to the fetal position.  What was visualized today appeared within normal limits.  The patient was informed that anomalies may be missed due to technical limitations. If the fetus is in a suboptimal position or maternal habitus is increased, visualization of the fetus in the maternal uterus may be impaired.  Advanced maternal age The increased risk of fetal aneuploidy due to advanced maternal age was discussed.  Due to advanced maternal age, the patient was offered and declined an amniocentesis today for definitive diagnosis of fetal aneuploidy.  She is comfortable with the low risk indicated by her cell free DNA test.  A follow-up exam was scheduled in 4 weeks to complete the views of the fetal anatomy and to assess the fetal growth.    The patient stated that all of her questions were answered today.  A total of 30 minutes was spent counseling and coordinating the care for this patient.  Greater than 50%  of the time was spent in direct face-to-face contact.

## 2023-10-29 ENCOUNTER — Ambulatory Visit (INDEPENDENT_AMBULATORY_CARE_PROVIDER_SITE_OTHER): Payer: Self-pay | Admitting: Advanced Practice Midwife

## 2023-10-29 ENCOUNTER — Other Ambulatory Visit

## 2023-10-29 ENCOUNTER — Encounter: Payer: Self-pay | Admitting: Advanced Practice Midwife

## 2023-10-29 VITALS — BP 95/57 | HR 68 | Wt 199.6 lb

## 2023-10-29 DIAGNOSIS — Z3480 Encounter for supervision of other normal pregnancy, unspecified trimester: Secondary | ICD-10-CM | POA: Diagnosis not present

## 2023-10-29 DIAGNOSIS — Z3A26 26 weeks gestation of pregnancy: Secondary | ICD-10-CM

## 2023-10-29 NOTE — Progress Notes (Signed)
 Pt presents for ROB visit. No concerns

## 2023-10-29 NOTE — Progress Notes (Signed)
   PRENATAL VISIT NOTE  Subjective:  Kristi [name] is a 38 y.o. G3P2002 at [redacted]w[redacted]d being seen today for ongoing prenatal care.  She is currently monitored for the following issues for this low-risk pregnancy and has Rh negative state in antepartum period; Retained products of conception, following delivery with hemorrhage; Umbilical hernia without obstruction and without gangrene; Supervision of other normal pregnancy, antepartum; Advanced maternal age in multigravida; and History of postpartum hemorrhage, currently pregnant on their problem list.  Patient reports no complaints.  Contractions: Not present. Vag. Bleeding: None.  Movement: Present. Denies leaking of fluid.   The following portions of the patient's history were reviewed and updated as appropriate: allergies, current medications, past family history, past medical history, past social history, past surgical history and problem list.   Objective:    Vitals:   10/29/23 0947  BP: (!) 95/57  Pulse: 68  Weight: 199 lb 9.6 oz (90.5 kg)    Fetal Status:  Fetal Heart Rate (bpm): 145 Fundal Height: 27 cm Movement: Present    General: Alert, oriented and cooperative. Patient is in no acute distress.  Skin: Skin is warm and dry. No rash noted.   Cardiovascular: Normal heart rate noted  Respiratory: Normal respiratory effort, no problems with respiration noted  Abdomen: Soft, gravid, appropriate for gestational age.  Pain/Pressure: Absent     Pelvic: Cervical exam deferred        Extremities: Normal range of motion.  Edema: None  Mental Status: Normal mood and affect. Normal behavior. Normal judgment and thought content.   Assessment and Plan:  Pregnancy: G3P2002 at [redacted]w[redacted]d 1. Supervision of other normal pregnancy, antepartum (Primary) -Doing well today. BP and FHR appropriate. Feeling good fetal movement.   2. [redacted] weeks gestation of pregnancy -Anticipatory guidance for upcoming visit schedule following 28 weeks -Discussed Rhogam and  Tdap injection next visit - Glucose Tolerance, 2 Hours w/1 Hour - HIV antibody (with reflex) - RPR - CBC - Antibody screen  Preterm labor symptoms and general obstetric precautions including but not limited to vaginal bleeding, contractions, leaking of fluid and fetal movement were reviewed in detail with the patient. Please refer to After Visit Summary for other counseling recommendations.   Return in about 2 weeks (around 11/12/2023) for With me , LOB.  Future Appointments  Date Time Provider Department Center  11/13/2023  9:55 AM [facility], Olam LABOR, CNM [facility] None  11/22/2023  1:15 PM [facility] PROVIDER 1 [facility]  11/22/2023  1:30 PM [facility] US6 [facility]  11/27/2023  1:10 PM [facility], Olam LABOR, CNM [facility] None   Rolin Amel, student NP  Midwife Attestation:  I personally saw and evaluated the patient, performing the key elements of the service. I developed and verified the management plan that is described in the resident's/student's note, and I agree with the content with my edits above. VSS, HRR&R, Resp unlabored, Legs neg.    Olam Boards, CNM 6:51 PM

## 2023-10-30 LAB — GLUCOSE TOLERANCE, 2 HOURS W/ 1HR
Glucose, 1 hour: 87 mg/dL (ref 70–179)
Glucose, 2 hour: 90 mg/dL (ref 70–152)
Glucose, Fasting: 80 mg/dL (ref 70–91)

## 2023-10-30 LAB — CBC
Hematocrit: 33.1 % — ABNORMAL LOW (ref 34.0–46.6)
Hemoglobin: 11.2 g/dL (ref 11.1–15.9)
MCH: 31.9 pg (ref 26.6–33.0)
MCHC: 33.8 g/dL (ref 31.5–35.7)
MCV: 94 fL (ref 79–97)
Platelets: 168 x10E3/uL (ref 150–450)
RBC: 3.51 x10E6/uL — ABNORMAL LOW (ref 3.77–5.28)
RDW: 12.8 % (ref 11.7–15.4)
WBC: 7.6 x10E3/uL (ref 3.4–10.8)

## 2023-10-30 LAB — HIV ANTIBODY (ROUTINE TESTING W REFLEX): HIV Screen 4th Generation wRfx: NONREACTIVE

## 2023-10-30 LAB — RPR: RPR Ser Ql: NONREACTIVE

## 2023-11-13 ENCOUNTER — Ambulatory Visit (INDEPENDENT_AMBULATORY_CARE_PROVIDER_SITE_OTHER): Admitting: Advanced Practice Midwife

## 2023-11-13 ENCOUNTER — Encounter: Payer: Self-pay | Admitting: Advanced Practice Midwife

## 2023-11-13 VITALS — BP 103/57 | HR 72 | Wt 204.6 lb

## 2023-11-13 DIAGNOSIS — Z23 Encounter for immunization: Secondary | ICD-10-CM | POA: Diagnosis not present

## 2023-11-13 DIAGNOSIS — O99019 Anemia complicating pregnancy, unspecified trimester: Secondary | ICD-10-CM

## 2023-11-13 DIAGNOSIS — O26813 Pregnancy related exhaustion and fatigue, third trimester: Secondary | ICD-10-CM

## 2023-11-13 DIAGNOSIS — Z3A28 28 weeks gestation of pregnancy: Secondary | ICD-10-CM | POA: Diagnosis not present

## 2023-11-13 DIAGNOSIS — Z3480 Encounter for supervision of other normal pregnancy, unspecified trimester: Secondary | ICD-10-CM

## 2023-11-13 MED ORDER — RHO D IMMUNE GLOBULIN 1500 UNIT/2ML IJ SOSY
300.0000 ug | PREFILLED_SYRINGE | Freq: Once | INTRAMUSCULAR | Status: AC
  Administered 2023-11-13: 300 ug via INTRAMUSCULAR

## 2023-11-13 NOTE — Progress Notes (Signed)
   PRENATAL VISIT NOTE  Subjective:  Kristi [name] is a 38 y.o. G3P2002 at [redacted]w[redacted]d being seen today for ongoing prenatal care.  She is currently monitored for the following issues for this low-risk pregnancy and has Rh negative state in antepartum period; Retained products of conception, following delivery with hemorrhage; Umbilical hernia without obstruction and without gangrene; Supervision of other normal pregnancy, antepartum; Advanced maternal age in multigravida; and History of postpartum hemorrhage, currently pregnant on their problem list.  Patient reports fatigue.  Contractions: Irritability. Vag. Bleeding: None.  Movement: Present. Denies leaking of fluid.   The following portions of the patient's history were reviewed and updated as appropriate: allergies, current medications, past family history, past medical history, past social history, past surgical history and problem list.   Objective:    Vitals:   11/13/23 1026  BP: (!) 103/57  Pulse: 72  Weight: 204 lb 9.6 oz (92.8 kg)    Fetal Status:  Fetal Heart Rate (bpm): 144 Fundal Height: 28 cm Movement: Present    General: Alert, oriented and cooperative. Patient is in no acute distress.  Skin: Skin is warm and dry. No rash noted.   Cardiovascular: Normal heart rate noted  Respiratory: Normal respiratory effort, no problems with respiration noted  Abdomen: Soft, gravid, appropriate for gestational age.  Pain/Pressure: Absent     Pelvic: Cervical exam deferred        Extremities: Normal range of motion.  Edema: None  Mental Status: Normal mood and affect. Normal behavior. Normal judgment and thought content.   Assessment and Plan:  Pregnancy: G3P2002 at [redacted]w[redacted]d 1. Supervision of other normal pregnancy, antepartum (Primary) --Anticipatory guidance about next visits/weeks of pregnancy given.  --Reviewed safe sleep, pt has wedge pillow to sleep on back at 45 degrees elevated  2. Pregnancy related fatigue in third  trimester --Hgb 11.2 at 26 weeks --Pt with significant fatigue and intermittent SOB with activity, more than in prior pregnancies --Consider cardiology referral if no underlying anemia or thyroid disfunction  - Iron , TIBC and Ferritin Panel - TSH - CBC  3. [redacted] weeks gestation of pregnancy   Preterm labor symptoms and general obstetric precautions including but not limited to vaginal bleeding, contractions, leaking of fluid and fetal movement were reviewed in detail with the patient. Please refer to After Visit Summary for other counseling recommendations.   No follow-ups on file.  Future Appointments  Date Time Provider Department Center  11/22/2023  1:15 PM [facility] PROVIDER 1 [facility]  11/22/2023  1:30 PM [facility] US6 [facility]  11/27/2023  1:10 PM Left[facility]-Kirby, Olam LABOR, CNM [facility] None  12/11/2023  1:50 PM [facility], Olam LABOR, CNM [facility] None    Olam Boards, CNM

## 2023-11-13 NOTE — Progress Notes (Signed)
 Pt presents for ROB visit. Tdap and rhogam  given today

## 2023-11-14 DIAGNOSIS — H3589 Other specified retinal disorders: Secondary | ICD-10-CM | POA: Diagnosis not present

## 2023-11-15 LAB — CBC
Hematocrit: 30.8 % — ABNORMAL LOW (ref 34.0–46.6)
Hemoglobin: 10.2 g/dL — ABNORMAL LOW (ref 11.1–15.9)
MCH: 30.7 pg (ref 26.6–33.0)
MCHC: 33.1 g/dL (ref 31.5–35.7)
MCV: 93 fL (ref 79–97)
Platelets: 170 x10E3/uL (ref 150–450)
RBC: 3.32 x10E6/uL — ABNORMAL LOW (ref 3.77–5.28)
RDW: 13 % (ref 11.7–15.4)
WBC: 6.3 x10E3/uL (ref 3.4–10.8)

## 2023-11-15 LAB — IRON AND TIBC
Iron Saturation: 20 % (ref 15–55)
Iron: 96 ug/dL (ref 27–159)
Total Iron Binding Capacity: 487 ug/dL — ABNORMAL HIGH (ref 250–450)
UIBC: 391 ug/dL (ref 131–425)

## 2023-11-15 LAB — TSH: TSH: 1.63 u[IU]/mL (ref 0.450–4.500)

## 2023-11-16 LAB — SPECIMEN STATUS REPORT

## 2023-11-16 LAB — FERRITIN: Ferritin: 18 ng/mL (ref 15–150)

## 2023-11-20 ENCOUNTER — Ambulatory Visit: Payer: Self-pay | Admitting: Advanced Practice Midwife

## 2023-11-20 DIAGNOSIS — O99019 Anemia complicating pregnancy, unspecified trimester: Secondary | ICD-10-CM | POA: Insufficient documentation

## 2023-11-20 MED ORDER — ACCRUFER 30 MG PO CAPS: 1.0000 | ORAL_CAPSULE | Freq: Every day | ORAL | 3 refills | Status: DC

## 2023-11-22 ENCOUNTER — Ambulatory Visit: Attending: Obstetrics | Admitting: Maternal & Fetal Medicine

## 2023-11-22 ENCOUNTER — Ambulatory Visit (HOSPITAL_BASED_OUTPATIENT_CLINIC_OR_DEPARTMENT_OTHER)

## 2023-11-22 VITALS — BP 99/53

## 2023-11-22 DIAGNOSIS — O09523 Supervision of elderly multigravida, third trimester: Secondary | ICD-10-CM | POA: Insufficient documentation

## 2023-11-22 DIAGNOSIS — O360130 Maternal care for anti-D [Rh] antibodies, third trimester, not applicable or unspecified: Secondary | ICD-10-CM | POA: Diagnosis not present

## 2023-11-22 DIAGNOSIS — Z3480 Encounter for supervision of other normal pregnancy, unspecified trimester: Secondary | ICD-10-CM

## 2023-11-22 DIAGNOSIS — O09522 Supervision of elderly multigravida, second trimester: Secondary | ICD-10-CM

## 2023-11-22 DIAGNOSIS — Z3A29 29 weeks gestation of pregnancy: Secondary | ICD-10-CM | POA: Diagnosis not present

## 2023-11-22 NOTE — Progress Notes (Signed)
 After review, MFM consult with provider is not indicated for today  Kristi Nathanel Pipe, MD 11/22/2023 4:52 PM  [facility]

## 2023-11-27 ENCOUNTER — Ambulatory Visit (INDEPENDENT_AMBULATORY_CARE_PROVIDER_SITE_OTHER): Admitting: Advanced Practice Midwife

## 2023-11-27 ENCOUNTER — Encounter: Payer: Self-pay | Admitting: Advanced Practice Midwife

## 2023-11-27 VITALS — BP 111/64 | HR 94 | Wt 209.6 lb

## 2023-11-27 DIAGNOSIS — O99019 Anemia complicating pregnancy, unspecified trimester: Secondary | ICD-10-CM | POA: Diagnosis not present

## 2023-11-27 DIAGNOSIS — O479 False labor, unspecified: Secondary | ICD-10-CM | POA: Diagnosis not present

## 2023-11-27 DIAGNOSIS — Z3A30 30 weeks gestation of pregnancy: Secondary | ICD-10-CM | POA: Diagnosis not present

## 2023-11-27 DIAGNOSIS — Z6791 Unspecified blood type, Rh negative: Secondary | ICD-10-CM | POA: Diagnosis not present

## 2023-11-27 DIAGNOSIS — O26813 Pregnancy related exhaustion and fatigue, third trimester: Secondary | ICD-10-CM

## 2023-11-27 DIAGNOSIS — Z23 Encounter for immunization: Secondary | ICD-10-CM

## 2023-11-27 DIAGNOSIS — Z3480 Encounter for supervision of other normal pregnancy, unspecified trimester: Secondary | ICD-10-CM

## 2023-11-27 MED ORDER — FERROUS GLUCONATE 324 (38 FE) MG PO TABS: 324.0000 mg | ORAL_TABLET | ORAL | 3 refills | Status: DC

## 2023-11-27 NOTE — Progress Notes (Signed)
   PRENATAL VISIT NOTE  Subjective:  Kristi [name] is a 38 y.o. G3P2002 at [redacted]w[redacted]d being seen today for ongoing prenatal care.  She is currently monitored for the following issues for this low-risk pregnancy and has Rh negative state in antepartum period; Retained products of conception, following delivery with hemorrhage; Umbilical hernia without obstruction and without gangrene; Supervision of other normal pregnancy, antepartum; Advanced maternal age in multigravida; History of postpartum hemorrhage, currently pregnant; and Anemia of pregnancy on their problem list.  Patient reports occasional contractions.  Contractions: Irritability. Vag. Bleeding: None.  Movement: Present. Denies leaking of fluid.   The following portions of the patient's history were reviewed and updated as appropriate: allergies, current medications, past family history, past medical history, past social history, past surgical history and problem list.   Objective:    Vitals:   11/27/23 1335  BP: 111/64  Pulse: 94  Weight: 209 lb 9.6 oz (95.1 kg)    Fetal Status:  Fetal Heart Rate (bpm): 142   Movement: Present    General: Alert, oriented and cooperative. Patient is in no acute distress.  Skin: Skin is warm and dry. No rash noted.   Cardiovascular: Normal heart rate noted  Respiratory: Normal respiratory effort, no problems with respiration noted  Abdomen: Soft, gravid, appropriate for gestational age.  Pain/Pressure: Present     Pelvic: Cervical exam deferred        Extremities: Normal range of motion.  Edema: Trace  Mental Status: Normal mood and affect. Normal behavior. Normal judgment and thought content.   Assessment and Plan:  Pregnancy: G3P2002 at [redacted]w[redacted]d 1. Supervision of other normal pregnancy, antepartum (Primary) --Anticipatory guidance about next visits/weeks of pregnancy given.   2. Pregnancy related fatigue in third trimester   3. Antepartum anemia --symptomatic with fatigue. Rx for [employer]  but insurance doesn't cover.  --Ferrous gluconate every other day  4. Rh negative state in antepartum period --Rhogam on 10/14  5. [redacted] weeks gestation of pregnancy  6. Braxton Hicks contractions --Rest/ice/heat/warm bath/increase PO fluids/Tylenol /pregnancy support belt     Preterm labor symptoms and general obstetric precautions including but not limited to vaginal bleeding, contractions, leaking of fluid and fetal movement were reviewed in detail with the patient. Please refer to After Visit Summary for other counseling recommendations.   Return in about 2 weeks (around 12/11/2023) for Midwife preferred.  Future Appointments  Date Time Provider Department Center  12/11/2023  1:50 PM Leftwich-Kirby, Olam LABOR, CNM [facility] None    Olam Boards, CNM

## 2023-11-27 NOTE — Addendum Note (Signed)
 Addended by: Verdis Bassette V on: 11/27/2023 04:42 PM   Modules accepted: Orders

## 2023-11-27 NOTE — Progress Notes (Signed)
 Pt presents for ROB visit. Unable to get iron  due to cost

## 2023-12-11 ENCOUNTER — Ambulatory Visit: Admitting: Advanced Practice Midwife

## 2023-12-11 VITALS — BP 115/63 | HR 77 | Wt 212.0 lb

## 2023-12-11 DIAGNOSIS — O26843 Uterine size-date discrepancy, third trimester: Secondary | ICD-10-CM

## 2023-12-11 DIAGNOSIS — O1203 Gestational edema, third trimester: Secondary | ICD-10-CM

## 2023-12-11 DIAGNOSIS — O26893 Other specified pregnancy related conditions, third trimester: Secondary | ICD-10-CM

## 2023-12-11 DIAGNOSIS — O26813 Pregnancy related exhaustion and fatigue, third trimester: Secondary | ICD-10-CM | POA: Diagnosis not present

## 2023-12-11 DIAGNOSIS — O26899 Other specified pregnancy related conditions, unspecified trimester: Secondary | ICD-10-CM

## 2023-12-11 DIAGNOSIS — Z6791 Unspecified blood type, Rh negative: Secondary | ICD-10-CM | POA: Diagnosis not present

## 2023-12-11 DIAGNOSIS — Z3A32 32 weeks gestation of pregnancy: Secondary | ICD-10-CM | POA: Diagnosis not present

## 2023-12-11 DIAGNOSIS — Z3480 Encounter for supervision of other normal pregnancy, unspecified trimester: Secondary | ICD-10-CM

## 2023-12-11 NOTE — Progress Notes (Signed)
 PRENATAL VISIT NOTE  Subjective:  Kristi [name] is a 38 y.o. G3P2002 at [redacted]w[redacted]d being seen today for ongoing prenatal care.  She is currently monitored for the following issues for this low-risk pregnancy and has Rh negative state in antepartum period; Retained products of conception, following delivery with hemorrhage; Umbilical hernia without obstruction and without gangrene; Supervision of other normal pregnancy, antepartum; Advanced maternal age in multigravida; History of postpartum hemorrhage, currently pregnant; and Anemia of pregnancy on their problem list.  Patient reports occasional contractions and mild edema BLE.  Contractions: Not present. Vag. Bleeding: None.  Movement: Present. Denies leaking of fluid.   The following portions of the patient's history were reviewed and updated as appropriate: allergies, current medications, past family history, past medical history, past social history, past surgical history and problem list.   Objective:   Vitals:   12/11/23 1424  BP: 115/63  Pulse: 77  Weight: 212 lb (96.2 kg)    Fetal Status:  Fetal Heart Rate (bpm): 148 Fundal Height: 35 cm Movement: Present    General: Alert, oriented and cooperative. Patient is in no acute distress.  Skin: Skin is warm and dry. No rash noted.   Cardiovascular: Normal heart rate noted  Respiratory: Normal respiratory effort, no problems with respiration noted  Abdomen: Soft, gravid, appropriate for gestational age.  Pain/Pressure: Present     Pelvic: Cervical exam deferred        Extremities: Normal range of motion.  Edema: Trace  Mental Status: Normal mood and affect. Normal behavior. Normal judgment and thought content.      06/27/2023    2:56 PM 07/19/2020    9:00 AM 02/05/2020    3:58 PM  Depression screen PHQ 2/9  Decreased Interest 0 1 0  Down, Depressed, Hopeless 0 1 0  PHQ - 2 Score 0 2 0  Altered sleeping 0 2 0  Tired, decreased energy 0 0 0  Change in appetite 0 1 0  Feeling bad or  failure about yourself  0 1 0  Trouble concentrating 0 0 0  Moving slowly or fidgety/restless 0 0 0  Suicidal thoughts 0 0 0  PHQ-9 Score 0  6  0   Difficult doing work/chores   Not difficult at all     Data saved with a previous flowsheet row definition        06/27/2023    2:57 PM  GAD 7 : Generalized Anxiety Score  Nervous, Anxious, on Edge 0  Control/stop worrying 0  Worry too much - different things 0  Trouble relaxing 0  Restless 0  Easily annoyed or irritable 0  Afraid - awful might happen 0  Total GAD 7 Score 0    Assessment and Plan:  Pregnancy: G3P2002 at [redacted]w[redacted]d 1. Supervision of other normal pregnancy, antepartum (Primary) --Anticipatory guidance about next visits/weeks of pregnancy given.   2. Rh negative state in antepartum period --S/P Rhogam  3. Pregnancy related fatigue in third trimester   4. [redacted] weeks gestation of pregnancy   5. Uterine size-date discrepancy in third trimester --FH ahead at last visit but recent US  with 40% EFW.  FH is even more ahead today, so will evaluate.   --Hx 8lb baby x 2 with vaginal delivery - US  MFM OB FOLLOW UP; Future  6. Edema during pregnancy in third trimester --Mild, BLE, BP wnl --Elevated, compression   Preterm labor symptoms and general obstetric precautions including but not limited to vaginal bleeding, contractions, leaking of fluid and  fetal movement were reviewed in detail with the patient. Please refer to After Visit Summary for other counseling recommendations.   Return in about 2 weeks (around 12/25/2023) for LOB, Midwife preferred.  No future appointments.  Olam Boards, CNM

## 2023-12-11 NOTE — Progress Notes (Signed)
 Discuss plan for delivery. Denies other concerns

## 2023-12-23 ENCOUNTER — Encounter: Payer: Self-pay | Admitting: Obstetrics and Gynecology

## 2023-12-25 ENCOUNTER — Ambulatory Visit: Admitting: Obstetrics

## 2023-12-25 ENCOUNTER — Encounter: Payer: Self-pay | Admitting: Obstetrics

## 2023-12-25 VITALS — BP 97/61 | HR 89 | Wt 213.6 lb

## 2023-12-25 DIAGNOSIS — O0990 Supervision of high risk pregnancy, unspecified, unspecified trimester: Secondary | ICD-10-CM

## 2023-12-25 DIAGNOSIS — O0993 Supervision of high risk pregnancy, unspecified, third trimester: Secondary | ICD-10-CM

## 2023-12-25 DIAGNOSIS — O99019 Anemia complicating pregnancy, unspecified trimester: Secondary | ICD-10-CM

## 2023-12-25 DIAGNOSIS — O09523 Supervision of elderly multigravida, third trimester: Secondary | ICD-10-CM | POA: Diagnosis not present

## 2023-12-25 DIAGNOSIS — O26899 Other specified pregnancy related conditions, unspecified trimester: Secondary | ICD-10-CM

## 2023-12-25 DIAGNOSIS — O26893 Other specified pregnancy related conditions, third trimester: Secondary | ICD-10-CM

## 2023-12-25 DIAGNOSIS — O09529 Supervision of elderly multigravida, unspecified trimester: Secondary | ICD-10-CM

## 2023-12-25 DIAGNOSIS — O09293 Supervision of pregnancy with other poor reproductive or obstetric history, third trimester: Secondary | ICD-10-CM | POA: Diagnosis not present

## 2023-12-25 DIAGNOSIS — O99013 Anemia complicating pregnancy, third trimester: Secondary | ICD-10-CM

## 2023-12-25 DIAGNOSIS — Z3A34 34 weeks gestation of pregnancy: Secondary | ICD-10-CM

## 2023-12-25 DIAGNOSIS — K429 Umbilical hernia without obstruction or gangrene: Secondary | ICD-10-CM

## 2023-12-25 DIAGNOSIS — Z6791 Unspecified blood type, Rh negative: Secondary | ICD-10-CM

## 2023-12-25 DIAGNOSIS — O09299 Supervision of pregnancy with other poor reproductive or obstetric history, unspecified trimester: Secondary | ICD-10-CM

## 2023-12-25 NOTE — Progress Notes (Signed)
 Subjective:  Kristi [name] is a 38 y.o. G3P2002 at [redacted]w[redacted]d being seen today for ongoing prenatal care.  She is currently monitored for the following issues for this high-risk pregnancy and has Rh negative state in antepartum period; Retained products of conception, following delivery with hemorrhage; Umbilical hernia without obstruction and without gangrene; Supervision of other normal pregnancy, antepartum; Advanced maternal age in multigravida; History of postpartum hemorrhage, currently pregnant; and Anemia of pregnancy on their problem list.  Patient reports headache and heartburn.   .  .   . Denies leaking of fluid.   The following portions of the patient's history were reviewed and updated as appropriate: allergies, current medications, past family history, past medical history, past social history, past surgical history and problem list. Problem list updated.  Objective:  There were no vitals filed for this visit.  Fetal Status:           General:  Alert, oriented and cooperative. Patient is in no acute distress.  Skin: Skin is warm and dry. No rash noted.   Cardiovascular: Normal heart rate noted  Respiratory: Normal respiratory effort, no problems with respiration noted  Abdomen: Soft, gravid, appropriate for gestational age.       Pelvic:  Cervical exam deferred        Extremities: Normal range of motion.     Mental Status: Normal mood and affect. Normal behavior. Normal judgment and thought content.   Urinalysis:      Assessment and Plan:  Pregnancy: G3P2002 at [redacted]w[redacted]d  1. Supervision of high risk pregnancy, antepartum (Primary)  2. Antepartum multigravida of advanced maternal age  79. History of postpartum hemorrhage, currently pregnant  4. Retained products of conception, following delivery with hemorrhage  5. Rh negative state in antepartum period - Rhogam postpartum  6. Antepartum anemia - clinically stable.  Taking iron .  7. Umbilical hernia without obstruction and  without gangrene - clinically stable.  Refer to General Surgery after delivery.   Preterm labor symptoms and general obstetric precautions including but not limited to vaginal bleeding, contractions, leaking of fluid and fetal movement were reviewed in detail with the patient. Please refer to After Visit Summary for other counseling recommendations.   Return in about 2 weeks (around 01/08/2024).   Rudy Carlin LABOR, MD 12/25/2023

## 2023-12-25 NOTE — Progress Notes (Signed)
 Pt presents for ROB visit. No concerns

## 2024-01-08 ENCOUNTER — Encounter: Payer: Self-pay | Admitting: Advanced Practice Midwife

## 2024-01-08 ENCOUNTER — Ambulatory Visit: Admitting: Advanced Practice Midwife

## 2024-01-08 ENCOUNTER — Other Ambulatory Visit (HOSPITAL_COMMUNITY)
Admission: RE | Admit: 2024-01-08 | Discharge: 2024-01-08 | Disposition: A | Discharge status: Home / Self Care | Source: Ambulatory Visit | Attending: Advanced Practice Midwife | Admitting: Advanced Practice Midwife

## 2024-01-08 VITALS — BP 105/64 | HR 80 | Wt 216.6 lb

## 2024-01-08 DIAGNOSIS — Z113 Encounter for screening for infections with a predominantly sexual mode of transmission: Secondary | ICD-10-CM | POA: Insufficient documentation

## 2024-01-08 DIAGNOSIS — O09523 Supervision of elderly multigravida, third trimester: Secondary | ICD-10-CM | POA: Diagnosis present

## 2024-01-08 DIAGNOSIS — O26893 Other specified pregnancy related conditions, third trimester: Secondary | ICD-10-CM | POA: Diagnosis not present

## 2024-01-08 DIAGNOSIS — O09529 Supervision of elderly multigravida, unspecified trimester: Secondary | ICD-10-CM

## 2024-01-08 DIAGNOSIS — O09299 Supervision of pregnancy with other poor reproductive or obstetric history, unspecified trimester: Secondary | ICD-10-CM

## 2024-01-08 DIAGNOSIS — O09293 Supervision of pregnancy with other poor reproductive or obstetric history, third trimester: Secondary | ICD-10-CM | POA: Diagnosis not present

## 2024-01-08 DIAGNOSIS — Z3A36 36 weeks gestation of pregnancy: Secondary | ICD-10-CM

## 2024-01-08 DIAGNOSIS — Z6791 Unspecified blood type, Rh negative: Secondary | ICD-10-CM | POA: Diagnosis not present

## 2024-01-08 DIAGNOSIS — Z3480 Encounter for supervision of other normal pregnancy, unspecified trimester: Secondary | ICD-10-CM

## 2024-01-08 NOTE — Progress Notes (Signed)
 36 weeks swabs  Pt reports left hip pain (7 out of 10) for the past 3 weeks.

## 2024-01-08 NOTE — Progress Notes (Signed)
   PRENATAL VISIT NOTE  Subjective:  Kristi [name] is a 38 y.o. G3P2002 at [redacted]w[redacted]d being seen today for ongoing prenatal care.  She is currently monitored for the following issues for this low-risk pregnancy and has Rh negative state in antepartum period; Retained products of conception, following delivery with hemorrhage; Umbilical hernia without obstruction and without gangrene; Supervision of other normal pregnancy, antepartum; Advanced maternal age in multigravida; History of postpartum hemorrhage, currently pregnant; and Anemia of pregnancy on their problem list.  Patient reports bilateral hip pain.  Contractions: Irritability. Vag. Bleeding: None.  Movement: Present. Denies leaking of fluid.   The following portions of the patient's history were reviewed and updated as appropriate: allergies, current medications, past family history, past medical history, past social history, past surgical history and problem list.   Objective:    Vitals:   01/08/24 1441  BP: 105/64  Pulse: 80  Weight: 216 lb 9.6 oz (98.2 kg)    Fetal Status:  Fetal Heart Rate (bpm): 148 Fundal Height: 37 cm Movement: Present Presentation: Vertex (confirmed by [place] and bedside US )  General: Alert, oriented and cooperative. Patient is in no acute distress.  Skin: Skin is warm and dry. No rash noted.   Cardiovascular: Normal heart rate noted  Respiratory: Normal respiratory effort, no problems with respiration noted  Abdomen: Soft, gravid, appropriate for gestational age.  Pain/Pressure: Present     Pelvic: Cervical exam performed in the presence of a chaperone Dilation: Closed Effacement (%): 0 Station: -3  Extremities: Normal range of motion.  Edema: Trace  Mental Status: Normal mood and affect. Normal behavior. Normal judgment and thought content.   Assessment and Plan:  Pregnancy: G3P2002 at [redacted]w[redacted]d 1. Supervision of other normal pregnancy, antepartum (Primary) --Anticipatory guidance about next visits/weeks  of pregnancy given.   2. Antepartum multigravida of advanced maternal age   20. History of postpartum hemorrhage, currently pregnant   4. Rh negative state in antepartum period --S/P rhogam  5. [redacted] weeks gestation of pregnancy  - Cervicovaginal ancillary only( [place]) - Culture, beta strep (group b only)  Preterm labor symptoms and general obstetric precautions including but not limited to vaginal bleeding, contractions, leaking of fluid and fetal movement were reviewed in detail with the patient. Please refer to After Visit Summary for other counseling recommendations.   No follow-ups on file.  Future Appointments  Date Time Provider Department Center  01/14/2024 10:15 AM Zina Jerilynn LABOR, MD [facility] None  01/18/2024 11:15 AM [facility] PROVIDER 1 [facility]  01/18/2024 11:30 AM [facility] US3 [facility]  01/21/2024 10:55 AM Constant, Winton, MD [facility] None  01/28/2024 10:15 AM Constant, Winton, MD [facility] None    Rolin Amel, SNP   Midwife Attestation:  I personally saw and evaluated the patient, performing the key elements of the service. I developed and verified the management plan that is described in the resident's/student's note, and I agree with the content with my edits above. VSS, HRR&R, Resp unlabored, Legs neg.    Olam Boards, CNM 6:09 PM

## 2024-01-09 LAB — CERVICOVAGINAL ANCILLARY ONLY
Bacterial Vaginitis (gardnerella): NEGATIVE
Candida Glabrata: NEGATIVE
Candida Vaginitis: NEGATIVE
Chlamydia: NEGATIVE
Comment: NEGATIVE
Comment: NEGATIVE
Comment: NEGATIVE
Comment: NEGATIVE
Comment: NEGATIVE
Comment: NORMAL
Neisseria Gonorrhea: NEGATIVE
Trichomonas: NEGATIVE

## 2024-01-12 LAB — CULTURE, BETA STREP (GROUP B ONLY): Strep Gp B Culture: NEGATIVE

## 2024-01-14 ENCOUNTER — Ambulatory Visit (INDEPENDENT_AMBULATORY_CARE_PROVIDER_SITE_OTHER): Admitting: Obstetrics and Gynecology

## 2024-01-14 VITALS — BP 111/72 | HR 84 | Wt 214.0 lb

## 2024-01-14 DIAGNOSIS — K429 Umbilical hernia without obstruction or gangrene: Secondary | ICD-10-CM | POA: Diagnosis not present

## 2024-01-14 DIAGNOSIS — Z6791 Unspecified blood type, Rh negative: Secondary | ICD-10-CM | POA: Diagnosis not present

## 2024-01-14 DIAGNOSIS — O26893 Other specified pregnancy related conditions, third trimester: Secondary | ICD-10-CM

## 2024-01-14 DIAGNOSIS — O09523 Supervision of elderly multigravida, third trimester: Secondary | ICD-10-CM

## 2024-01-14 DIAGNOSIS — O09299 Supervision of pregnancy with other poor reproductive or obstetric history, unspecified trimester: Secondary | ICD-10-CM

## 2024-01-14 DIAGNOSIS — Z3A37 37 weeks gestation of pregnancy: Secondary | ICD-10-CM | POA: Diagnosis not present

## 2024-01-14 DIAGNOSIS — Z3480 Encounter for supervision of other normal pregnancy, unspecified trimester: Secondary | ICD-10-CM

## 2024-01-14 DIAGNOSIS — O26899 Other specified pregnancy related conditions, unspecified trimester: Secondary | ICD-10-CM

## 2024-01-14 NOTE — Progress Notes (Signed)
° °  PRENATAL VISIT NOTE  Subjective:  Kristi [name] is a 38 y.o. G3P2002 at [redacted]w[redacted]d being seen today for ongoing prenatal care.  She is currently monitored for the following issues for this low-risk pregnancy and has Rh negative state in antepartum period; Retained products of conception, following delivery with hemorrhage; Umbilical hernia without obstruction and without gangrene; Supervision of other normal pregnancy, antepartum; Advanced maternal age in multigravida; History of postpartum hemorrhage, currently pregnant; and Anemia of pregnancy on their problem list.  Patient doing well with no acute concerns today. She reports occasional contractions.  Contractions: Not present. Vag. Bleeding: None.  Movement: Present. Denies leaking of fluid.   The following portions of the patient's history were reviewed and updated as appropriate: allergies, current medications, past family history, past medical history, past social history, past surgical history and problem list. Problem list updated.  Objective:   Vitals:   01/14/24 1037  BP: 111/72  Pulse: 84  Weight: 214 lb (97.1 kg)    Fetal Status: Fetal Heart Rate (bpm): 142 Fundal Height: 37 cm Movement: Present     General:  Alert, oriented and cooperative. Patient is in no acute distress.  Skin: Skin is warm and dry. No rash noted.   Cardiovascular: Normal heart rate noted  Respiratory: Normal respiratory effort, no problems with respiration noted  Abdomen: Soft, gravid, appropriate for gestational age.  Pain/Pressure: Absent     Pelvic: Cervical exam deferred        Extremities: Normal range of motion.  Edema: Trace  Mental Status:  Normal mood and affect. Normal behavior. Normal judgment and thought content.   Assessment and Plan:  Pregnancy: G3P2002 at [redacted]w[redacted]d  1. Supervision of other normal pregnancy, antepartum (Primary) Continue routine prenatal care  2. [redacted] weeks gestation of pregnancy   3. Umbilical hernia without obstruction  and without gangrene No issues currently  4. Rh negative state in antepartum period Rhogam post delivery  5. Retained products of conception, following delivery with hemorrhage Monitor post delivery, eval placenta closely  6. History of postpartum hemorrhage, currently pregnant Monitor for risk factors, consider early intervention if needed  7. Multigravida of advanced maternal age in third trimester   Term labor symptoms and general obstetric precautions including but not limited to vaginal bleeding, contractions, leaking of fluid and fetal movement were reviewed in detail with the patient.  Please refer to After Visit Summary for other counseling recommendations.   Return in about 1 week (around 01/21/2024) for ROB, in person.   Jerilynn Buddle, MD Faculty Attending Center for [facility]

## 2024-01-15 ENCOUNTER — Encounter: Admitting: Obstetrics and Gynecology

## 2024-01-17 ENCOUNTER — Encounter: Payer: Self-pay | Admitting: Advanced Practice Midwife

## 2024-01-18 ENCOUNTER — Ambulatory Visit

## 2024-01-18 ENCOUNTER — Ambulatory Visit: Attending: Obstetrics and Gynecology | Admitting: Obstetrics and Gynecology

## 2024-01-18 VITALS — BP 107/61 | HR 79

## 2024-01-18 DIAGNOSIS — O09523 Supervision of elderly multigravida, third trimester: Secondary | ICD-10-CM | POA: Diagnosis not present

## 2024-01-18 DIAGNOSIS — Z6791 Unspecified blood type, Rh negative: Secondary | ICD-10-CM | POA: Insufficient documentation

## 2024-01-18 DIAGNOSIS — Z8751 Personal history of pre-term labor: Secondary | ICD-10-CM | POA: Diagnosis not present

## 2024-01-18 DIAGNOSIS — O99013 Anemia complicating pregnancy, third trimester: Secondary | ICD-10-CM | POA: Diagnosis present

## 2024-01-18 DIAGNOSIS — Z3480 Encounter for supervision of other normal pregnancy, unspecified trimester: Secondary | ICD-10-CM

## 2024-01-18 DIAGNOSIS — Z79899 Other long term (current) drug therapy: Secondary | ICD-10-CM | POA: Insufficient documentation

## 2024-01-18 DIAGNOSIS — Z3A37 37 weeks gestation of pregnancy: Secondary | ICD-10-CM | POA: Insufficient documentation

## 2024-01-18 DIAGNOSIS — Z364 Encounter for antenatal screening for fetal growth retardation: Secondary | ICD-10-CM | POA: Insufficient documentation

## 2024-01-18 DIAGNOSIS — O26843 Uterine size-date discrepancy, third trimester: Secondary | ICD-10-CM | POA: Diagnosis not present

## 2024-01-18 DIAGNOSIS — O360130 Maternal care for anti-D [Rh] antibodies, third trimester, not applicable or unspecified: Secondary | ICD-10-CM | POA: Diagnosis not present

## 2024-01-18 DIAGNOSIS — O26893 Other specified pregnancy related conditions, third trimester: Secondary | ICD-10-CM | POA: Diagnosis not present

## 2024-01-18 DIAGNOSIS — O09293 Supervision of pregnancy with other poor reproductive or obstetric history, third trimester: Secondary | ICD-10-CM

## 2024-01-18 NOTE — Progress Notes (Signed)
 Maternal-Fetal Medicine Consultation  Name: Kristi [name]  MRN: [id number]  GA: [id number] [redacted]w[redacted]d   Patient is here for fetal growth assessment.  Obstetrical history is significant for preterm vaginal deliveries.  Her first delivery was complicated by postpartum hemorrhage but she did not have blood transfusions.  Patient does not have gestational diabetes.  She has mild anemia, takes iron  supplements. On screening, she had non-specific antibodies (anti-M antibodies 3 years ago)  She is Rh negative and received rhogam in this pregnancy.  Ultrasound Normal fetal growth and amniotic fluid.  Cephalic presentation.  I reassured patient of the findings.  I counseled the patient that the recurrence of postpartum hemorrhage is slightly higher than patients without postpartum hemorrhage. I encouraged her to continue iron  supplements. Because of her risk of PPH is slightly high, I recommend screening for antibodies before delivery and contact blood bank for availability of compatible blood for transfusion.  Recommendations No follow-up appointments were made     Consultation including face-to-face (more than 50%) counseling 20 minutes.

## 2024-01-21 ENCOUNTER — Ambulatory Visit (INDEPENDENT_AMBULATORY_CARE_PROVIDER_SITE_OTHER): Admitting: Obstetrics and Gynecology

## 2024-01-21 ENCOUNTER — Encounter: Payer: Self-pay | Admitting: Obstetrics and Gynecology

## 2024-01-21 VITALS — BP 104/66 | HR 98 | Wt 217.0 lb

## 2024-01-21 DIAGNOSIS — O09293 Supervision of pregnancy with other poor reproductive or obstetric history, third trimester: Secondary | ICD-10-CM | POA: Diagnosis not present

## 2024-01-21 DIAGNOSIS — Z3A38 38 weeks gestation of pregnancy: Secondary | ICD-10-CM

## 2024-01-21 DIAGNOSIS — O26893 Other specified pregnancy related conditions, third trimester: Secondary | ICD-10-CM

## 2024-01-21 DIAGNOSIS — Z6791 Unspecified blood type, Rh negative: Secondary | ICD-10-CM

## 2024-01-21 DIAGNOSIS — O09299 Supervision of pregnancy with other poor reproductive or obstetric history, unspecified trimester: Secondary | ICD-10-CM

## 2024-01-21 DIAGNOSIS — O09523 Supervision of elderly multigravida, third trimester: Secondary | ICD-10-CM

## 2024-01-21 DIAGNOSIS — Z3480 Encounter for supervision of other normal pregnancy, unspecified trimester: Secondary | ICD-10-CM

## 2024-01-21 NOTE — Progress Notes (Signed)
" ° °  PRENATAL VISIT NOTE  Subjective:  Kristi [name] is a 38 y.o. G3P2002 at [redacted]w[redacted]d being seen today for ongoing prenatal care.  She is currently monitored for the following issues for this high-risk pregnancy and has Rh negative state in antepartum period; Retained products of conception, following delivery with hemorrhage; Umbilical hernia without obstruction and without gangrene; Supervision of other normal pregnancy, antepartum; Advanced maternal age in multigravida; History of postpartum hemorrhage, currently pregnant; and Anemia of pregnancy on their problem list.  Patient reports no complaints.  Contractions: Not present. Vag. Bleeding: None.  Movement: Present. Denies leaking of fluid.   The following portions of the patient's history were reviewed and updated as appropriate: allergies, current medications, past family history, past medical history, past social history, past surgical history and problem list.   Objective:   Vitals:   01/21/24 1117  BP: 104/66  Pulse: 98  Weight: 217 lb (98.4 kg)    Fetal Status:      Movement: Present    General: Alert, oriented and cooperative. Patient is in no acute distress.  Skin: Skin is warm and dry. No rash noted.   Cardiovascular: Normal heart rate noted  Respiratory: Normal respiratory effort, no problems with respiration noted  Abdomen: Soft, gravid, appropriate for gestational age.  Pain/Pressure: Absent     Pelvic: Cervical exam deferred        Extremities: Normal range of motion.  Edema: Trace  Mental Status: Normal mood and affect. Normal behavior. Normal judgment and thought content.      01/14/2024   10:41 AM 06/27/2023    2:56 PM 07/19/2020    9:00 AM  Depression screen PHQ 2/9  Decreased Interest 0 0 1  Down, Depressed, Hopeless 0 0 1  PHQ - 2 Score 0 0 2  Altered sleeping 0 0 2  Tired, decreased energy 0 0 0  Change in appetite 0 0 1  Feeling bad or failure about yourself  0 0 1  Trouble concentrating 0 0 0  Moving  slowly or fidgety/restless 0 0 0  Suicidal thoughts 0 0 0  PHQ-9 Score 0 0  6   Difficult doing work/chores Not difficult at all       Data saved with a previous flowsheet row definition        06/27/2023    2:57 PM  GAD 7 : Generalized Anxiety Score  Nervous, Anxious, on Edge 0  Control/stop worrying 0  Worry too much - different things 0  Trouble relaxing 0  Restless 0  Easily annoyed or irritable 0  Afraid - awful might happen 0  Total GAD 7 Score 0    Assessment and Plan:  Pregnancy: G3P2002 at [redacted]w[redacted]d 1. Supervision of other normal pregnancy, antepartum (Primary) Patient is doing well without complaints  2. Multigravida of advanced maternal age in third trimester Normal genetic screening  3. History of postpartum hemorrhage, currently pregnant Close monitoring in the peripartum and immediate postpartum period  4. Rh negative state in antepartum period S/p rhogam  Preterm labor symptoms and general obstetric precautions including but not limited to vaginal bleeding, contractions, leaking of fluid and fetal movement were reviewed in detail with the patient. Please refer to After Visit Summary for other counseling recommendations.   Return in about 1 week (around 01/28/2024) for in person, ROB, High risk.  Future Appointments  Date Time Provider Department Center  01/28/2024 10:15 AM Jayke Caul, Winton, MD [facility] None    Winton Felt, MD  "

## 2024-01-28 ENCOUNTER — Ambulatory Visit (INDEPENDENT_AMBULATORY_CARE_PROVIDER_SITE_OTHER): Admitting: Obstetrics and Gynecology

## 2024-01-28 ENCOUNTER — Encounter: Payer: Self-pay | Admitting: Obstetrics and Gynecology

## 2024-01-28 VITALS — BP 96/67 | HR 88 | Wt 217.0 lb

## 2024-01-28 DIAGNOSIS — Z3480 Encounter for supervision of other normal pregnancy, unspecified trimester: Secondary | ICD-10-CM

## 2024-01-28 DIAGNOSIS — Z6791 Unspecified blood type, Rh negative: Secondary | ICD-10-CM | POA: Diagnosis not present

## 2024-01-28 DIAGNOSIS — O99013 Anemia complicating pregnancy, third trimester: Secondary | ICD-10-CM | POA: Diagnosis not present

## 2024-01-28 DIAGNOSIS — O99019 Anemia complicating pregnancy, unspecified trimester: Secondary | ICD-10-CM

## 2024-01-28 DIAGNOSIS — O26893 Other specified pregnancy related conditions, third trimester: Secondary | ICD-10-CM

## 2024-01-28 DIAGNOSIS — O09523 Supervision of elderly multigravida, third trimester: Secondary | ICD-10-CM | POA: Diagnosis not present

## 2024-01-28 DIAGNOSIS — Z3A39 39 weeks gestation of pregnancy: Secondary | ICD-10-CM

## 2024-01-28 NOTE — Progress Notes (Signed)
 ROB, wants to discuss [facility] options today.

## 2024-01-28 NOTE — Progress Notes (Signed)
 "  PRENATAL VISIT NOTE  Subjective:  Kristi [name] is a 38 y.o. G3P2002 at [redacted]w[redacted]d being seen today for ongoing prenatal care.  She is currently monitored for the following issues for this low-risk pregnancy and has Rh negative state in antepartum period; Retained products of conception, following delivery with hemorrhage; Umbilical hernia without obstruction and without gangrene; Supervision of other normal pregnancy, antepartum; Advanced maternal age in multigravida; History of postpartum hemorrhage, currently pregnant; and Anemia of pregnancy on their problem list.  Patient reports no complaints.  Contractions: Irregular. Vag. Bleeding: None.  Movement: Present. Denies leaking of fluid.   The following portions of the patient's history were reviewed and updated as appropriate: allergies, current medications, past family history, past medical history, past social history, past surgical history and problem list.   Objective:   Vitals:   01/28/24 1035  BP: 96/67  Pulse: 88  Weight: 217 lb (98.4 kg)    Fetal Status:  Fetal Heart Rate (bpm): 148 Fundal Height: 40 cm Movement: Present    General: Alert, oriented and cooperative. Patient is in no acute distress.  Skin: Skin is warm and dry. No rash noted.   Cardiovascular: Normal heart rate noted  Respiratory: Normal respiratory effort, no problems with respiration noted  Abdomen: Soft, gravid, appropriate for gestational age.  Pain/Pressure: Present     Pelvic: Cervical exam performed in the presence of a chaperone Dilation: Closed Effacement (%): 0 Station: Ballotable  Extremities: Normal range of motion.  Edema: Trace  Mental Status: Normal mood and affect. Normal behavior. Normal judgment and thought content.      01/14/2024   10:41 AM 06/27/2023    2:56 PM 07/19/2020    9:00 AM  Depression screen PHQ 2/9  Decreased Interest 0 0 1  Down, Depressed, Hopeless 0 0 1  PHQ - 2 Score 0 0 2  Altered sleeping 0 0 2  Tired, decreased  energy 0 0 0  Change in appetite 0 0 1  Feeling bad or failure about yourself  0 0 1  Trouble concentrating 0 0 0  Moving slowly or fidgety/restless 0 0 0  Suicidal thoughts 0 0 0  PHQ-9 Score 0 0  6   Difficult doing work/chores Not difficult at all       Data saved with a previous flowsheet row definition        06/27/2023    2:57 PM  GAD 7 : Generalized Anxiety Score  Nervous, Anxious, on Edge 0  Control/stop worrying 0  Worry too much - different things 0  Trouble relaxing 0  Restless 0  Easily annoyed or irritable 0  Afraid - awful might happen 0  Total GAD 7 Score 0    Assessment and Plan:  Pregnancy: G3P2002 at [redacted]w[redacted]d 1. Supervision of other normal pregnancy, antepartum (Primary) Patient is doing well without complaints Will plan for IOL at 41 weeks  2. Rh negative state in antepartum period S/p rhogam  3. Antepartum anemia Continue iron  supplement  4. Multigravida of advanced maternal age in third trimester   Term labor symptoms and general obstetric precautions including but not limited to vaginal bleeding, contractions, leaking of fluid and fetal movement were reviewed in detail with the patient. Please refer to After Visit Summary for other counseling recommendations.   Return in about 1 week (around 02/04/2024) for in person, ROB, Low risk, NST, AFI.  Future Appointments  Date Time Provider Department Center  02/11/2024  7:15 AM [facility] SCHED ROOM [facility] None  Winton Felt, MD  "

## 2024-01-31 ENCOUNTER — Encounter: Payer: Self-pay | Admitting: Obstetrics and Gynecology

## 2024-01-31 NOTE — L&D Delivery Note (Cosign Needed Addendum)
 OB/GYN Faculty Practice Delivery Note  Kristi [name] is a 39 y.o. [id number] s/p VD at [redacted]w[redacted]d. She was admitted for PD IOL.   ROM: 8h 53m with clear fluid GBS Status: Negative/-- (12/09 1640)  Maximum Maternal Temperature: 98.9  Labor Progress: Initial SVE: 2/50/-3 . AROM, Pitocin , and Cytotec . She then progressed to complete.   Delivery Date/Time: 02/11/2024 3:13 AM Delivery: Called to room and patient was complete and pushing. Head delivered right occiput anterior. nuchal cord absent. Shoulder and body delivered in usual fashion. Infant with spontaneous cry, placed on mother's abdomen, dried and stimulated. Cord clamped x 2 after 1-minute delay, and cut by father. Cord blood collected . Placenta delivered spontaneously with gentle cord traction. Fundus firm with massage and Pitocin . Labia, perineum, vagina, and cervix were inspected and found to be intact.  Placenta:  spontaneous Intact Placenta to L&D Complications: N/A Lacerations: none QBL: Analgesia: Epidural  Newborn Data:  Living status:Living Gender:Female Apgars:9 ,9  Weight:            Leafy Scriver, DO PGY1, [facility]   Attestation of Supervision of Student:  I confirm that I have verified the information documented in the resident's note and that I have also personally directly supervised the history, physical exam and all medical decision making activities.  I have verified that all services and findings are accurately documented in this student's note; and I agree with management and plan as outlined in the documentation. I have also made any necessary editorial changes.   Barkley LITTIE Angles, MD OB Fellow 02/11/2024 3:55 AM

## 2024-02-04 ENCOUNTER — Inpatient Hospital Stay (HOSPITAL_COMMUNITY)
Admission: AD | Admit: 2024-02-04 | Discharge: 2024-02-04 | Disposition: A | Discharge status: Home / Self Care | Attending: Obstetrics & Gynecology | Admitting: Obstetrics & Gynecology

## 2024-02-04 ENCOUNTER — Encounter (HOSPITAL_COMMUNITY): Payer: Self-pay | Admitting: *Deleted

## 2024-02-04 ENCOUNTER — Encounter (HOSPITAL_COMMUNITY): Payer: Self-pay | Admitting: Obstetrics & Gynecology

## 2024-02-04 ENCOUNTER — Ambulatory Visit (INDEPENDENT_AMBULATORY_CARE_PROVIDER_SITE_OTHER): Payer: Self-pay | Admitting: Obstetrics and Gynecology

## 2024-02-04 ENCOUNTER — Telehealth (HOSPITAL_COMMUNITY): Payer: Self-pay | Admitting: *Deleted

## 2024-02-04 ENCOUNTER — Other Ambulatory Visit: Payer: Self-pay

## 2024-02-04 VITALS — BP 123/66 | HR 88 | Wt 220.5 lb

## 2024-02-04 DIAGNOSIS — Z0371 Encounter for suspected problem with amniotic cavity and membrane ruled out: Secondary | ICD-10-CM | POA: Insufficient documentation

## 2024-02-04 DIAGNOSIS — O99013 Anemia complicating pregnancy, third trimester: Secondary | ICD-10-CM

## 2024-02-04 DIAGNOSIS — O09299 Supervision of pregnancy with other poor reproductive or obstetric history, unspecified trimester: Secondary | ICD-10-CM

## 2024-02-04 DIAGNOSIS — O480 Post-term pregnancy: Secondary | ICD-10-CM

## 2024-02-04 DIAGNOSIS — Z3A40 40 weeks gestation of pregnancy: Secondary | ICD-10-CM | POA: Insufficient documentation

## 2024-02-04 DIAGNOSIS — Z3689 Encounter for other specified antenatal screening: Secondary | ICD-10-CM | POA: Diagnosis present

## 2024-02-04 DIAGNOSIS — K429 Umbilical hernia without obstruction or gangrene: Secondary | ICD-10-CM | POA: Diagnosis not present

## 2024-02-04 DIAGNOSIS — O09293 Supervision of pregnancy with other poor reproductive or obstetric history, third trimester: Secondary | ICD-10-CM | POA: Diagnosis not present

## 2024-02-04 DIAGNOSIS — O26893 Other specified pregnancy related conditions, third trimester: Secondary | ICD-10-CM

## 2024-02-04 DIAGNOSIS — Z6791 Unspecified blood type, Rh negative: Secondary | ICD-10-CM

## 2024-02-04 DIAGNOSIS — Z3480 Encounter for supervision of other normal pregnancy, unspecified trimester: Secondary | ICD-10-CM

## 2024-02-04 DIAGNOSIS — O09523 Supervision of elderly multigravida, third trimester: Secondary | ICD-10-CM

## 2024-02-04 DIAGNOSIS — O26899 Other specified pregnancy related conditions, unspecified trimester: Secondary | ICD-10-CM

## 2024-02-04 LAB — URINALYSIS, ROUTINE W REFLEX MICROSCOPIC
Bilirubin Urine: NEGATIVE
Glucose, UA: NEGATIVE mg/dL
Hgb urine dipstick: NEGATIVE
Ketones, ur: NEGATIVE mg/dL
Nitrite: NEGATIVE
Protein, ur: NEGATIVE mg/dL
Specific Gravity, Urine: 1.019 (ref 1.005–1.030)
pH: 6 (ref 5.0–8.0)

## 2024-02-04 LAB — POCT FERN TEST: POCT Fern Test: NEGATIVE

## 2024-02-04 NOTE — MAU Provider Note (Signed)
 " History     CSN: [id number]  Arrival date and time: 02/04/24 1248   Event Date/Time   First Provider Initiated Contact with Patient 02/04/24 1339      Chief Complaint  Patient presents with   Non-stress Test   HPI Kristi [name] is a 39 y.o. G3P2002 at [redacted]w[redacted]d who presents for fetal monitoring. In office today for ROB, had NST due to gestational age. Non reactive in office so sent to MAU. Denies abdominal pain, vaginal bleeding. Reports episode of watery discharge yesterday that did not continue. Reports normal fetal movement. She's scheduled for IOL on Saturday.   OB History     Gravida  3   Para  2   Term  2   Preterm  0   AB  0   Living  2      SAB  0   IAB  0   Ectopic  0   Multiple  0   Live Births  2        Obstetric Comments  D&E on 07/20/17 (8 weeks PP) for RPOCs         Past Medical History:  Diagnosis Date   Pap smear abnormality of cervix     Past Surgical History:  Procedure Laterality Date   COLPOSCOPY  2013-2014   DILATION AND EVACUATION N/A 07/20/2017   Procedure: DILATATION AND EVACUATION;  Surgeon: Herchel Gloris LABOR, MD;  Location: [facility];  Service: Gynecology;  Laterality: N/A;   SIGMOIDOSCOPY      Family History  Problem Relation Age of Onset   Hypertension Mother    Stroke Mother        tia   Cancer - Colon Mother        Stage 0 with colon resection   Hypertension Father    Colon cancer Maternal Grandfather    Ovarian cancer Paternal Grandmother    Diabetes Paternal Grandmother     Social History[1]  Allergies: Allergies[2]  Medications Prior to Admission  Medication Sig Dispense Refill Last Dose/Taking   aspirin  81 MG chewable tablet Chew 1 tablet (81 mg total) by mouth daily. 90 tablet 3 02/03/2024   Evening Primrose Oil 1000 MG CAPS Take 1 capsule by mouth once.   02/03/2024   ferrous gluconate  (FERGON) 324 MG tablet Take 1 tablet (324 mg total) by mouth every other day. 30 tablet 3 02/03/2024   Prenatal Vit-Fe  Fumarate-FA (PRENATAL PO) Take 1 tablet by mouth daily.   02/03/2024   acetaminophen  (TYLENOL ) 325 MG tablet Take 650 mg by mouth every 6 (six) hours as needed for mild pain (pain score 1-3).      docusate sodium  (COLACE) 50 MG capsule Take 50 mg by mouth 2 (two) times daily.       Review of Systems  All other systems reviewed and are negative.  Physical Exam   Blood pressure 117/71, pulse 89, temperature 98.5 F (36.9 C), temperature source Oral, resp. rate 18, height 5' 5.5 (1.664 m), weight 100.3 kg, last menstrual period 04/30/2023, SpO2 98%.  Physical Exam Vitals and nursing note reviewed. Exam conducted with a chaperone present.  Constitutional:      General: She is not in acute distress.    Appearance: Normal appearance. She is not ill-appearing.  HENT:     Head: Normocephalic and atraumatic.  Eyes:     General: No scleral icterus.    Pupils: Pupils are equal, round, and reactive to light.  Pulmonary:     Effort:  Pulmonary effort is normal. No respiratory distress.  Abdominal:     Tenderness: There is no abdominal tenderness.     Comments: gravid  Genitourinary:    Comments: SSE: no blood. No pooling.  Dilation: Closed Effacement (%): 50 Station: Ballotable Exam by:: CHARLENA Satterfield, NP  Skin:    General: Skin is warm and dry.  Neurological:     Mental Status: She is alert.    NST:  Baseline: 140 bpm, Variability: Good {> 6 bpm), Accelerations: Reactive, and Decelerations: Absent   Pt informed that the ultrasound is considered a limited OB ultrasound and is not intended to be a complete ultrasound exam.  Patient also informed that the ultrasound is not being completed with the intent of assessing for fetal or placental anomalies or any pelvic abnormalities.  Explained that the purpose of todays ultrasound is to assess for  presentation and AFI.  Patient acknowledges the purpose of the exam and the limitations of the study.   Cephalic. MVP 3.74    MAU Course   Procedures Results for orders placed or performed during the hospital encounter of 02/04/24 (from the past 24 hours)  Urinalysis, Routine w reflex microscopic -Urine, Clean Catch     Status: Abnormal   Collection Time: 02/04/24  1:18 PM  Result Value Ref Range   Color, Urine YELLOW YELLOW   APPearance CLOUDY (A) CLEAR   Specific Gravity, Urine 1.019 1.005 - 1.030   pH 6.0 5.0 - 8.0   Glucose, UA NEGATIVE NEGATIVE mg/dL   Hgb urine dipstick NEGATIVE NEGATIVE   Bilirubin Urine NEGATIVE NEGATIVE   Ketones, ur NEGATIVE NEGATIVE mg/dL   Protein, ur NEGATIVE NEGATIVE mg/dL   Nitrite NEGATIVE NEGATIVE   Leukocytes,Ua SMALL (A) NEGATIVE   RBC / HPF 0-5 0 - 5 RBC/hpf   WBC, UA 0-5 0 - 5 WBC/hpf   Bacteria, UA RARE (A) NONE SEEN   Squamous Epithelial / HPF 0-5 0 - 5 /HPF   Mucus PRESENT   POCT fern test     Status: None   Collection Time: 02/04/24  2:58 PM  Result Value Ref Range   POCT Fern Test Negative = intact amniotic membranes     MDM   Assessment and Plan   1. Encounter for suspected PROM, with rupture of membranes not found   2. NST (non-stress test) reactive   3. [redacted] weeks gestation of pregnancy    -SSE performed. No pooling. Fern negative & MVP 3.74 -Reactive NST. Good fetal movement.  -Reviewed patient with Dr. Ozan, tracing reviewed. Ok for discharge home. Patient scheduled for IOL later this week. Reviewed return precautions.   Rocky Satterfield 02/04/2024, 2:34 PM      [1]  Social History Tobacco Use   Smoking status: Never   Smokeless tobacco: Never  Vaping Use   Vaping status: Never Used  Substance Use Topics   Alcohol use: Not Currently    Comment: social   Drug use: No  [2]  Allergies Allergen Reactions   Adhesive [Tape] Rash   "

## 2024-02-04 NOTE — Telephone Encounter (Signed)
 Preadmission screen

## 2024-02-04 NOTE — Progress Notes (Signed)
 Pt presents for rob. Pt has no questions or concerns at this time.

## 2024-02-04 NOTE — MAU Note (Signed)
 Kristi [name] is a 39 y.o. at [redacted]w[redacted]d here in MAU reporting: had NST in office due to reaching 40 weeks. NST was nonreactive. Denies painful ctx, VB. Reports one small gush of fluid yesterday nothing since.    Pain score: 0/10 Vitals:   02/04/24 1310  BP: 117/71  Pulse: 89  Resp: 18  Temp: 98.5 F (36.9 C)  SpO2: 98%     FHT: 145  Lab orders placed from triage: UA

## 2024-02-04 NOTE — Progress Notes (Signed)
" ° °  PRENATAL VISIT NOTE  Subjective:  Kristi [name] is a 39 y.o. G3P2002 at [redacted]w[redacted]d being seen today for ongoing prenatal care.  She is currently monitored for the following issues for this low-risk pregnancy and has Rh negative state in antepartum period; Retained products of conception, following delivery with hemorrhage; Umbilical hernia without obstruction and without gangrene; Supervision of other normal pregnancy, antepartum; Advanced maternal age in multigravida; History of postpartum hemorrhage, currently pregnant; and Anemia of pregnancy on their problem list.  Patient doing well with no acute concerns today. She reports occasional contractions.  Contractions: Irritability. Vag. Bleeding: None.  Movement: Present. Denies leaking of fluid.   The following portions of the patient's history were reviewed and updated as appropriate: allergies, current medications, past family history, past medical history, past social history, past surgical history and problem list. Problem list updated.  Objective:   Vitals:   02/04/24 1130  BP: 123/66  Pulse: 88  Weight: 220 lb 8 oz (100 kg)    Fetal Status: Fetal Heart Rate (bpm): 141   Movement: Present     General:  Alert, oriented and cooperative. Patient is in no acute distress.  Skin: Skin is warm and dry. No rash noted.   Cardiovascular: Normal heart rate noted  Respiratory: Normal respiratory effort, no problems with respiration noted  Abdomen: Soft, gravid, appropriate for gestational age.  Pain/Pressure: Present     Pelvic: Cervical exam deferred        Extremities: Normal range of motion.  Edema: Trace  Mental Status:  Normal mood and affect. Normal behavior. Normal judgment and thought content.   Assessment and Plan:  Pregnancy: G3P2002 at [redacted]w[redacted]d  1. [redacted] weeks gestation of pregnancy (Primary)   2. Umbilical hernia without obstruction and without gangrene   3. Supervision of other normal pregnancy, antepartum Continue routine  prenatal care IOL scheduled for 02/09/24  4. Rh negative state in antepartum period Rhogam post delivery  5. History of postpartum hemorrhage, currently pregnant Be aware at time of delivery Consider TXA  6. Multigravida of advanced maternal age in third trimester   7. Anemia during pregnancy in third trimester   8. Post term pregnancy over 40 weeks NST today reassuring but not reactive Pt sent to MAU for further testing, possible BPP Pt aware if testing is not appropriate, induction may start today - Fetal nonstress test  Term labor symptoms and general obstetric precautions including but not limited to vaginal bleeding, contractions, leaking of fluid and fetal movement were reviewed in detail with the patient.  Please refer to After Visit Summary for other counseling recommendations.   No follow-ups on file.   Jerilynn Buddle, MD Faculty Attending Center for [facility]   "

## 2024-02-09 ENCOUNTER — Inpatient Hospital Stay (HOSPITAL_COMMUNITY)

## 2024-02-10 ENCOUNTER — Inpatient Hospital Stay (HOSPITAL_COMMUNITY): Admitting: Anesthesiology

## 2024-02-10 ENCOUNTER — Encounter (HOSPITAL_COMMUNITY): Payer: Self-pay | Admitting: Obstetrics and Gynecology

## 2024-02-10 ENCOUNTER — Other Ambulatory Visit: Payer: Self-pay

## 2024-02-10 ENCOUNTER — Inpatient Hospital Stay (HOSPITAL_COMMUNITY)
Admission: RE | Admit: 2024-02-10 | Discharge: 2024-02-13 | DRG: 951 | Disposition: A | Discharge status: Home / Self Care | Attending: Obstetrics and Gynecology | Admitting: Obstetrics and Gynecology

## 2024-02-10 DIAGNOSIS — Z7982 Long term (current) use of aspirin: Secondary | ICD-10-CM | POA: Diagnosis not present

## 2024-02-10 DIAGNOSIS — O26893 Other specified pregnancy related conditions, third trimester: Secondary | ICD-10-CM | POA: Diagnosis present

## 2024-02-10 DIAGNOSIS — Z833 Family history of diabetes mellitus: Secondary | ICD-10-CM | POA: Diagnosis not present

## 2024-02-10 DIAGNOSIS — O480 Post-term pregnancy: Principal | ICD-10-CM | POA: Diagnosis present

## 2024-02-10 DIAGNOSIS — Z6791 Unspecified blood type, Rh negative: Secondary | ICD-10-CM | POA: Diagnosis not present

## 2024-02-10 DIAGNOSIS — Z8249 Family history of ischemic heart disease and other diseases of the circulatory system: Secondary | ICD-10-CM | POA: Diagnosis not present

## 2024-02-10 DIAGNOSIS — Z3A40 40 weeks gestation of pregnancy: Secondary | ICD-10-CM

## 2024-02-10 DIAGNOSIS — Z3A41 41 weeks gestation of pregnancy: Secondary | ICD-10-CM | POA: Diagnosis not present

## 2024-02-10 LAB — COMPREHENSIVE METABOLIC PANEL WITH GFR
ALT: 15 U/L (ref 0–44)
AST: 19 U/L (ref 15–41)
Albumin: 3.4 g/dL — ABNORMAL LOW (ref 3.5–5.0)
Alkaline Phosphatase: 160 U/L — ABNORMAL HIGH (ref 38–126)
Anion gap: 9 (ref 5–15)
BUN: 5 mg/dL — ABNORMAL LOW (ref 6–20)
CO2: 24 mmol/L (ref 22–32)
Calcium: 9.1 mg/dL (ref 8.9–10.3)
Chloride: 105 mmol/L (ref 98–111)
Creatinine, Ser: 0.47 mg/dL (ref 0.44–1.00)
GFR, Estimated: >60 mL/min
Glucose, Bld: 83 mg/dL (ref 70–99)
Potassium: 3.6 mmol/L (ref 3.5–5.1)
Sodium: 138 mmol/L (ref 135–145)
Total Bilirubin: 0.7 mg/dL (ref 0.0–1.2)
Total Protein: 6.4 g/dL — ABNORMAL LOW (ref 6.5–8.1)

## 2024-02-10 LAB — CBC
HCT: 33 % — ABNORMAL LOW (ref 36.0–46.0)
Hemoglobin: 11.1 g/dL — ABNORMAL LOW (ref 12.0–15.0)
MCH: 31.4 pg (ref 26.0–34.0)
MCHC: 33.6 g/dL (ref 30.0–36.0)
MCV: 93.2 fL (ref 80.0–100.0)
Platelets: 133 K/uL — ABNORMAL LOW (ref 150–400)
RBC: 3.54 MIL/uL — ABNORMAL LOW (ref 3.87–5.11)
RDW: 14.9 % (ref 11.5–15.5)
WBC: 6 K/uL (ref 4.0–10.5)
nRBC: 0.5 % — ABNORMAL HIGH (ref 0.0–0.2)

## 2024-02-10 LAB — SYPHILIS: RPR W/REFLEX TO RPR TITER AND TREPONEMAL ANTIBODIES, TRADITIONAL SCREENING AND DIAGNOSIS ALGORITHM: RPR Ser Ql: NONREACTIVE

## 2024-02-10 MED ORDER — PHENYLEPHRINE 80 MCG/ML (10ML) SYRINGE FOR IV PUSH (FOR BLOOD PRESSURE SUPPORT): 80.0000 ug | PREFILLED_SYRINGE | INTRAVENOUS | Status: DC | PRN

## 2024-02-10 MED ORDER — OXYTOCIN-SODIUM CHLORIDE 30-0.9 UT/500ML-% IV SOLN
1.0000 m[IU]/min | INTRAVENOUS | Status: DC
  Administered 2024-02-10: 2 m[IU]/min via INTRAVENOUS
  Filled 2024-02-10: qty 500

## 2024-02-10 MED ORDER — LACTATED RINGERS IV SOLN: INTRAVENOUS | Status: DC

## 2024-02-10 MED ORDER — LIDOCAINE HCL (PF) 1 % IJ SOLN: 30.0000 mL | INTRAMUSCULAR | Status: DC | PRN

## 2024-02-10 MED ORDER — LIDOCAINE-EPINEPHRINE (PF) 1.5 %-1:200000 IJ SOLN
INTRAMUSCULAR | Status: DC | PRN
  Administered 2024-02-10: 5 mL via EPIDURAL

## 2024-02-10 MED ORDER — EPHEDRINE 5 MG/ML INJ: 10.0000 mg | INTRAVENOUS | Status: DC | PRN

## 2024-02-10 MED ORDER — TERBUTALINE SULFATE 1 MG/ML IJ SOLN
0.2500 mg | Freq: Once | INTRAMUSCULAR | Status: AC | PRN
  Administered 2024-02-10: 0.25 mg via SUBCUTANEOUS
  Filled 2024-02-10: qty 1

## 2024-02-10 MED ORDER — SOD CITRATE-CITRIC ACID 500-334 MG/5ML PO SOLN: 30.0000 mL | ORAL | Status: DC | PRN

## 2024-02-10 MED ORDER — LACTATED RINGERS IV SOLN: 500.0000 mL | INTRAVENOUS | Status: DC | PRN

## 2024-02-10 MED ORDER — MISOPROSTOL 25 MCG QUARTER TABLET
25.0000 ug | ORAL_TABLET | Freq: Once | ORAL | Status: AC
  Administered 2024-02-10: 25 ug via VAGINAL
  Filled 2024-02-10: qty 1

## 2024-02-10 MED ORDER — ONDANSETRON HCL 4 MG/2ML IJ SOLN: 4.0000 mg | Freq: Four times a day (QID) | INTRAMUSCULAR | Status: DC | PRN

## 2024-02-10 MED ORDER — FENTANYL CITRATE (PF) 100 MCG/2ML IJ SOLN: 50.0000 ug | INTRAMUSCULAR | Status: DC | PRN

## 2024-02-10 MED ORDER — OXYCODONE-ACETAMINOPHEN 5-325 MG PO TABS: 2.0000 | ORAL_TABLET | ORAL | Status: DC | PRN

## 2024-02-10 MED ORDER — TERBUTALINE SULFATE 1 MG/ML IJ SOLN: 0.2500 mg | Freq: Once | INTRAMUSCULAR | Status: DC | PRN

## 2024-02-10 MED ORDER — FENTANYL-BUPIVACAINE-NACL 0.5-0.125-0.9 MG/250ML-% EP SOLN
12.0000 mL/h | EPIDURAL | Status: DC | PRN
  Administered 2024-02-10: 12 mL/h via EPIDURAL
  Filled 2024-02-10: qty 250

## 2024-02-10 MED ORDER — MISOPROSTOL 50MCG HALF TABLET
50.0000 ug | ORAL_TABLET | Freq: Once | ORAL | Status: AC
  Administered 2024-02-10: 50 ug via ORAL
  Filled 2024-02-10: qty 1

## 2024-02-10 MED ORDER — LACTATED RINGERS IV SOLN
500.0000 mL | Freq: Once | INTRAVENOUS | Status: AC
  Administered 2024-02-10: 500 mL via INTRAVENOUS

## 2024-02-10 MED ORDER — LIDOCAINE HCL (PF) 1 % IJ SOLN
INTRAMUSCULAR | Status: DC | PRN
  Administered 2024-02-10: 5 mL via EPIDURAL

## 2024-02-10 MED ORDER — ACETAMINOPHEN 325 MG PO TABS: 650.0000 mg | ORAL_TABLET | ORAL | Status: DC | PRN

## 2024-02-10 MED ORDER — OXYTOCIN BOLUS FROM INFUSION
333.0000 mL | Freq: Once | INTRAVENOUS | Status: AC
  Administered 2024-02-11: 333 mL via INTRAVENOUS

## 2024-02-10 MED ORDER — DIPHENHYDRAMINE HCL 50 MG/ML IJ SOLN: 12.5000 mg | INTRAMUSCULAR | Status: DC | PRN

## 2024-02-10 MED ORDER — OXYCODONE-ACETAMINOPHEN 5-325 MG PO TABS: 1.0000 | ORAL_TABLET | ORAL | Status: DC | PRN

## 2024-02-10 MED ORDER — OXYTOCIN-SODIUM CHLORIDE 30-0.9 UT/500ML-% IV SOLN
2.5000 [IU]/h | INTRAVENOUS | Status: DC
  Administered 2024-02-11: 2.5 [IU]/h via INTRAVENOUS

## 2024-02-10 NOTE — Progress Notes (Cosign Needed)
 Labor Progress Note Kristi [name] is a 39 y.o. G3P2002 at [redacted]w[redacted]d presented for PD IOL S:   O:  BP 118/81   Pulse 76   Temp 97.9 F (36.6 C) (Oral)   Resp 17   Ht 5' 5 (1.651 m)   Wt 99.6 kg   LMP 04/30/2023 (Exact Date)   BMI 36.53 kg/m   CVE: Dilation: 6 Effacement (%): 70, 60 Station: -3 Presentation: Vertex Exam by:: Doyal [place], rnc   A&P: 39 y.o. [id number] [redacted]w[redacted]d IOL  #Labor: AROM w/ Dr Ladene at 2130. Marginal cervical change, pitocin  now at 8, continue to titrate w/ consideration for fetal tolerance. #Pain: now s/p epidural placement #FWB: Baseline 145, moderate variability, occasional late decels w/ spontaneous recovery to baseline.  #GBS negative  Leafy Scriver, DO 11:45 PM   Attestation of Supervision of Student:  I confirm that I have verified the information documented in the resident's note and that I have also personally performed the history, physical exam and all medical decision making activities.  I have verified that all services and findings are accurately documented in this student's note; and I agree with management and plan as outlined in the documentation. I have also made any necessary editorial changes.    Barkley LITTIE Angles, MD OB Fellow 02/11/2024 2:14 AM

## 2024-02-10 NOTE — H&P (Signed)
 OBSTETRIC ADMISSION HISTORY AND PHYSICAL  Kristi [name] is a 39 y.o. female G3P2002 with IUP at [redacted]w[redacted]d (dated by LMP, Estimated Date of Delivery: 02/04/24) presenting for PD IOL.   She reports +FMs, No LOF, no VB, no blurry vision, headaches or peripheral edema, and RUQ pain.    She plans on breast and formula feeding. She request postplacental IUD for birth control.  She received her prenatal care at [facility]   Prenatal History/Complications: N/A  Past Medical History: Past Medical History:  Diagnosis Date   History of postpartum hemorrhage    Pap smear abnormality of cervix     Past Surgical History: Past Surgical History:  Procedure Laterality Date   COLPOSCOPY  2013-2014   DILATION AND CURETTAGE OF UTERUS     DILATION AND EVACUATION N/A 07/20/2017   Procedure: DILATATION AND EVACUATION;  Surgeon: Herchel Gloris LABOR, MD;  Location: [facility];  Service: Gynecology;  Laterality: N/A;   SIGMOIDOSCOPY      Obstetrical History: OB History     Gravida  3   Para  2   Term  2   Preterm  0   AB  0   Living  2      SAB  0   IAB  0   Ectopic  0   Multiple  0   Live Births  2        Obstetric Comments  D&E on 07/20/17 (8 weeks PP) for RPOCs         Social History Social History   Socioeconomic History   Marital status: Single    Spouse name: Not on file   Number of children: 2   Years of education: Not on file   Highest education level: Not on file  Occupational History   Occupation: Nurse  Tobacco Use   Smoking status: Never   Smokeless tobacco: Never  Vaping Use   Vaping status: Never Used  Substance and Sexual Activity   Alcohol use: Not Currently    Comment: social   Drug use: No   Sexual activity: Yes    Partners: Male    Birth control/protection: None    Comment: Pregnant   Other Topics Concern   Not on file  Social History Narrative   Not on file   Social Drivers of Health   Tobacco Use: Low Risk (02/10/2024)   Patient History     Smoking Tobacco Use: Never    Smokeless Tobacco Use: Never    Passive Exposure: Not on file  Financial Resource Strain: Not on file  Food Insecurity: No Food Insecurity (02/10/2024)   Epic    Worried About Programme Researcher, Broadcasting/film/video in the Last Year: Never true    Ran Out of Food in the Last Year: Never true  Transportation Needs: No Transportation Needs (02/10/2024)   Epic    Lack of Transportation (Medical): No    Lack of Transportation (Non-Medical): No  Physical Activity: Not on file  Stress: Not on file  Social Connections: Not on file  Depression (EYV7-0): Low Risk (01/14/2024)   Depression (PHQ2-9)    PHQ-2 Score: 0  Alcohol Screen: Not on file  Housing: Low Risk (02/10/2024)   Epic    Unable to Pay for Housing in the Last Year: No    Number of Times Moved in the Last Year: 0    Homeless in the Last Year: No  Utilities: Not At Risk (02/10/2024)   Epic    Threatened with loss of  utilities: No  Health Literacy: Not on file    Family History: Family History  Problem Relation Age of Onset   Hypertension Mother    Stroke Mother        tia   Cancer - Colon Mother        Stage 0 with colon resection   Hypertension Father    Colon cancer Maternal Grandfather    Ovarian cancer Paternal Grandmother    Diabetes Paternal Grandmother     Allergies: Allergies[1]  Medications Prior to Admission  Medication Sig Dispense Refill Last Dose/Taking   acetaminophen  (TYLENOL ) 325 MG tablet Take 650 mg by mouth every 6 (six) hours as needed for mild pain (pain score 1-3).   Past Week   aspirin  81 MG chewable tablet Chew 1 tablet (81 mg total) by mouth daily. 90 tablet 3 02/09/2024   ferrous gluconate  (FERGON) 324 MG tablet Take 1 tablet (324 mg total) by mouth every other day. 30 tablet 3 02/09/2024   Prenatal Vit-Fe Fumarate-FA (PRENATAL PO) Take 1 tablet by mouth daily.   02/09/2024   docusate sodium  (COLACE) 50 MG capsule Take 50 mg by mouth 2 (two) times daily.      Evening Primrose Oil  1000 MG CAPS Take 1 capsule by mouth once.        Review of Systems  All systems reviewed and negative except as stated in HPI.  Blood pressure 113/67, pulse 76, temperature 98.1 F (36.7 C), temperature source Oral, resp. rate 16, height 5' 5 (1.651 m), weight 99.6 kg, last menstrual period 04/30/2023. General appearance: alert, cooperative, and appears stated age Lungs: breathing comfortably on room air Heart: regular rate Abdomen: soft, non-tender; gravid Extremities: no edema of bilateral lower extremities DTR's intact Presentation: cephalic Fetal monitoringBaseline: 140 bpm, Variability: Good {> 6 bpm), Accelerations: absent, and Decelerations: Absent Uterine activityNone     Prenatal labs: ABO, Rh: O/Negative/-- (07/01 1132) Antibody: Positive, See Final Results (07/01 1132) Rubella: 2.51 (07/01 1132) RPR: Non Reactive (09/29 0830)  HBsAg: Negative (07/01 1132)  HIV: Non Reactive (09/29 0830)  GBS: Negative/-- (12/09 1640)  2 hr Glucola normal Genetic screening  LR female Anatomy US  Appears normal Last US : At [redacted]w[redacted]d - cephalic presentation, EFW 2956g (32 %tile), AC 40%  Prenatal Transfer Tool  Maternal Diabetes: No Genetic Screening: Normal Maternal Ultrasounds/Referrals: Normal Fetal Ultrasounds or other Referrals:  None Maternal Substance Abuse:  No Significant Maternal Medications:  None Significant Maternal Lab Results:  Group B Strep negative and Rh negative Number of Prenatal Visits:greater than 3 verified prenatal visits Other Comments:  None  No results found for this or any previous visit (from the past 24 hours).  Patient Active Problem List   Diagnosis Date Noted   Indication for care or intervention related to labor and delivery 02/10/2024   Anemia of pregnancy 11/20/2023   Advanced maternal age in multigravida 07/31/2023   History of postpartum hemorrhage, currently pregnant 07/31/2023   Supervision of other normal pregnancy, antepartum 06/27/2023    Umbilical hernia without obstruction and without gangrene 11/16/2020   Retained products of conception, following delivery with hemorrhage 07/20/2017   Rh negative state in antepartum period 11/13/2016    Assessment/Plan:  Rosmarie Esquibel is a 39 y.o. G3P2002 at [redacted]w[redacted]d here for PD IOL  #Labor: IOL process explained in detail to the patient, will start with dual cytotec . #Pain: Per patient request, planning epidural #FWB: Cat I #ID:  GBS (-) #MOF:  Breast and Formula #MOC: Considering ppIUD #Circ:  Yes   Barkley Angles, MD OB Fellow, Faculty [facility], Center for [facility] 02/10/2024 6:41 AM        [1]  Allergies Allergen Reactions   Adhesive [Tape] Rash

## 2024-02-10 NOTE — Progress Notes (Signed)
 LABOR PROGRESS NOTE Pt rechecked at 1115 Patient comfortable without epidural. Pit at none. SCE: 2/50/-3 FB placed  FHT: baseline 150, mod variability, +accels, -decels; overall category I Toco: 2x in  A/P:  FB placed No miso at this time, reactive tracing however baby did not tolerate Dmiso this morning and required terb. Monitor and reassess.  Barabara Maier, DO 11:42 AM

## 2024-02-10 NOTE — Anesthesia Procedure Notes (Signed)
 Epidural Patient location during procedure: OB Start time: 02/10/2024 10:23 PM End time: 02/10/2024 10:33 PM  Staffing Anesthesiologist: Patrisha Bernardino SQUIBB, MD Performed: anesthesiologist   Preanesthetic Checklist Completed: patient identified, IV checked, site marked, risks and benefits discussed, monitors and equipment checked, pre-op evaluation and timeout performed  Epidural Patient position: sitting Prep: DuraPrep Patient monitoring: heart rate, cardiac monitor, continuous pulse ox and blood pressure Approach: midline Location: L3-L4 Injection technique: LOR air  Needle:  Needle type: Tuohy  Needle gauge: 17 G Needle length: 9 cm Needle insertion depth: 7 cm Catheter type: closed end flexible Catheter size: 19 Gauge Catheter at skin depth: 12 cm Test dose: negative and 1.5% lidocaine  with Epi 1:200 K  Assessment Events: blood not aspirated, no cerebrospinal fluid, injection not painful, no injection resistance and negative IV test  Additional Notes Informed consent obtained prior to proceeding including risk of failure, 1% risk of PDPH, risk of minor discomfort and bruising. Discussed alternatives to epidural analgesia and patient desires to proceed.  Timeout performed pre-procedure verifying patient name, procedure, and platelet count.  Patient tolerated procedure well. Reason for block:procedure for pain

## 2024-02-10 NOTE — Anesthesia Preprocedure Evaluation (Signed)
"                                    Anesthesia Evaluation  Patient identified by MRN, date of birth, ID band Patient awake    Reviewed: Allergy & Precautions, H&P , NPO status , Patient's Chart, lab work & pertinent test results  History of Anesthesia Complications Negative for: history of anesthetic complications  Airway Mallampati: II       Dental no notable dental hx.    Pulmonary neg pulmonary ROS   Pulmonary exam normal        Cardiovascular negative cardio ROS Normal cardiovascular exam     Neuro/Psych negative neurological ROS  negative psych ROS   GI/Hepatic negative GI ROS, Neg liver ROS,,,  Endo/Other  negative endocrine ROS    Renal/GU negative Renal ROS     Musculoskeletal   Abdominal  (+) + obese  Peds  Hematology  (+) Blood dyscrasia, anemia PLT: 133   Anesthesia Other Findings   Reproductive/Obstetrics (+) Pregnancy                              Anesthesia Physical Anesthesia Plan  ASA: 2  Anesthesia Plan: Epidural   Post-op Pain Management:    Induction:   PONV Risk Score and Plan:   Airway Management Planned:   Additional Equipment:   Intra-op Plan:   Post-operative Plan:   Informed Consent: I have reviewed the patients History and Physical, chart, labs and discussed the procedure including the risks, benefits and alternatives for the proposed anesthesia with the patient or authorized representative who has indicated his/her understanding and acceptance.       Plan Discussed with:   Anesthesia Plan Comments:         Anesthesia Quick Evaluation  "

## 2024-02-10 NOTE — Progress Notes (Addendum)
 LABOR PROGRESS NOTE Pt rechecked at 1840. FB came out and started pit at 1345. Patient comfortable without epidural. Pit at 4. SCE: 5-6cm/70/-3 AROM attempted, no output  FHT: baseline 150, mod variability, +accels, late decels; overall category II Toco: q2-4 min  A/P:  Continue to uptitrate pitocin  as tolerated Assess AROM again next check  Barabara Maier, DO 6:41 PM

## 2024-02-11 ENCOUNTER — Inpatient Hospital Stay (HOSPITAL_COMMUNITY)

## 2024-02-11 ENCOUNTER — Encounter (HOSPITAL_COMMUNITY): Payer: Self-pay | Admitting: Obstetrics and Gynecology

## 2024-02-11 DIAGNOSIS — O480 Post-term pregnancy: Secondary | ICD-10-CM

## 2024-02-11 DIAGNOSIS — Z3A41 41 weeks gestation of pregnancy: Secondary | ICD-10-CM

## 2024-02-11 MED ORDER — SENNOSIDES-DOCUSATE SODIUM 8.6-50 MG PO TABS
2.0000 | ORAL_TABLET | Freq: Every day | ORAL | Status: DC
  Administered 2024-02-12 – 2024-02-13 (×2): 2 via ORAL
  Filled 2024-02-11 (×2): qty 2

## 2024-02-11 MED ORDER — PRENATAL MULTIVITAMIN CH
1.0000 | ORAL_TABLET | Freq: Every day | ORAL | Status: DC
  Administered 2024-02-11 – 2024-02-13 (×3): 1 via ORAL
  Filled 2024-02-11 (×3): qty 1

## 2024-02-11 MED ORDER — ONDANSETRON HCL 4 MG/2ML IJ SOLN: 4.0000 mg | INTRAMUSCULAR | Status: DC | PRN

## 2024-02-11 MED ORDER — COCONUT OIL OIL
1.0000 | TOPICAL_OIL | Status: DC | PRN
  Administered 2024-02-11 – 2024-02-13 (×2): 1 via TOPICAL

## 2024-02-11 MED ORDER — IBUPROFEN 600 MG PO TABS
600.0000 mg | ORAL_TABLET | Freq: Four times a day (QID) | ORAL | Status: DC
  Administered 2024-02-11 – 2024-02-13 (×11): 600 mg via ORAL
  Filled 2024-02-11 (×11): qty 1

## 2024-02-11 MED ORDER — ACETAMINOPHEN 325 MG PO TABS
650.0000 mg | ORAL_TABLET | ORAL | Status: DC | PRN
  Administered 2024-02-11: 650 mg via ORAL
  Filled 2024-02-11: qty 2

## 2024-02-11 MED ORDER — BENZOCAINE-MENTHOL 20-0.5 % EX AERO
1.0000 | INHALATION_SPRAY | CUTANEOUS | Status: DC | PRN
  Administered 2024-02-11: 1 via TOPICAL
  Filled 2024-02-11: qty 56

## 2024-02-11 MED ORDER — DIPHENHYDRAMINE HCL 25 MG PO CAPS: 25.0000 mg | ORAL_CAPSULE | Freq: Four times a day (QID) | ORAL | Status: DC | PRN

## 2024-02-11 MED ORDER — SIMETHICONE 80 MG PO CHEW: 80.0000 mg | CHEWABLE_TABLET | ORAL | Status: DC | PRN

## 2024-02-11 MED ORDER — DIBUCAINE (PERIANAL) 1 % EX OINT
1.0000 | TOPICAL_OINTMENT | CUTANEOUS | Status: DC | PRN
  Administered 2024-02-11: 1 via RECTAL
  Filled 2024-02-11: qty 28

## 2024-02-11 MED ORDER — BUPIVACAINE HCL (PF) 0.25 % IJ SOLN
INTRAMUSCULAR | Status: DC | PRN
  Administered 2024-02-11 (×2): 5 mL via EPIDURAL

## 2024-02-11 MED ORDER — WITCH HAZEL-GLYCERIN EX PADS: 1.0000 | MEDICATED_PAD | CUTANEOUS | Status: DC | PRN

## 2024-02-11 MED ORDER — RHO D IMMUNE GLOBULIN 1500 UNIT/2ML IJ SOSY
300.0000 ug | PREFILLED_SYRINGE | Freq: Once | INTRAMUSCULAR | Status: AC
  Administered 2024-02-11: 300 ug via INTRAVENOUS
  Filled 2024-02-11: qty 2

## 2024-02-11 MED ORDER — ONDANSETRON HCL 4 MG PO TABS: 4.0000 mg | ORAL_TABLET | ORAL | Status: DC | PRN

## 2024-02-11 NOTE — Lactation Note (Signed)
 This note was copied from a baby's chart. Lactation Consultation Note  Patient Name: Kristi [name] Date: 02/11/2024 Age:38 hours Reason for consult: Follow-up assessment;MD order;Term  P3- MD requested for LC to access infant's next latch to ensure the belly breathing had gotten better with the saline drops. MOB had already latched infant when [facility] entered the room. MOB had her unlatch infant and relatch so LC could watch. MOB placed infant on the left breast in the football hold. Infant was eager and latched with first attempt. Infant had a deep latch with flanged lips and a strong rhythmic pull. Infant still sounded stuffy in the nose, but it was much better than the last feeding. LC noted very minimal belly breathing. This was again much better than infant's last feeding. LC praised MOB and reminded her to latch infant on the right breast as well, not just the left. LC encouraged MOB to call for further assistance as needed.  Maternal Data Has patient been taught Hand Expression?: Yes Does the patient have breastfeeding experience prior to this delivery?: Yes  Feeding Mother's Current Feeding Choice: Breast Milk  LATCH Score Latch: Grasps breast easily, tongue down, lips flanged, rhythmical sucking.  Audible Swallowing: Spontaneous and intermittent  Type of Nipple: Everted at rest and after stimulation  Comfort (Breast/Nipple): Soft / non-tender  Hold (Positioning): No assistance needed to correctly position infant at breast.  LATCH Score: 10   Lactation Tools Discussed/Used Tools: Pump;Flanges  Interventions Interventions: Breast feeding basics reviewed;Education;LC Services brochure  Discharge Discharge Education: Engorgement and breast care;Warning signs for feeding baby Pump: DEBP;Manual;Personal;Advised to call insurance company  Consult Status Consult Status: Follow-up Date: 02/12/24 Follow-up type: In-patient    Recardo Hoit BS, IBCLC 02/11/2024, 9:32  PM

## 2024-02-11 NOTE — Anesthesia Postprocedure Evaluation (Signed)
"   Anesthesia Post Note  Patient: Kristi [name]  Procedure(s) Performed: AN AD HOC LABOR EPIDURAL     Patient location during evaluation: Mother Baby Anesthesia Type: Epidural Level of consciousness: awake and alert Pain management: pain level controlled Vital Signs Assessment: post-procedure vital signs reviewed and stable Respiratory status: spontaneous breathing, nonlabored ventilation and respiratory function stable Cardiovascular status: stable Postop Assessment: no headache and epidural receding Anesthetic complications: no   No notable events documented.  Last Vitals:  Vitals:   02/11/24 0729 02/11/24 1044  BP: 93/65 106/66  Pulse: 80 60  Resp: 18 18  Temp: 36.9 C 36.8 C  SpO2: 99% 100%    Last Pain:  Vitals:   02/11/24 1304  TempSrc:   PainSc: 0-No pain   Pain Goal:                   [name]      "

## 2024-02-11 NOTE — Lactation Note (Signed)
 This note was copied from a baby's chart. Lactation Consultation Note  Patient Name: Kristi [name] Date: 02/11/2024 Age:38 hours Reason for consult: Initial assessment;Term;Breastfeeding assistance  P3- MOB would like to breast feed only, but is open to supplementing if needed. MOB reports that she has no milk. With permission, LC demonstrated how to hand express to show MOB her colostrum. A glistening was noted. MOB requested to have the hospital DEBP to be set up so she could post pump after latches for further stimulation. LC set up the DEBP with 18 mm flanges. Instead of pumping, we latched infant on the left breast in the football hold. Infant was too excited and started sucking his upper lip instead of opening his mouth. LC reviewed how to stroke her nipple from from nose to chin to elicit a gaping mouth. Infant opened after a few attempts and latch deep with flanged lips. Infant had a strong rhythmic pull. Infant was still nursing when [facility] left the room.  LC reviewed the first 24 hr birthday nap, day 2 cluster feeding, feeding infant on cue 8-12x in 24 hrs, not allowing infant to go over 3 hrs without a feeding, CDC milk storage guidelines, LC services handout and engorgement/breast care. LC encouraged MOB to call for further assistance as needed.  Maternal Data Has patient been taught Hand Expression?: Yes Does the patient have breastfeeding experience prior to this delivery?: Yes How long did the patient breastfeed?: 1 month, but struggled with supply  Feeding Mother's Current Feeding Choice: Breast Milk  LATCH Score Latch: Grasps breast easily, tongue down, lips flanged, rhythmical sucking.  Audible Swallowing: A few with stimulation  Type of Nipple: Everted at rest and after stimulation  Comfort (Breast/Nipple): Soft / non-tender  Hold (Positioning): Assistance needed to correctly position infant at breast and maintain latch.  LATCH Score: 8   Lactation Tools  Discussed/Used Tools: Pump;Flanges Flange Size: 18 Breast pump type: Double-Electric Breast Pump;Manual Pump Education: Setup, frequency, and cleaning;Milk Storage Reason for Pumping: MOB request Pumping frequency: 15-20 min every 3 hrs  Interventions Interventions: Breast feeding basics reviewed;Assisted with latch;Hand express;Breast compression;Adjust position;Support pillows;Position options;Hand pump;DEBP;Education;LC Services brochure  Discharge Discharge Education: Engorgement and breast care;Warning signs for feeding baby Pump: Manual;DEBP;Personal;Advised to call insurance company  Consult Status Consult Status: Follow-up Date: 02/12/24 Follow-up type: In-patient    Recardo Hoit BS, IBCLC 02/11/2024, 5:07 PM

## 2024-02-11 NOTE — Discharge Summary (Signed)
 "    Postpartum Discharge Summary  Date of Service updated***     Patient Name: Kristi [name] DOB: 07/14/1985 MRN: [id number]  Date of admission: 02/10/2024 Delivery date:02/11/2024 Delivering provider: CASHION, COLTER L Date of discharge: 02/11/2024  Admitting diagnosis: Indication for care or intervention related to labor and delivery [O75.9] Intrauterine pregnancy: [redacted]w[redacted]d     Secondary diagnosis:  Principal Problem:   Indication for care or intervention related to labor and delivery  Additional problems: ***    Discharge diagnosis: Term Pregnancy Delivered                                              Post partum procedures:{Postpartum procedures:23558} Augmentation: AROM, Pitocin , and Cytotec  Complications: None  Hospital course: Induction of Labor With Vaginal Delivery   39 y.o. yo G3P2002 at [redacted]w[redacted]d was admitted to the hospital 02/10/2024 for induction of labor.  Indication for induction: Postdates.  Her induction and labor course required terbutaline  x 1 after poor fetal response to cytotec  but was otherwise uncomplicated.  Membrane Rupture Time/Date: 6:30 PM,02/10/2024  Delivery Method:Vaginal, Spontaneous Operative Delivery:N/A Episiotomy: None Lacerations:  None Details of delivery can be found in separate delivery note.  Patient had a postpartum course complicated by***. Patient is discharged home 02/11/2024.  Newborn Data: Birth date:02/11/2024 Birth time:3:13 AM Gender:Female Living status:Living Apgars:9 ,9  Weight:   Magnesium Sulfate received: No BMZ received: No Rhophylac :No MMR:No T-DaP:Given prenatally Flu: Yes RSV Vaccine received: No Transfusion:{Transfusion received:30440034}  Immunizations received: Immunization History  Administered Date(s) Administered   Influenza, Seasonal, Injecte, Preservative Fre 11/27/2023   PPD Test 06/25/2012   Tdap 03/30/2017, 08/30/2020, 11/13/2023   Varicella 07/15/2012, 08/15/2012    Physical exam  Vitals:   02/11/24  0112 02/11/24 0130 02/11/24 0200 02/11/24 0313  BP: 113/67 127/71 (!) 111/58 104/80  Pulse: 66 (!) 57 67 (!) 119  Resp:      Temp:      TempSrc:      SpO2:    100%  Weight:      Height:       General: {Exam; general:21111117} Lochia: {Desc; appropriate/inappropriate:30686::appropriate} Uterine Fundus: {Desc; firm/soft:30687} Incision: {Exam; incision:21111123} DVT Evaluation: {Exam; dvt:2111122} Labs: Lab Results  Component Value Date   WBC 6.0 02/10/2024   HGB 11.1 (L) 02/10/2024   HCT 33.0 (L) 02/10/2024   MCV 93.2 02/10/2024   PLT 133 (L) 02/10/2024      Latest Ref Rng & Units 02/10/2024    6:27 AM  CMP  Glucose 70 - 99 mg/dL 83   BUN 6 - 20 mg/dL 5   Creatinine 9.55 - 8.99 mg/dL 9.52   Sodium 864 - 854 mmol/L 138   Potassium 3.5 - 5.1 mmol/L 3.6   Chloride 98 - 111 mmol/L 105   CO2 22 - 32 mmol/L 24   Calcium 8.9 - 10.3 mg/dL 9.1   Total Protein 6.5 - 8.1 g/dL 6.4   Total Bilirubin 0.0 - 1.2 mg/dL 0.7   Alkaline Phos 38 - 126 U/L 160   AST 15 - 41 U/L 19   ALT 0 - 44 U/L 15    Edinburgh Score:    11/16/2020    9:44 AM  Edinburgh Postnatal Depression Scale Screening Tool  I have been able to laugh and see the funny side of things. 0   I have looked forward with enjoyment to things.  0   I have blamed myself unnecessarily when things went wrong. 0   I have been anxious or worried for no good reason. 0   I have felt scared or panicky for no good reason. 0   Things have been getting on top of me. 1   I have been so unhappy that I have had difficulty sleeping. 0   I have felt sad or miserable. 0   I have been so unhappy that I have been crying. 0   The thought of harming myself has occurred to me. 0   Edinburgh Postnatal Depression Scale Total 1      Data saved with a previous flowsheet row definition   No data recorded  After visit meds:  Allergies as of 02/11/2024       Reactions   Adhesive [tape] Rash     Med Rec must be completed prior to using  this [facility]***        Discharge home in stable condition Infant Feeding: Breast and formula Infant Disposition:{CHL IP OB HOME WITH FNUYZM:76418} Discharge instruction: per After Visit Summary and Postpartum booklet. Activity: Advance as tolerated. Pelvic rest for 6 weeks.  Diet: routine diet Future Appointments:No future appointments. Follow up Visit:  *** Please schedule this patient for a In person postpartum visit in 6 weeks with the following provider: Any provider. Additional Postpartum F/U:None  Low risk pregnancy complicated by: none Delivery mode:  Vaginal, Spontaneous Anticipated Birth Control:  {Birth Control:23956}   02/11/2024 Leafy Scriver, DO    "

## 2024-02-12 LAB — RH IG WORKUP (INCLUDES ABO/RH)
Fetal Screen: NEGATIVE
Gestational Age(Wks): 41
Unit division: 0

## 2024-02-12 NOTE — Patient Instructions (Signed)
 Your appointment with Outpatient Lactation is: Date: 02/20/2024 Time: 3:30 PM [facility] (First Floor) [address] [place]  Check in under baby's name.  Please bring your baby hungry along with your pump and a bottle of either formula or expressed breast milk. Please also bring your pump flanges and we welcome support people! If you need lactation assistance before your appointment, please call [phone] for lactation voice mail.   [facility] Lactation Support Group  Please join us  for our [facility] [employer] Lact[facility] Support Group at [employer] We meet every Tuesday at 10:00 am to 12:00 pm at [employer] on the second floor in the conference room Lactating parents and lap babies are welcome, no registration is required, if you have a lactation pillow please bring it with you. Call with any lactation questions or concerns at [phone].

## 2024-02-12 NOTE — Lactation Note (Signed)
 This note was copied from a baby's chart. Lactation Consultation Note  Patient Name: Kristi [name] Unijb'd Date: 02/12/2024 Age:38 hours  Reason for consult: Follow-up assessment;Breastfeeding assistance;Term  P1, [redacted]w[redacted]d, 3% weight loss  Follow up LC visit with mother and baby Kristi Josiah. Baby has had nasal stuffiness and respiratory evaluations today by pediatrician and Neonatologist. Baby is in mother's room, sleeping in crib, no signs of respiratory distress or nasal stuffiness. MD has OK'd baby to breast and bottle feed as tolerated.   Mother requesting breastfeeding assistance. Basic breastfeeding education with baby placed in football with pillows to align baby to breast and offer support. Baby latches with wide gape and quickly once brought to the breast. Prior to latch, hand expression was reviewed and a glistening of colostrum was noted. Baby was feeding well and nurse needed to assess baby. Mother planned to re-latch once baby was checked.  Mother reports history low milk supply with her previous babies. She states she formula supplemented a lot with the her first and had a PPH with readmission for D&C with her second. Mother has a pump set up at bedside. She did not express milk when pumping and did not continue. Advised mother to pump every 3 hrs for 15 min and reviewed the pump settings.   Discussed the process of milk production, supply and demand and the importance of breast stimulation and milk removal in order to make an optimal milk supply.  Discussed mother to breastfeed 8-12 times in 24 hours, skin to skin and breast feed before formula feeding.   If missed feedings at breast or substituting feeding with formula, advised to hand express and/or pump to remove milk from the breast.  Mother is supplementing breastfeeding with formula   Feeding Mother's Current Feeding Choice: Breast Milk and Formula  LATCH Score Latch: Grasps breast easily, tongue down, lips  flanged, rhythmical sucking.  Audible Swallowing: None (brief latch, RN came in to assess baby)  Type of Nipple: Everted at rest and after stimulation  Comfort (Breast/Nipple): Soft / non-tender  Hold (Positioning): Assistance needed to correctly position infant at breast and maintain latch.  LATCH Score: 7   Lactation Tools Discussed/Used Pumping frequency: encouraged to pump every 3 hrs for 15 minutes/ initiate setting  Interventions Interventions: Breast feeding basics reviewed;Assisted with latch;Skin to skin;Hand express;Breast compression;Adjust position;Support pillows;Position options;Education  Discharge Pump: Hands Free (Motif)  Consult Status Consult Status: Follow-up Date: 02/13/24 Follow-up type: In-patient    Joshua Line M 02/12/2024, 6:02 PM

## 2024-02-12 NOTE — Progress Notes (Signed)
 Post Partum Day #1  Subjective: Kristi [name] is a 39 y.o. G3P3003 s/p SVD at [redacted]w[redacted]d.  No acute events overnight.  Pt denies problems with ambulating, voiding or po intake.  She denies nausea or vomiting.  Pain is well controlled.  She has had flatus. She has had small bowel movement after delivery. Lochia Minimal.   Objective: Blood pressure 103/71, pulse 87, temperature 98.5 F (36.9 C), temperature source Oral, resp. rate 18, height 5' 5 (1.651 m), weight 99.6 kg, last menstrual period 04/30/2023, SpO2 98%, unknown if currently breastfeeding.  Physical Exam:  General: alert, cooperative and no distress Chest: no respiratory distress Heart: regular rate Abdomen: soft, nontender,  Uterine Fundus: firm, appropriately tender DVT Evaluation: No calf swelling or tenderness Extremities: Mild edema Skin: warm, dry  Recent Labs    02/10/24 0627  HGB 11.1*  HCT 33.0*   Assessment/Plan: Kristi [name] is a 39 y.o. [id number] s/p SVD at [redacted]w[redacted]d   PPD#1 - Doing well. Requested to stay another night since NB has been having feeding issues.   1. Continue routine postpartum care  2. Infant feeding status: breast feeding -Lactation consult PRN for breastfeeding   3. Contraception plan: Undecided   4. Acute blood loss anemia - clinically not significant .  -Hemodynamically stable and asymptomatic -Intervention: No intervention   Dispo: Plan for discharge tomorrow pending NB status .   LOS: 2 days   Bathsheba Durrett, CNM, CNM 02/12/2024, 10:10 AM

## 2024-02-13 MED ORDER — IBUPROFEN 600 MG PO TABS: 600.0000 mg | ORAL_TABLET | Freq: Four times a day (QID) | ORAL | 0 refills | Status: AC

## 2024-02-13 MED ORDER — RHO D IMMUNE GLOBULIN 1500 UNIT/2ML IJ SOSY
300.0000 ug | PREFILLED_SYRINGE | Freq: Once | INTRAMUSCULAR | Status: DC
  Filled 2024-02-13: qty 2

## 2024-02-13 NOTE — Lactation Note (Signed)
 This note was copied from a baby's chart. Lactation Consultation Note  Patient Name: Kristi [name] Date: 02/13/2024 Age:38 hours Reason for consult: Follow-up assessment;Term;Infant weight loss Per mom recently fed the baby at the breast and then supplement 10 ml.  Per mom concerned that she pumped with the DEBP and she had no results.  LC reassured mom that  it is normal for pumping to be a slow process.  LC reviewed the doc flow sheets and LC updated per mom the feedings.  LC recommended since there as only been 4 % weight loss if to offer the 1st breast,  and if baby is still hungry offer the 2nd breast. If the baby is satisfied hold off on the formula.  Reviewed 24 hour breast feeding goals of feeding with cues and by 3 hours offer the breast. If baby is to fussy to latch  May need to feed and appetizer of EBM or formula since he is use to the quicker flow.  LC reviewed breast feeding D/C teaching and reminded mom she has a LC O/P scheduled on 1/21 3:30 pm.    Maternal Data Has patient been taught Hand Expression?: Yes Does the patient have breastfeeding experience prior to this delivery?: Yes  Feeding Mother's Current Feeding Choice: Breast Milk and Formula Nipple Type: Slow - flow  LATCH Score - Latch score 9     Lactation Tools Discussed/Used Tools: Pump;Flanges Flange Size: 18 Breast pump type: Double-Electric Breast Pump Pump Education: Setup, frequency, and cleaning;Milk Storage Pumped volume:  (per mom not getting any milk yet)  Interventions  Education   Discharge Discharge Education: Engorgement and breast care;Warning signs for feeding baby;Outpatient recommendation;Other (comment) (LC O/P scheduled it for 1/21 3:30 pm) Pump: Hands Free;Personal  Consult Status Consult Status: Complete Date: 02/13/24    Rollene Caldron Latanga Nedrow 02/13/2024, 1:28 PM

## 2024-02-14 LAB — BPAM RBC
Blood Product Expiration Date: 202602012359
Blood Product Expiration Date: 202602022359
Unit Type and Rh: 9500
Unit Type and Rh: 9500

## 2024-02-14 LAB — TYPE AND SCREEN
ABO/RH(D): O NEG
Antibody Screen: POSITIVE
Unit division: 0
Unit division: 0

## 2024-02-21 ENCOUNTER — Telehealth (HOSPITAL_COMMUNITY): Payer: Self-pay | Admitting: *Deleted

## 2024-02-21 NOTE — Telephone Encounter (Signed)
 Attempted hospital discharge follow-up call. Message received stating, Voicemail has not been set up. Allean IVAR Carton, RN, 02/21/24, [phone]

## 2024-03-18 ENCOUNTER — Ambulatory Visit: Payer: Self-pay | Admitting: Advanced Practice Midwife
